# Patient Record
Sex: Male | Born: 1965 | Race: White | Hispanic: No | Marital: Married | Smoking: Never smoker
Health system: Southern US, Community
[De-identification: ages and names within clinical notes are randomized; demographics above are authoritative.]

## PROBLEM LIST (undated history)

## (undated) DIAGNOSIS — I639 Cerebral infarction, unspecified: Secondary | ICD-10-CM

## (undated) DIAGNOSIS — I10 Essential (primary) hypertension: Secondary | ICD-10-CM

## (undated) HISTORY — DX: Cerebral infarction, unspecified: I63.9

## (undated) HISTORY — PX: TYMPANOSTOMY TUBE PLACEMENT: SHX32

---

## 2020-12-17 ENCOUNTER — Encounter (HOSPITAL_BASED_OUTPATIENT_CLINIC_OR_DEPARTMENT_OTHER): Payer: Self-pay | Admitting: *Deleted

## 2020-12-17 ENCOUNTER — Other Ambulatory Visit: Payer: Self-pay

## 2020-12-17 ENCOUNTER — Emergency Department (HOSPITAL_BASED_OUTPATIENT_CLINIC_OR_DEPARTMENT_OTHER): Payer: 59

## 2020-12-17 ENCOUNTER — Emergency Department (HOSPITAL_BASED_OUTPATIENT_CLINIC_OR_DEPARTMENT_OTHER)
Admission: EM | Admit: 2020-12-17 | Discharge: 2020-12-17 | Disposition: A | Discharge status: Home / Self Care | Payer: 59 | Attending: Emergency Medicine | Admitting: Emergency Medicine

## 2020-12-17 DIAGNOSIS — R030 Elevated blood-pressure reading, without diagnosis of hypertension: Secondary | ICD-10-CM | POA: Diagnosis not present

## 2020-12-17 DIAGNOSIS — S161XXA Strain of muscle, fascia and tendon at neck level, initial encounter: Secondary | ICD-10-CM | POA: Diagnosis not present

## 2020-12-17 DIAGNOSIS — S199XXA Unspecified injury of neck, initial encounter: Secondary | ICD-10-CM | POA: Diagnosis present

## 2020-12-17 DIAGNOSIS — S161XXD Strain of muscle, fascia and tendon at neck level, subsequent encounter: Secondary | ICD-10-CM

## 2020-12-17 DIAGNOSIS — Y92410 Unspecified street and highway as the place of occurrence of the external cause: Secondary | ICD-10-CM | POA: Diagnosis not present
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
## 2020-12-17 NOTE — ED Notes (Signed)
Patient transported to CT 
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
## 2021-06-15 ENCOUNTER — Emergency Department (HOSPITAL_COMMUNITY): Payer: 59

## 2021-06-15 ENCOUNTER — Emergency Department (HOSPITAL_COMMUNITY)
Admission: EM | Admit: 2021-06-15 | Discharge: 2021-06-15 | Disposition: A | Discharge status: Other Acute Inpatient Hospital | Payer: 59 | Source: Home / Self Care | Attending: Emergency Medicine | Admitting: Emergency Medicine

## 2021-06-15 ENCOUNTER — Inpatient Hospital Stay (HOSPITAL_COMMUNITY)
Admission: EM | Admit: 2021-06-15 | Discharge: 2021-06-19 | DRG: 064 | Disposition: A | Discharge status: Rehabilitation Facility | Payer: 59 | Attending: Internal Medicine | Admitting: Internal Medicine

## 2021-06-15 ENCOUNTER — Inpatient Hospital Stay (HOSPITAL_COMMUNITY): Payer: 59

## 2021-06-15 ENCOUNTER — Encounter (HOSPITAL_COMMUNITY): Payer: Self-pay | Admitting: Neurology

## 2021-06-15 ENCOUNTER — Other Ambulatory Visit: Payer: Self-pay

## 2021-06-15 DIAGNOSIS — Z20822 Contact with and (suspected) exposure to covid-19: Secondary | ICD-10-CM | POA: Diagnosis present

## 2021-06-15 DIAGNOSIS — I639 Cerebral infarction, unspecified: Secondary | ICD-10-CM | POA: Insufficient documentation

## 2021-06-15 DIAGNOSIS — I6389 Other cerebral infarction: Secondary | ICD-10-CM

## 2021-06-15 DIAGNOSIS — R000 Tachycardia, unspecified: Secondary | ICD-10-CM | POA: Diagnosis present

## 2021-06-15 DIAGNOSIS — E876 Hypokalemia: Secondary | ICD-10-CM | POA: Diagnosis present

## 2021-06-15 DIAGNOSIS — N179 Acute kidney failure, unspecified: Secondary | ICD-10-CM | POA: Diagnosis not present

## 2021-06-15 DIAGNOSIS — I161 Hypertensive emergency: Secondary | ICD-10-CM | POA: Diagnosis present

## 2021-06-15 DIAGNOSIS — Z9114 Patient's other noncompliance with medication regimen: Secondary | ICD-10-CM | POA: Diagnosis not present

## 2021-06-15 DIAGNOSIS — D72829 Elevated white blood cell count, unspecified: Secondary | ICD-10-CM | POA: Diagnosis present

## 2021-06-15 DIAGNOSIS — Z5321 Procedure and treatment not carried out due to patient leaving prior to being seen by health care provider: Secondary | ICD-10-CM | POA: Insufficient documentation

## 2021-06-15 DIAGNOSIS — G936 Cerebral edema: Secondary | ICD-10-CM

## 2021-06-15 DIAGNOSIS — G479 Sleep disorder, unspecified: Secondary | ICD-10-CM | POA: Diagnosis not present

## 2021-06-15 DIAGNOSIS — H5347 Heteronymous bilateral field defects: Secondary | ICD-10-CM | POA: Diagnosis not present

## 2021-06-15 DIAGNOSIS — E7800 Pure hypercholesterolemia, unspecified: Secondary | ICD-10-CM | POA: Diagnosis not present

## 2021-06-15 DIAGNOSIS — E785 Hyperlipidemia, unspecified: Secondary | ICD-10-CM | POA: Diagnosis present

## 2021-06-15 DIAGNOSIS — I629 Nontraumatic intracranial hemorrhage, unspecified: Secondary | ICD-10-CM

## 2021-06-15 DIAGNOSIS — G8191 Hemiplegia, unspecified affecting right dominant side: Secondary | ICD-10-CM | POA: Diagnosis present

## 2021-06-15 DIAGNOSIS — R471 Dysarthria and anarthria: Secondary | ICD-10-CM | POA: Diagnosis present

## 2021-06-15 DIAGNOSIS — R4701 Aphasia: Secondary | ICD-10-CM | POA: Diagnosis present

## 2021-06-15 DIAGNOSIS — Z91199 Patient's noncompliance with other medical treatment and regimen due to unspecified reason: Secondary | ICD-10-CM | POA: Diagnosis not present

## 2021-06-15 DIAGNOSIS — R29711 NIHSS score 11: Secondary | ICD-10-CM | POA: Diagnosis present

## 2021-06-15 DIAGNOSIS — I619 Nontraumatic intracerebral hemorrhage, unspecified: Secondary | ICD-10-CM

## 2021-06-15 DIAGNOSIS — I10 Essential (primary) hypertension: Secondary | ICD-10-CM

## 2021-06-15 DIAGNOSIS — R531 Weakness: Secondary | ICD-10-CM

## 2021-06-15 DIAGNOSIS — I69251 Hemiplegia and hemiparesis following other nontraumatic intracranial hemorrhage affecting right dominant side: Secondary | ICD-10-CM | POA: Diagnosis not present

## 2021-06-15 DIAGNOSIS — I610 Nontraumatic intracerebral hemorrhage in hemisphere, subcortical: Secondary | ICD-10-CM | POA: Diagnosis present

## 2021-06-15 HISTORY — DX: Essential (primary) hypertension: I10

## 2021-06-15 LAB — COMPREHENSIVE METABOLIC PANEL
ALT: 20 U/L (ref 0–44)
ALT: 21 U/L (ref 0–44)
AST: 24 U/L (ref 15–41)
AST: 20 U/L (ref 15–41)
Albumin: 4.2 g/dL (ref 3.5–5.0)
Albumin: 3.8 g/dL (ref 3.5–5.0)
Alkaline Phosphatase: 64 U/L (ref 38–126)
Alkaline Phosphatase: 52 U/L (ref 38–126)
Anion gap: 11 (ref 5–15)
Anion gap: 10 (ref 5–15)
BUN: 15 mg/dL (ref 6–20)
BUN: 14 mg/dL (ref 6–20)
CO2: 22 mmol/L (ref 22–32)
CO2: 22 mmol/L (ref 22–32)
Calcium: 8.6 mg/dL — ABNORMAL LOW (ref 8.9–10.3)
Calcium: 8.2 mg/dL — ABNORMAL LOW (ref 8.9–10.3)
Chloride: 107 mmol/L (ref 98–111)
Chloride: 107 mmol/L (ref 98–111)
Creatinine, Ser: 1.27 mg/dL — ABNORMAL HIGH (ref 0.61–1.24)
Creatinine, Ser: 1.14 mg/dL (ref 0.61–1.24)
GFR, Estimated: >60 mL/min (ref >60)
GFR, Estimated: >60 mL/min (ref >60)
Glucose, Bld: 128 mg/dL — ABNORMAL HIGH (ref 70–99)
Glucose, Bld: 123 mg/dL — ABNORMAL HIGH (ref 70–99)
Potassium: 3.3 mmol/L — ABNORMAL LOW (ref 3.5–5.1)
Potassium: 3.5 mmol/L (ref 3.5–5.1)
Sodium: 140 mmol/L (ref 135–145)
Sodium: 139 mmol/L (ref 135–145)
Total Bilirubin: 1.2 mg/dL (ref 0.3–1.2)
Total Bilirubin: 1.1 mg/dL (ref 0.3–1.2)
Total Protein: 7.1 g/dL (ref 6.5–8.1)
Total Protein: 6.5 g/dL (ref 6.5–8.1)

## 2021-06-15 LAB — I-STAT CHEM 8, ED
BUN: 16 mg/dL (ref 6–20)
BUN: 16 mg/dL (ref 6–20)
Calcium, Ion: 1.03 mmol/L — ABNORMAL LOW (ref 1.15–1.40)
Calcium, Ion: 1.03 mmol/L — ABNORMAL LOW (ref 1.15–1.40)
Chloride: 105 mmol/L (ref 98–111)
Chloride: 105 mmol/L (ref 98–111)
Creatinine, Ser: 1.1 mg/dL (ref 0.61–1.24)
Creatinine, Ser: 1 mg/dL (ref 0.61–1.24)
Glucose, Bld: 126 mg/dL — ABNORMAL HIGH (ref 70–99)
Glucose, Bld: 122 mg/dL — ABNORMAL HIGH (ref 70–99)
HCT: 48 % (ref 39.0–52.0)
HCT: 45 % (ref 39.0–52.0)
Hemoglobin: 16.3 g/dL (ref 13.0–17.0)
Hemoglobin: 15.3 g/dL (ref 13.0–17.0)
Potassium: 3.1 mmol/L — ABNORMAL LOW (ref 3.5–5.1)
Potassium: 3.3 mmol/L — ABNORMAL LOW (ref 3.5–5.1)
Sodium: 140 mmol/L (ref 135–145)
Sodium: 140 mmol/L (ref 135–145)
TCO2: 24 mmol/L (ref 22–32)
TCO2: 25 mmol/L (ref 22–32)

## 2021-06-15 LAB — DIFFERENTIAL
Abs Immature Granulocytes: 0.05 10*3/uL (ref 0.00–0.07)
Abs Immature Granulocytes: 0.03 10*3/uL (ref 0.00–0.07)
Basophils Absolute: 0.1 10*3/uL (ref 0.0–0.1)
Basophils Absolute: 0 10*3/uL (ref 0.0–0.1)
Basophils Relative: 0 %
Basophils Relative: 0 %
Eosinophils Absolute: 0.1 10*3/uL (ref 0.0–0.5)
Eosinophils Absolute: 0.1 10*3/uL (ref 0.0–0.5)
Eosinophils Relative: 1 %
Eosinophils Relative: 1 %
Immature Granulocytes: 0 %
Immature Granulocytes: 0 %
Lymphocytes Relative: 18 %
Lymphocytes Relative: 11 %
Lymphs Abs: 2.5 10*3/uL (ref 0.7–4.0)
Lymphs Abs: 1.1 10*3/uL (ref 0.7–4.0)
Monocytes Absolute: 1.2 10*3/uL — ABNORMAL HIGH (ref 0.1–1.0)
Monocytes Absolute: 0.8 10*3/uL (ref 0.1–1.0)
Monocytes Relative: 9 %
Monocytes Relative: 8 %
Neutro Abs: 10 10*3/uL — ABNORMAL HIGH (ref 1.7–7.7)
Neutro Abs: 7.7 10*3/uL (ref 1.7–7.7)
Neutrophils Relative %: 72 %
Neutrophils Relative %: 80 %

## 2021-06-15 LAB — TROPONIN I (HIGH SENSITIVITY)
Troponin I (High Sensitivity): 6 ng/L (ref <18)
Troponin I (High Sensitivity): 12 ng/L (ref <18)

## 2021-06-15 LAB — TYPE AND SCREEN
ABO/RH(D): O POS
Antibody Screen: NEGATIVE

## 2021-06-15 LAB — URINALYSIS, ROUTINE W REFLEX MICROSCOPIC
Bilirubin Urine: NEGATIVE
Glucose, UA: NEGATIVE mg/dL
Hgb urine dipstick: NEGATIVE
Ketones, ur: NEGATIVE mg/dL
Leukocytes,Ua: NEGATIVE
Nitrite: NEGATIVE
Protein, ur: NEGATIVE mg/dL
Specific Gravity, Urine: 1.01 (ref 1.005–1.030)
pH: 6.5 (ref 5.0–8.0)

## 2021-06-15 LAB — APTT
aPTT: 23 seconds — ABNORMAL LOW (ref 24–36)
aPTT: 24 seconds (ref 24–36)

## 2021-06-15 LAB — CBC
HCT: 48.1 % (ref 39.0–52.0)
HCT: 45 % (ref 39.0–52.0)
Hemoglobin: 16.2 g/dL (ref 13.0–17.0)
Hemoglobin: 15.2 g/dL (ref 13.0–17.0)
MCH: 29.6 pg (ref 26.0–34.0)
MCH: 29.7 pg (ref 26.0–34.0)
MCHC: 33.7 g/dL (ref 30.0–36.0)
MCHC: 33.8 g/dL (ref 30.0–36.0)
MCV: 87.8 fL (ref 80.0–100.0)
MCV: 87.9 fL (ref 80.0–100.0)
Platelets: 208 10*3/uL (ref 150–400)
Platelets: 160 10*3/uL (ref 150–400)
RBC: 5.48 MIL/uL (ref 4.22–5.81)
RBC: 5.12 MIL/uL (ref 4.22–5.81)
RDW: 12.9 % (ref 11.5–15.5)
RDW: 12.9 % (ref 11.5–15.5)
WBC: 13.8 10*3/uL — ABNORMAL HIGH (ref 4.0–10.5)
WBC: 9.7 10*3/uL (ref 4.0–10.5)
nRBC: 0 % (ref 0.0–0.2)
nRBC: 0 % (ref 0.0–0.2)

## 2021-06-15 LAB — HEMOGLOBIN A1C
Hgb A1c MFr Bld: 5.7 % — ABNORMAL HIGH (ref 4.8–5.6)
Mean Plasma Glucose: 116.89 mg/dL

## 2021-06-15 LAB — MAGNESIUM
Magnesium: 2 mg/dL (ref 1.7–2.4)
Magnesium: 2 mg/dL (ref 1.7–2.4)

## 2021-06-15 LAB — RAPID URINE DRUG SCREEN, HOSP PERFORMED
Amphetamines: NOT DETECTED
Barbiturates: NOT DETECTED
Benzodiazepines: NOT DETECTED
Cocaine: NOT DETECTED
Opiates: NOT DETECTED
Tetrahydrocannabinol: NOT DETECTED

## 2021-06-15 LAB — ECHOCARDIOGRAM COMPLETE
AR max vel: 2.79 cm2
AV Area VTI: 2.75 cm2
AV Area mean vel: 2.72 cm2
AV Mean grad: 6 mmHg
AV Peak grad: 10.1 mmHg
Ao pk vel: 1.59 m/s

## 2021-06-15 LAB — LIPID PANEL
Cholesterol: 173 mg/dL (ref 0–200)
HDL: 36 mg/dL — ABNORMAL LOW (ref >40)
LDL Cholesterol: 103 mg/dL — ABNORMAL HIGH (ref 0–99)
Total CHOL/HDL Ratio: 4.8 RATIO
Triglycerides: 170 mg/dL — ABNORMAL HIGH (ref <150)
VLDL: 34 mg/dL (ref 0–40)

## 2021-06-15 LAB — MRSA NEXT GEN BY PCR, NASAL: MRSA by PCR Next Gen: NOT DETECTED

## 2021-06-15 LAB — PROTIME-INR
INR: 0.9 (ref 0.8–1.2)
INR: 1 (ref 0.8–1.2)
Prothrombin Time: 12.6 seconds (ref 11.4–15.2)
Prothrombin Time: 13.3 seconds (ref 11.4–15.2)

## 2021-06-15 LAB — ABO/RH: ABO/RH(D): O POS

## 2021-06-15 LAB — SODIUM: Sodium: 138 mmol/L (ref 135–145)

## 2021-06-15 LAB — RESP PANEL BY RT-PCR (FLU A&B, COVID) ARPGX2
Influenza A by PCR: NEGATIVE
Influenza B by PCR: NEGATIVE
SARS Coronavirus 2 by RT PCR: NEGATIVE

## 2021-06-15 LAB — PHOSPHORUS
Phosphorus: 1.5 mg/dL — ABNORMAL LOW (ref 2.5–4.6)
Phosphorus: 1.4 mg/dL — ABNORMAL LOW (ref 2.5–4.6)

## 2021-06-15 LAB — HIV ANTIBODY (ROUTINE TESTING W REFLEX): HIV Screen 4th Generation wRfx: NONREACTIVE

## 2021-06-15 LAB — ETHANOL: Alcohol, Ethyl (B): <10 mg/dL (ref <10)

## 2021-06-15 LAB — CBG MONITORING, ED: Glucose-Capillary: 127 mg/dL — ABNORMAL HIGH (ref 70–99)

## 2021-06-15 MED ORDER — STROKE: EARLY STAGES OF RECOVERY BOOK
Freq: Once | Status: AC
  Filled 2021-06-15: qty 1

## 2021-06-15 MED ORDER — ACETAMINOPHEN 325 MG PO TABS
650.0000 mg | ORAL_TABLET | ORAL | Status: DC | PRN
  Administered 2021-06-17 – 2021-06-18 (×2): 650 mg via ORAL
  Filled 2021-06-15 (×2): qty 2

## 2021-06-15 MED ORDER — CLEVIDIPINE BUTYRATE 0.5 MG/ML IV EMUL
0.0000 mg/h | INTRAVENOUS | Status: DC
  Administered 2021-06-15: 14:00:00 1 mg/h via INTRAVENOUS
  Filled 2021-06-15: qty 50

## 2021-06-15 MED ORDER — CLEVIDIPINE BUTYRATE 0.5 MG/ML IV EMUL
0.0000 mg/h | INTRAVENOUS | Status: DC
  Administered 2021-06-15: 23:00:00 21 mg/h via INTRAVENOUS
  Administered 2021-06-15 (×3): 20 mg/h via INTRAVENOUS
  Administered 2021-06-15: 13:00:00 2 mg/h via INTRAVENOUS
  Administered 2021-06-15: 23:00:00 21 mg/h via INTRAVENOUS
  Administered 2021-06-15: 14:00:00 8 mg/h via INTRAVENOUS
  Administered 2021-06-16 (×3): 25 mg/h via INTRAVENOUS
  Administered 2021-06-16: 13:00:00 24 mg/h via INTRAVENOUS
  Administered 2021-06-16: 04:00:00 18 mg/h via INTRAVENOUS
  Administered 2021-06-16: 01:00:00 26 mg/h via INTRAVENOUS
  Administered 2021-06-16: 15:00:00 16 mg/h via INTRAVENOUS
  Administered 2021-06-16: 02:00:00 26 mg/h via INTRAVENOUS
  Administered 2021-06-17: 09:00:00 10 mg/h via INTRAVENOUS
  Filled 2021-06-15 (×3): qty 100
  Filled 2021-06-15: qty 50
  Filled 2021-06-15 (×2): qty 100
  Filled 2021-06-15: qty 50
  Filled 2021-06-15 (×2): qty 100
  Filled 2021-06-15 (×2): qty 50
  Filled 2021-06-15 (×3): qty 100
  Filled 2021-06-15: qty 50

## 2021-06-15 MED ORDER — LABETALOL HCL 5 MG/ML IV SOLN
20.0000 mg | Freq: Once | INTRAVENOUS | Status: AC
  Administered 2021-06-15: 13:00:00 20 mg via INTRAVENOUS

## 2021-06-15 MED ORDER — SENNOSIDES-DOCUSATE SODIUM 8.6-50 MG PO TABS
1.0000 | ORAL_TABLET | Freq: Two times a day (BID) | ORAL | Status: DC
  Administered 2021-06-16 – 2021-06-19 (×7): 1 via ORAL
  Filled 2021-06-15 (×8): qty 1

## 2021-06-15 MED ORDER — SODIUM CHLORIDE 3 % IV BOLUS: 250.0000 mL | Freq: Once | INTRAVENOUS | Status: DC

## 2021-06-15 MED ORDER — SODIUM CHLORIDE 3 % IV SOLN
INTRAVENOUS | Status: DC
  Filled 2021-06-15 (×5): qty 500

## 2021-06-15 MED ORDER — CHLORHEXIDINE GLUCONATE CLOTH 2 % EX PADS
6.0000 | MEDICATED_PAD | Freq: Every day | CUTANEOUS | Status: DC
  Administered 2021-06-15 – 2021-06-19 (×4): 6 via TOPICAL

## 2021-06-15 MED ORDER — SODIUM CHLORIDE 3 % IV SOLN
INTRAVENOUS | Status: DC
  Filled 2021-06-15: qty 500

## 2021-06-15 MED ORDER — ACETAMINOPHEN 160 MG/5ML PO SOLN: 650.0000 mg | ORAL | Status: DC | PRN

## 2021-06-15 MED ORDER — IOHEXOL 350 MG/ML SOLN
50.0000 mL | Freq: Once | INTRAVENOUS | Status: AC | PRN
  Administered 2021-06-15: 14:00:00 50 mL via INTRAVENOUS

## 2021-06-15 MED ORDER — ACETAMINOPHEN 650 MG RE SUPP: 650.0000 mg | RECTAL | Status: DC | PRN

## 2021-06-15 MED ORDER — PANTOPRAZOLE SODIUM 40 MG IV SOLR
40.0000 mg | Freq: Every day | INTRAVENOUS | Status: DC
  Administered 2021-06-15: 23:00:00 40 mg via INTRAVENOUS
  Filled 2021-06-15: qty 40

## 2021-06-15 MED ORDER — LABETALOL HCL 5 MG/ML IV SOLN: 20.0000 mg | Freq: Once | INTRAVENOUS | Status: DC

## 2021-06-15 MED ORDER — SODIUM CHLORIDE 0.9% FLUSH: 3.0000 mL | Freq: Once | INTRAVENOUS | Status: DC

## 2021-06-15 MED ORDER — SODIUM CHLORIDE 3 % IV BOLUS
250.0000 mL | Freq: Once | INTRAVENOUS | Status: AC
  Administered 2021-06-15: 14:00:00 250 mL via INTRAVENOUS
  Filled 2021-06-15: qty 500

## 2021-06-15 NOTE — ED Notes (Signed)
Lab called and updated that pts name was wrong and new labs sent

## 2021-06-15 NOTE — ED Provider Notes (Signed)
[place] EMERGENCY DEPARTMENT Provider Note   CSN: [id number] Arrival date & time: 06/15/21  1312  An emergency department physician performed an initial assessment on this suspected stroke patient at 1314.  History Chief Complaint  Patient presents with   Code Stroke    Wayne Mendez is a 55 y.o. male.  Right-sided weakness, slurred speech, visual problems just prior to arrival.  Blood pressure 240/110 with EMS.  History of hypertension but does not take medicine.  Code stroke upon arrival.  The history is provided by the patient, the EMS personnel and a relative.  Neurologic Problem This is a new problem. The current episode started 1 to 2 hours ago. The problem occurs constantly. The problem has not changed since onset.Pertinent negatives include no chest pain, no abdominal pain, no headaches and no shortness of breath. Nothing aggravates the symptoms. Nothing relieves the symptoms. He has tried nothing for the symptoms. The treatment provided no relief.      No past medical history on file.  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  Social History   Tobacco Use   Smoking status: Never   Smokeless tobacco: Never  Substance Use Topics   Alcohol use: Yes    Comment: rare   Drug use: Not Currently    Home Medications Prior to Admission medications   Medication Sig Start Date End Date Taking? Authorizing Provider  ibuprofen (ADVIL) 200 MG tablet Take 400 mg by mouth every 6 (six) hours as needed for fever, headache or mild pain.   Yes [provider]  pseudoephedrine (SUDAFED) 120 MG 12 hr tablet Take 120 mg by mouth 2 (two) times daily as needed for congestion.   Yes [provider]    Allergies    Patient has no known allergies.  Review of Systems    Review of Systems  Constitutional:  Negative for chills and fever.  HENT:  Negative for ear pain and sore throat.   Eyes:  Positive for visual disturbance. Negative for pain.  Respiratory:  Negative for cough and shortness of breath.   Cardiovascular:  Negative for chest pain and palpitations.  Gastrointestinal:  Negative for abdominal pain and vomiting.  Genitourinary:  Negative for dysuria and hematuria.  Musculoskeletal:  Negative for arthralgias and back pain.  Skin:  Negative for color change and rash.  Neurological:  Positive for facial asymmetry, speech difficulty and weakness. Negative for dizziness, seizures, syncope, light-headedness and headaches.  All other systems reviewed and are negative.  Physical Exam Updated Vital Signs BP (!) 141/92    Pulse (!) 102    Temp 98 F (36.7 C) (Oral)    Resp 16    SpO2 97%   Physical Exam Vitals and nursing note reviewed.  Constitutional:      General: He is in acute distress.     Appearance: He is well-developed. He is not ill-appearing.  HENT:     Head: Normocephalic and atraumatic.  Eyes:     Conjunctiva/sclera: Conjunctivae normal.     Pupils: Pupils are equal, round, and reactive to light.  Cardiovascular:     Rate and Rhythm: Normal rate and regular rhythm.     Pulses: Normal pulses.     Heart sounds: Normal heart sounds. No murmur heard. Pulmonary:  Effort: Pulmonary effort is normal. No respiratory distress.     Breath sounds: Normal breath sounds.  Abdominal:     Palpations: Abdomen is soft.     Tenderness: There is no abdominal tenderness.  Musculoskeletal:        General: No swelling.     Cervical back: Normal range of motion and neck supple.  Skin:    General: Skin is warm and dry.     Capillary Refill: Capillary refill takes less than 2 seconds.  Neurological:     Mental Status: He is alert.     Comments: 5+ out of 5 strength in left upper and left lower extremity, slurred speech, right-sided facial droop,  cannot lift his right lower right upper leg up against gravity, suspect right-sided neglect, right-sided hemianopia  Psychiatric:        Mood and Affect: Mood normal.    ED Results / Procedures / Treatments   Labs (all labs ordered are listed, but only abnormal results are displayed) Labs Reviewed  I-STAT CHEM 8, ED - Abnormal; Notable for the following components:      Result Value   Potassium 3.3 (*)    Glucose, Bld 122 (*)    Calcium, Ion 1.03 (*)    All other components within normal limits  RESP PANEL BY RT-PCR (FLU A&B, COVID) ARPGX2  PROTIME-INR  APTT  CBC  DIFFERENTIAL  SODIUM  SODIUM  ETHANOL  COMPREHENSIVE METABOLIC PANEL  RAPID URINE DRUG SCREEN, HOSP PERFORMED  URINALYSIS, ROUTINE W REFLEX MICROSCOPIC  MAGNESIUM  PHOSPHORUS  LIPID PANEL  HEMOGLOBIN A1C  HIV ANTIBODY (ROUTINE TESTING W REFLEX)  MAGNESIUM  PHOSPHORUS  I-STAT CHEM 8, ED  TYPE AND SCREEN  TROPONIN I (HIGH SENSITIVITY)  TROPONIN I (HIGH SENSITIVITY)    EKG None  Radiology No results found.  Procedures .Critical Care Performed by: Virgina Norfolk, DO Authorized by: Virgina Norfolk, DO   Critical care provider statement:    Critical care time (minutes):  45   Critical care was necessary to treat or prevent imminent or life-threatening deterioration of the following conditions:  CNS failure or compromise   Critical care was time spent personally by me on the following activities:  Blood draw for specimens, development of treatment plan with patient or surrogate, discussions with primary provider, evaluation of patient's response to treatment, examination of patient, obtaining history from patient or surrogate, ordering and performing treatments and interventions, ordering and review of laboratory studies, ordering and review of radiographic studies, pulse oximetry, re-evaluation of patient's condition and review of old charts   Care discussed with: admitting provider     Medications Ordered  in ED Medications  labetalol (NORMODYNE) injection 20 mg (20 mg Intravenous Given 06/15/21 1325)    And  clevidipine (CLEVIPREX) infusion 0.5 mg/mL (16 mg/hr Intravenous Infusion Verify 06/15/21 1412)  sodium chloride (hypertonic) 3 % solution ( Intravenous New Bag/Given 06/15/21 1406)  sodium chloride 3% (hypertonic) IV bolus 250 mL (0 mLs Intravenous Stopped 06/15/21 1404)  labetalol (NORMODYNE) injection 20 mg (20 mg Intravenous Not Given 06/15/21 1353)   stroke: mapping our early stages of recovery book (has no administration in time range)  acetaminophen (TYLENOL) tablet 650 mg (has no administration in time range)    Or  acetaminophen (TYLENOL) 160 MG/5ML solution 650 mg (has no administration in time range)    Or  acetaminophen (TYLENOL) suppository 650 mg (has no administration in time range)  pantoprazole (PROTONIX) injection 40 mg (has no administration in time range)  senna-docusate (Senokot-S) tablet 1 tablet (has no administration in time range)    ED Course  I have reviewed the triage vital signs and the nursing notes.  Pertinent labs & imaging results that were available during my care of the patient were reviewed by me and considered in my medical decision making (see chart for details).    MDM Rules/Calculators/A&P                          Wayne Mendez is a 55 year old male with history of hypertension but not on medications who presents the ED as a code stroke.  Blood pressure 240/110 upon arrival.  Per the patient's son patient was at the gym with him when the son received a text from him that did not make sense.  Son found the patient with slurred speech and right-sided weakness.  EMS was called.  He has right-sided weakness, slurred speech, right visual field loss.  Concern for stroke/head bleed.  CT scan was done that shows left-sided head bleed.  No major shift.  CTA did not show ruptured aneurysm.  Patient was started on Cleviprex and given hypertonic saline bolus.   Dr. Theda Sers with neurology will admit the patient to the neurological ICU for further care.  Patient is not on blood thinners.  Family has been made aware of findings.  They are at the bedside.  Lab work is otherwise unremarkable.  Blood pressure improving on Cleviprex drip.  Blood pressure now 140/92.  This chart was dictated using voice recognition software.  Despite best efforts to proofread,  errors can occur which can change the documentation meaning.      Final Clinical Impression(s) / ED Diagnoses Final diagnoses:  Intracranial hemorrhage [facility])  Hypertensive emergency    Rx / DC Orders ED Discharge Orders     None        Lennice Sites, DO 06/15/21 1436

## 2021-06-15 NOTE — ED Triage Notes (Signed)
Pt arrives via EMS at 13:11 under wrong name in epic. Pt code stroke with LSN 11:50 where he was walking into the gym with his son. Pt began having right sided weakness and slurred speech. EMS vitals include: BP 240/110, 94%  Hx of HTN.

## 2021-06-15 NOTE — H&P (Addendum)
Neurology H&P  Wayne Mendez MR# [id number] 06/15/2021  CC: right sided weakness and slurred speech  History is obtained from: EMS and family  HPI: Wayne Mendez is a 55 y.o. male PMHx HTN not taking medications was walking into the gym and suddenly developed right sided weakness and slurred speech.  EMS vitals on the scene: BP 240/110, 94%  LKW: 11:50  tpa given?: No, ICH IR Thrombectomy? No, ICH Modified Rankin Scale: 0-Completely asymptomatic and back to baseline post- stroke NIHSS: 11 LOC Responsiveness 0 LOC Questions 0 LOC Commands 0 Horizontal eye movement 0 Visual field 1 Facial palsy 2 Motor arm - Right arm 3 Motor arm - Left arm 0 Motor leg - Right leg 3 Motor leg - Left leg 0 Limb ataxia 0 Sensory test 0 Language 1 Speech 1 Extinction and inattention 0   ROS: Unable to obtain due to altered mental status.   No family history on file.   Social History:  reports that he has never smoked. He has never used smokeless tobacco. He reports current alcohol use. He reports that he does not currently use drugs.   Prior to Admission medications   Medication Sig Start Date End Date Taking? Authorizing Provider  ibuprofen (ADVIL) 200 MG tablet Take 400 mg by mouth every 6 (six) hours as needed for fever, headache or mild pain.   Yes [provider]  pseudoephedrine (SUDAFED) 120 MG 12 hr tablet Take 120 mg by mouth 2 (two) times daily as needed for congestion.   Yes [provider]    Exam: Current vital signs: BP (!) 150/93    Pulse 92    Temp 98 F (36.7 C) (Oral)    Resp 10    SpO2 93%   Physical Exam  Appears well-developed and well-nourished.  Psych: Affect appropriate to situation Eyes: No scleral injection HENT: No OP obstrucion Head: Normocephalic.  Cardiovascular: Normal rate and regular rhythm.  Respiratory: Effort normal and breath sounds normal to anterior ascultation GI: Soft.  No distension. There is no tenderness.  Skin:  WDI  Neuro: Mental Status: Patient is awake, alert, oriented to person, place, month, year, and situation. Patient is not able to give a clear history. Speech mildly impaired fluency, intact comprehension and impaired repetition. No signs of neglect. Visual Fields right field cut. Pupils are equal, round, and reactive to light. EOMI without ptosis or diploplia.  Facial sensation is symmetric to temperature Facial movement is decreased on right lower.  Hearing is intact to voice. Uvula midline, palate elevates symmetrically. Shoulder shrug is symmetric. Tongue is midline without atrophy or fasciculations.  Tone is normal. Bulk is right extremities 1+/5 strength. Sensation is symmetric to light touch and temperature in the arms and legs. Deep Tendon Reflexes: 2+ and symmetric in the biceps and patellae. Toes are up on right. Gait - Deferred  I have reviewed labs in epic and the pertinent results are: K 3.3  I have reviewed the images obtained: NCT head showed Intraparenchymal hemorrhage in the left basal ganglia and external capsule region, probably a hemorrhagic infarction. Focal hematoma with estimated volume of 23 cc. Mild surrounding edema and mass effect. No intraventricular or subarachnoid penetration. Mild small vessel ischemic changes of the right hemispheric white matter and thalamus. CTA head and neck showed No intracranial large vessel occlusion or stenosis. no aneurysm or vascular malformation.  Assessment: Wayne Mendez is a 55 y.o. male PMHx HTN (untreated) with acute left basal ganglia intraparenchymal hemorrhage.  Impression:  Left  basal ganglia intraparenchymal hemorrhage extending into external capsule. Hypertensive emergency. Acute right sided weakness. Mild aphasia with dysarthria. Right hemianopia. Brain swelling ICH score 0 NIHSS 11   Plan: Elevate head of bed keep head midline.  Blood pressure: MAP >65 SBP 120-160: - Start nicardipine infusion may  increase to maximum 53mcg/min as needed to maintain SBP<140. - Labetalol 20mg  as needed if SBP>140.  3% Na 250cc bolus followed by 75cc/hr infusion - Goal serum Na 145-155. Serum Na every 6 hours.  X-ray chest. Admit to neuro ICU. Consult neurosurgery. Analgosedation (fentanyl). Hold antiplatelets and anticoagulation for now. IV fluids gentle hydration. Repeat CT head in 6 hours (or sooner if clinical worsening). Echocardiogram. MRI brain without contrast when able. Keep platelets >100k, INR<1.4 Replete electrolytes as needed. Labs: Coags, CBC, type and cross, CMP, Mg, Phos, fasting lipids, HbA1c, hCRP, troponins, urinalysis. Maintain O2 sats > 94%. Normothermia - For temperature >37.5C - acetaminophen 650mg  q4-6 hours PRN. Relative euglycemia (~ <180) and treat if hyperglycemia (>200 mg/dL)/hypoglycemia (< 60mg /dL). Euvolemia - Strict I/Os. Precautions: Airway and herniation, seizure, aspiration. PPx: SCDs for now, Senna/docusate, PPI. Precautions: Aspiration/seizure/fall   This patient is critically ill and at significant risk of neurological worsening, death and care requires constant monitoring of vital signs, hemodynamics,respiratory and cardiac monitoring, neurological assessment, discussion with family, other specialists and medical decision making of high complexity. I spent 75 minutes of neurocritical care time  in the care of  this patient. This was time spent independent of any time provided by nurse practitioner or PA.  Electronically signed by:  Lynnae Sandhoff, MD Page: [id number] 06/15/2021, 2:08 PM

## 2021-06-15 NOTE — Code Documentation (Addendum)
Stroke Response Nurse Documentation Code Documentation  Wayne Mendez is a 55 y.o. male arriving to [facility] ED via [facility] on 06/15/21 with past medical hx of hypertension. Does not take any medications. Code stroke was activated by EMS. BP 240/110, HR 98, CBG 93 with EMS.  Patient from the gym where he was LKW at 1100. While at gym, he texted his son but the texts did not make sense. Son was concerned and checked on him around 1150 and called 911. Patient vomited en route. Given Zofran.   Stroke team at the bedside on patient arrival. Labs drawn and patient cleared for CT by Dr. Lockie Mola. Patient to CT with team. NIHSS 18, see documentation for details and code stroke times. Patient with right hemianopia, right facial droop, right arm weakness, right leg weakness, right decreased sensation, Expressive aphasia , dysarthria , and Sensory  neglect on exam.   The following imaging was completed: CT, CTA head and neck. Patient is not a candidate for IV Thrombolytic due to hemorrhage. Elevated BP treated with labetalol followed by Cleviprex drip. Hypertonic saline bolus given.  Care/Plan: Admit to ICU; hourly VS and neuro checks; Cleviprex drip for BP control.   Bedside handoff with ED RN Seward Grater.    Ferman Hamming Stroke Response RN

## 2021-06-15 NOTE — Progress Notes (Signed)
BP 160/100 (116) despite Cleviprex at max rate of 21/hr.  VO from dr Otelia Limes to increase ceiling to 30/hr.

## 2021-06-16 ENCOUNTER — Inpatient Hospital Stay (HOSPITAL_COMMUNITY): Payer: 59

## 2021-06-16 ENCOUNTER — Encounter (HOSPITAL_COMMUNITY): Payer: Self-pay | Admitting: Neurology

## 2021-06-16 LAB — SODIUM
Sodium: 143 mmol/L (ref 135–145)
Sodium: 144 mmol/L (ref 135–145)

## 2021-06-16 MED ORDER — GADOBUTROL 1 MMOL/ML IV SOLN
10.0000 mL | Freq: Once | INTRAVENOUS | Status: AC | PRN
  Administered 2021-06-16: 17:00:00 10 mL via INTRAVENOUS

## 2021-06-16 MED ORDER — POTASSIUM PHOSPHATES 15 MMOLE/5ML IV SOLN
30.0000 mmol | Freq: Once | INTRAVENOUS | Status: AC
  Administered 2021-06-16: 14:00:00 30 mmol via INTRAVENOUS
  Filled 2021-06-16: qty 10

## 2021-06-16 MED ORDER — SODIUM CHLORIDE 3 % IV SOLN: INTRAVENOUS | Status: DC

## 2021-06-16 MED ORDER — METOPROLOL TARTRATE 50 MG PO TABS
50.0000 mg | ORAL_TABLET | Freq: Two times a day (BID) | ORAL | Status: DC
  Administered 2021-06-16 – 2021-06-17 (×2): 50 mg via ORAL
  Filled 2021-06-16 (×3): qty 1

## 2021-06-16 MED ORDER — SODIUM CHLORIDE 0.9 % IV SOLN: INTRAVENOUS | Status: DC

## 2021-06-16 MED ORDER — LISINOPRIL 20 MG PO TABS
20.0000 mg | ORAL_TABLET | Freq: Two times a day (BID) | ORAL | Status: DC
  Administered 2021-06-16 – 2021-06-19 (×7): 20 mg via ORAL
  Filled 2021-06-16 (×8): qty 1

## 2021-06-16 MED ORDER — AMLODIPINE BESYLATE 10 MG PO TABS
10.0000 mg | ORAL_TABLET | Freq: Every day | ORAL | Status: DC
  Administered 2021-06-16 – 2021-06-19 (×4): 10 mg via ORAL
  Filled 2021-06-16 (×5): qty 1

## 2021-06-16 MED ORDER — PANTOPRAZOLE SODIUM 40 MG PO TBEC
40.0000 mg | DELAYED_RELEASE_TABLET | Freq: Every day | ORAL | Status: DC
  Administered 2021-06-16 – 2021-06-19 (×4): 40 mg via ORAL
  Filled 2021-06-16 (×5): qty 1

## 2021-06-16 MED ORDER — WHITE PETROLATUM EX OINT
TOPICAL_OINTMENT | CUTANEOUS | Status: AC
  Filled 2021-06-16: qty 28.35

## 2021-06-16 NOTE — Progress Notes (Signed)
°  Transition of Care [facility]) Screening Note   Patient Details  Name: Wayne Mendez Date of Birth: 05-Apr-1966   Transition of Care [facility]) CM/SW Contact:    Baldemar Lenis, LCSW Phone Number: 06/16/2021, 2:16 PM    Transition of Care Department [facility]) has reviewed patient and no TOC needs have been identified at this time; medical workup ongoing. We will continue to monitor patient advancement through interdisciplinary progression rounds. If new patient transition needs arise, please place a TOC consult.

## 2021-06-16 NOTE — Evaluation (Signed)
Occupational Therapy Evaluation Patient Details Name: Wayne Mendez MRN: [id number] DOB: Feb 21, 1966 Today's Date: 06/16/2021   History of Present Illness Pt is 55 yo male who developed sudden R sided weakness and slurred speech walking into the gym on 06/15/21. 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  PMH: HTN.   Clinical Impression   PTA pt lives independently with his wife and son and works (computers/IT?).  Currently requires Max A +2 for stand pivot transfer and Max A with LB ADL tasks at this time due to below listed deficits. Pt impulsive with decreased awareness of deficits. Excellent candidate for intensive rehab at [facility]. Will follow acutely.      Recommendations for follow up therapy are one component of a multi-disciplinary discharge planning process, led by the attending physician.  Recommendations may be updated based on patient status, additional functional criteria and insurance authorization.   Follow Up Recommendations  Acute inpatient rehab (3hours/day)    Assistance Recommended at Discharge Frequent or constant Supervision/Assistance  Functional Status Assessment  Patient has had a recent decline in their functional status and demonstrates the ability to make significant improvements in function in a reasonable and predictable amount of time.  Equipment Recommendations       Recommendations for Other Services Rehab consult     Precautions / Restrictions Precautions Precautions: Fall Precaution Comments: strict BP under 150 systolic      Mobility Bed Mobility Overal bed mobility: Needs Assistance Bed Mobility: Supine to Sit     Supine to sit: Min assist;+2 for safety/equipment     General bed mobility comments: heavy use of bedreails with HOB elevated; impusively sat up and pulled self to EOB; good problem solving during bed mobility with hos to mobilize to EOB    Transfers Overall transfer level: Needs assistance Equipment used: 2  person hand held assist Transfers: Sit to/from Stand;Bed to chair/wheelchair/BSC Sit to Stand: Mod assist;+2 physical assistance     Step pivot transfers: Max assist;+2 physical assistance (no initiation of moving R leg)            Balance Overall balance assessment: Needs assistance   Sitting balance-Leahy Scale: Poor Sitting balance - Comments: R/posteriero lean at times; unaware of falling R when putting on sock     Standing balance-Leahy Scale: Zero                             ADL either performed or assessed with clinical judgement   ADL Overall ADL's : Needs assistance/impaired Eating/Feeding: Minimal assistance Eating/Feeding Details (indicate cue type and reason): dys 2; honey thick liquids Grooming: Moderate assistance   Upper Body Bathing: Moderate assistance   Lower Body Bathing: Moderate assistance;Bed level;Sitting/lateral leans   Upper Body Dressing : Moderate assistance   Lower Body Dressing: Maximal assistance         Toileting - Clothing Manipulation Details (indicate cue type and reason): using urinal; max A     Functional mobility during ADLs: Maximal assistance;+2 for physical assistance       Vision   Additional Comments: States he does not wear glasses however squinting when trying to read nametag; no over/undershooitng when reaching for targets in all fields; blinks to threat; will further assess     Perception     Praxis      Pertinent Vitals/Pain Pain Assessment: No/denies pain     Hand Dominance Left   Extremity/Trunk Assessment Upper Extremity Assessment Upper Extremity Assessment:  RUE deficits/detail RUE Deficits / Details: flaccid; no movemetn; no response to noxious stimuli; unaware of posiiton of arm during mobility; monitor for need for splinting RUE Sensation: decreased light touch;decreased proprioception RUE Coordination: decreased fine motor;decreased gross motor   Lower Extremity Assessment Lower  Extremity Assessment: Defer to PT evaluation   Cervical / Trunk Assessment Cervical / Trunk Assessment: Other exceptions (R bias)   Communication Communication Communication: Receptive difficulties;Expressive difficulties   Cognition Arousal/Alertness: Awake/alert Behavior During Therapy: Impulsive Overall Cognitive Status: Difficult to assess Area of Impairment: Attention;Safety/judgement;Awareness                   Current Attention Level: Selective     Safety/Judgement: Decreased awareness of safety;Decreased awareness of deficits Awareness: Emergent   General Comments: impulsively trying to stand; responds "no" when asked if it was difficult to get to the chair (required Max A +2; no awareness of RLE buckling; responds "yes" when PT asked if he could walk to the bathroom at this time; Posey seat belt used     General Comments  VSS within parameters - Nsg aware of BP @ 150; Max HR 128    Exercises     Shoulder Instructions      Home Living Family/patient expects to be discharged to:: Private residence Living Arrangements: Spouse/significant other;Children Available Help at Discharge: Available PRN/intermittently;Family Type of Home: House Home Access: Stairs to enter     Home Layout: One level     Bathroom Shower/Tub: Estate manager/land agent Accessibility: Yes   Home Equipment: None      Lives With: Spouse;Son (need to confirm info)    Prior Functioning/Environment Prior Level of Function : Working/employed;Driving;Independent/Modified Independent                        OT Problem List: Decreased strength;Decreased range of motion;Impaired balance (sitting and/or standing);Impaired vision/perception;Decreased coordination;Decreased cognition;Decreased safety awareness;Decreased knowledge of use of DME or AE;Cardiopulmonary status limiting activity;Impaired sensation;Impaired tone;Impaired UE functional use      OT  Treatment/Interventions: Self-care/ADL training;Therapeutic exercise;Neuromuscular education;DME and/or AE instruction;Therapeutic activities;Splinting;Cognitive remediation/compensation;Visual/perceptual remediation/compensation;Patient/family education;Balance training    OT Goals(Current goals can be found in the care plan section) Acute Rehab OT Goals Patient Stated Goal: pt unablet o state at this time OT Goal Formulation: Patient unable to participate in goal setting Time For Goal Achievement: 06/30/21 Potential to Achieve Goals: Good  OT Frequency: Min 2X/week   Barriers to D/C:            Co-evaluation PT/OT/SLP Co-Evaluation/Treatment: Yes Reason for Co-Treatment: Complexity of the patient's impairments (multi-system involvement);For patient/therapist safety;To address functional/ADL transfers   OT goals addressed during session: ADL's and self-care      AM-PAC OT "6 Clicks" Daily Activity     Outcome Measure Help from another person eating meals?: A Lot Help from another person taking care of personal grooming?: A Lot Help from another person toileting, which includes using toliet, bedpan, or urinal?: A Lot Help from another person bathing (including washing, rinsing, drying)?: A Lot Help from another person to put on and taking off regular upper body clothing?: A Lot Help from another person to put on and taking off regular lower body clothing?: A Lot 6 Click Score: 12   End of Session Equipment Utilized During Treatment: Gait belt Nurse Communication: Mobility status;Need for lift equipment Antony Salmon)  Activity Tolerance: Patient tolerated treatment well Patient left: in chair;with call bell/phone within reach;with bed alarm  set (seat belt alarm)  OT Visit Diagnosis: Unsteadiness on feet (R26.81);Other abnormalities of gait and mobility (R26.89);Muscle weakness (generalized) (M62.81);Apraxia (R48.2);Other symptoms and signs involving cognitive function;Cognitive  communication deficit (R41.841);Hemiplegia and hemiparesis Symptoms and signs involving cognitive functions: Nontraumatic intracerebral hemorrhage Hemiplegia - Right/Left: Right Hemiplegia - dominant/non-dominant: Non-Dominant Hemiplegia - caused by: Nontraumatic intracerebral hemorrhage                Time: 1350-1420 OT Time Calculation (min): 30 min Charges:  OT General Charges $OT Visit: 1 Visit OT Evaluation $OT Eval Moderate Complexity: 1 Mod  Siham Bucaro, OT/L   Acute OT Clinical Specialist Acute Rehabilitation Services Pager [phone] Office [phone]   [facility] 06/16/2021, 2:47 PM
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
## 2021-06-16 NOTE — Progress Notes (Signed)
OT Cancellation Note  Patient Details Name: Wayne Mendez MRN: [id number] DOB: Apr 06, 1966   Cancelled Treatment:    Reason Eval/Treat Not Completed: Active bedrest order (Will assess when activity orders updated. thanks)  Burnett Corrente Kayvion Arneson, OT/L   Acute OT Clinical Specialist Acute Rehabilitation Services Pager [phone] Office [phone]  06/16/2021, 8:02 AM

## 2021-06-16 NOTE — Evaluation (Signed)
Speech Language Pathology Evaluation Patient Details Name: Wayne Mendez MRN: [id number] DOB: Mar 24, 1966 Today's Date: 06/16/2021 Time: 1245-8099 SLP Time Calculation (min) (ACUTE ONLY): 38 min  Problem List:  Patient Active Problem List   Diagnosis Date Noted   Stroke, hemorrhagic (HCC) 06/15/2021   Past Medical History:  Past Medical History:  Diagnosis Date   Hypertension    Past Surgical History:  Past Surgical History:  Procedure Laterality Date   TYMPANOSTOMY TUBE PLACEMENT     HPI:  Wayne Mendez is a 55 y.o. male PMHx HTN (untreated) with acute left basal ganglia intraparenchymal hemorrhage.   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 Pt is able to repeat words and short phrases, but make semantic and phonemic errors with full sentence repetition, naming, connected speech. He can make corrections with a verbal cue that he made an error, but often needs a further phonemic or semantic cue. There are then some slight vowel distortions. Pt additionally has receptive errors, poorly accurate with complex Y/N questions and multistep directions. With proximal commands pt has apraxic errors. Pts prosody and rate of speech are also distorted. Pt appears impulsive with functional tasks. Provided some introductory education to family. Recommend [facility] at d/c, pt would greatly benefit from intensive speech therapy as he is highly stimulable and motivated.    SLP Assessment  SLP Recommendation/Assessment: Patient needs continued Speech Lanaguage Pathology Services SLP Visit Diagnosis: Aphasia (R47.01)    Recommendations for follow up therapy are one component of a multi-disciplinary discharge planning process, led by the attending physician.  Recommendations may be updated based on patient status, additional functional criteria and insurance authorization.    Follow Up Recommendations  Acute  inpatient rehab (3hours/day)    Assistance Recommended at Discharge     Functional Status Assessment Patient has had a recent decline in their functional status and demonstrates the ability to make significant improvements in function in a reasonable and predictable amount of time.  Frequency and Duration min 2x/week  2 weeks      SLP Evaluation Cognition  Overall Cognitive Status: Impaired/Different from baseline Arousal/Alertness: Awake/alert Orientation Level: Oriented to person;Disoriented to place;Disoriented to time Attention: Focused;Sustained Focused Attention: Appears intact Sustained Attention: Appears intact Awareness: Impaired Awareness Impairment: Intellectual impairment;Emergent impairment Problem Solving: Impaired Problem Solving Impairment: Functional basic;Verbal basic Safety/Judgment: Impaired       Comprehension  Auditory Comprehension Overall Auditory Comprehension: Impaired Yes/No Questions: Impaired Basic Biographical Questions: 76-100% accurate Basic Immediate Environment Questions: 50-74% accurate Complex Questions: 0-24% accurate Commands: Impaired One Step Basic Commands: 50-74% accurate Multistep Basic Commands: 0-24% accurate Conversation: Simple Reading Comprehension Reading Status: Not tested    Expression Verbal Expression Overall Verbal Expression: Impaired Initiation: No impairment Automatic Speech: Name;Social Response Level of Generative/Spontaneous Verbalization: Phrase;Sentence;Conversation Repetition: Impaired Level of Impairment: Sentence level;Phrase level Naming: Impairment Responsive: Not tested Confrontation: Impaired Convergent: 25-49% accurate Divergent: Not tested Verbal Errors: Neologisms;Phonemic paraphasias;Semantic paraphasias;Not aware of errors Pragmatics: Impairment Impairments: Dysprosody;Abnormal affect   Oral / Motor  Oral Motor/Sensory Function Overall Oral Motor/Sensory Function: Moderate  impairment Facial ROM: Reduced right;Suspected CN VII (facial) dysfunction Facial Symmetry: Abnormal symmetry right;Suspected CN VII (facial) dysfunction Facial Strength: Reduced right;Suspected CN VII (facial) dysfunction Facial Sensation: Reduced right;Suspected CN V (Trigeminal) dysfunction Lingual ROM: Within Functional Limits Lingual Symmetry: Abnormal symmetry right;Suspected CN XII (hypoglossal) dysfunction Lingual Strength: Within Functional Limits Motor Speech Overall Motor Speech: Impaired Respiration: Within functional limits Phonation: Normal Resonance: Within  functional limits Articulation: Impaired Level of Impairment: Word Intelligibility: Intelligible Motor Planning: Impaired Level of Impairment: Word Motor Speech Errors: Unaware;Inconsistent;Groping for words            Wayne Mendez, Wayne Mendez 06/16/2021, 12:04 PM

## 2021-06-16 NOTE — Progress Notes (Signed)
PT Cancellation Note  Patient Details Name: Paula Zietz MRN: [id number] DOB: 04/23/1966   Cancelled Treatment:    Reason Eval/Treat Not Completed: Active bedrest order (will follow when activity orders advanced).  Lyanne Co, PT  Acute Rehab Services  Pager [phone] Office [phone]    Lawana Chambers Geniyah Eischeid 06/16/2021, 8:31 AM

## 2021-06-16 NOTE — Evaluation (Addendum)
Clinical/Bedside Swallow Evaluation Patient Details  Name: Wayne Mendez MRN: [id number] Date of Birth: 22-Jul-1965  Today's Date: 06/16/2021 Time: SLP Start Time (ACUTE ONLY): 1020 SLP Stop Time (ACUTE ONLY): 1058 SLP Time Calculation (min) (ACUTE ONLY): 38 min  Past Medical History:  Past Medical History:  Diagnosis Date   Hypertension    Past Surgical History:  Past Surgical History:  Procedure Laterality Date   TYMPANOSTOMY TUBE PLACEMENT     HPI:  Wayne Mendez is a 55 y.o. male PMHx HTN (untreated) with acute left basal ganglia intraparenchymal hemorrhage.    Assessment / Plan / Recommendation  Clinical Impression  Pt demonstrates signs of dysphagia including right lingual deviation and right facial sensory changes. When given sips of thin liquids pt has immediate coughing with all but very small sips. Pt intially able to tolerate nectar thick liquids without coughing with single sips, but when drinking freely again had immediate and delayed coughing. Pt able to drink honey thick liquids without coughing. SLP will need MBS to determine best diet, but for now can sip on honey thick liquids, soft dys 2 solids and meds whole in puree. SLP will f/u tomorrow with MBS. SLP Visit Diagnosis: Dysphagia, oropharyngeal phase (R13.12)    Aspiration Risk  Moderate aspiration risk    Diet Recommendation Dysphagia 2 (Fine chop);Honey-thick liquid   Liquid Administration via: Cup;Straw Medication Administration: Crushed with puree Supervision: Patient able to self feed Compensations: Slow rate;Small sips/bites Postural Changes: Seated upright at 90 degrees    Other  Recommendations      Recommendations for follow up therapy are one component of a multi-disciplinary discharge planning process, led by the attending physician.  Recommendations may be updated based on patient status, additional functional criteria and insurance authorization.  Follow up Recommendations Acute inpatient rehab  (3hours/day)      Assistance Recommended at Discharge    Functional Status Assessment Patient has had a recent decline in their functional status and demonstrates the ability to make significant improvements in function in a reasonable and predictable amount of time.  Frequency and Duration min 2x/week  2 weeks       Prognosis        Swallow Study   General HPI: Wayne Mendez is a 55 y.o. male PMHx HTN (untreated) with acute left basal ganglia intraparenchymal hemorrhage. Type of Study: Bedside Swallow Evaluation Diet Prior to this Study: NPO Temperature Spikes Noted: No Respiratory Status: Room air History of Recent Intubation: No Behavior/Cognition: Alert;Cooperative;Pleasant mood Self-Feeding Abilities: Needs assist Patient Positioning: Upright in bed Baseline Vocal Quality: Normal Volitional Cough: Strong Volitional Swallow: Able to elicit    Oral/Motor/Sensory Function   Ice Chips     Thin Liquid Thin Liquid: Impaired Presentation: Cup Pharyngeal  Phase Impairments: Cough - Immediate    Nectar Thick Nectar Thick Liquid: Impaired Presentation: Cup Pharyngeal Phase Impairments: Cough - Immediate;Cough - Delayed   Honey Thick Honey Thick Liquid: Within functional limits   Puree Puree: Impaired Presentation: Spoon Oral Phase Functional Implications: Oral residue;Right anterior spillage   Solid     Solid: Impaired Oral Phase Impairments: Reduced lingual movement/coordination Oral Phase Functional Implications: Oral residue      Wayne Mendez, [name] 06/16/2021,11:44 AM

## 2021-06-16 NOTE — Evaluation (Signed)
Physical Therapy Evaluation Patient Details Name: Wayne Mendez MRN: [id number] DOB: 06/21/1966 Today's Date: 06/16/2021  History of Present Illness  Pt is 55 yo male who developed sudden R sided weakness and slurred speech walking into the gym on 06/15/21. 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  PMH: HTN.  Clinical Impression  Pt admitted with above diagnosis. Pt from home where he was independent, working full time, appears to have been very active. Pt very impulsive on eval with decreased awareness of deficits and safety limitations. Pt follows commands when given directly with short phrases and 1 step at a time. Pt able to pull self into long sitting with bedrails and LUE as well as using LUE to move RLE off EOB to sit EOB. No active mvmt of LLE noted on eval and L knee buckling with transfer to chair. Max A +2 needed to pivot to chair with pt maintaining R lean and having difficulty wt shifting to L. Pt excellent candidate for intense inpt rehab environment.   Pt currently with functional limitations due to the deficits listed below (see PT Problem List). Pt will benefit from skilled PT to increase their independence and safety with mobility to allow discharge to the venue listed below.          Recommendations for follow up therapy are one component of a multi-disciplinary discharge planning process, led by the attending physician.  Recommendations may be updated based on patient status, additional functional criteria and insurance authorization.  Follow Up Recommendations Acute inpatient rehab (3hours/day)    Assistance Recommended at Discharge Frequent or constant Supervision/Assistance  Functional Status Assessment Patient has had a recent decline in their functional status and demonstrates the ability to make significant improvements in function in a reasonable and predictable amount of time.  Equipment Recommendations  Other (comment) (TBD)    Recommendations for  Other Services Rehab consult     Precautions / Restrictions Precautions Precautions: Fall Precaution Comments: strict BP under 150 systolic Restrictions Weight Bearing Restrictions: No      Mobility  Bed Mobility Overal bed mobility: Needs Assistance Bed Mobility: Supine to Sit     Supine to sit: Min assist;+2 for safety/equipment     General bed mobility comments: heavy use of bedreails with HOB elevated; impusively sat up and pulled self to EOB; is quickly problem solving to make up for R side not working, grabbing RLE with L hand and moving it off EOB.    Transfers Overall transfer level: Needs assistance Equipment used: 2 person hand held assist Transfers: Sit to/from Stand;Bed to chair/wheelchair/BSC Sit to Stand: Mod assist;+2 physical assistance   Step pivot transfers: Max assist;+2 physical assistance (no initiation of moving R leg)       General transfer comment: R knee buckling, heavy R lean, difficulty wt shifting to L, needed facilitation at hips to move into chair on his L.    Ambulation/Gait               General Gait Details: unable to perform safely at this time  Stairs            Wheelchair Mobility    Modified Rankin (Stroke Patients Only) Modified Rankin (Stroke Patients Only) Pre-Morbid Rankin Score: No symptoms Modified Rankin: Severe disability     Balance Overall balance assessment: Needs assistance Sitting-balance support: Feet supported;Single extremity supported Sitting balance-Leahy Scale: Poor Sitting balance - Comments: R/posteriero lean at times; unaware of falling R when putting on sock   Standing  balance support: Bilateral upper extremity supported;During functional activity Standing balance-Leahy Scale: Zero Standing balance comment: max A +2 to maintain standing                             Pertinent Vitals/Pain Pain Assessment: No/denies pain    Home Living Family/patient expects to be  discharged to:: Private residence Living Arrangements: Spouse/significant other;Children Available Help at Discharge: Available PRN/intermittently;Family Type of Home: House Home Access: Stairs to enter       Home Layout: One level Home Equipment: None Additional Comments: need to confirm info due to pt's cognition and aphasia    Prior Function Prior Level of Function : Working/employed;Driving;Independent/Modified Independent                     Hand Dominance   Dominant Hand: Left    Extremity/Trunk Assessment   Upper Extremity Assessment Upper Extremity Assessment: Defer to OT evaluation RUE Deficits / Details: flaccid; no movemetn; no response to noxious stimuli; unaware of posiiton of arm during mobility RUE Sensation: decreased light touch;decreased proprioception RUE Coordination: decreased fine motor;decreased gross motor    Lower Extremity Assessment Lower Extremity Assessment: RLE deficits/detail RLE Deficits / Details: no noted active mvmt of RLE, R knee buckling in standing, decreased sensation and joint sense noted RLE Sensation: decreased light touch;decreased proprioception RLE Coordination: decreased fine motor;decreased gross motor    Cervical / Trunk Assessment Cervical / Trunk Assessment: Other exceptions (R bias)  Communication   Communication: Receptive difficulties;Expressive difficulties  Cognition Arousal/Alertness: Awake/alert Behavior During Therapy: Impulsive Overall Cognitive Status: Impaired/Different from baseline Area of Impairment: Attention;Safety/judgement;Awareness                   Current Attention Level: Selective     Safety/Judgement: Decreased awareness of safety;Decreased awareness of deficits Awareness: Emergent   General Comments: very impulsive, decreased awareness of deficits and safety. Often times quickly answers "yes" or "no" without that being the appropriate answer but will do better when cued to slow  down and think about the question before answering. Some speech unintelligible        General Comments General comments (skin integrity, edema, etc.): HR 128 bpm with mobility. Maintain BP parameters of SBP 150    Exercises     Assessment/Plan    PT Assessment Patient needs continued PT services  PT Problem List Decreased strength;Decreased mobility;Decreased coordination;Decreased cognition;Decreased balance;Decreased activity tolerance;Decreased range of motion;Decreased knowledge of use of DME;Decreased safety awareness;Decreased knowledge of precautions;Cardiopulmonary status limiting activity;Impaired sensation;Impaired tone       PT Treatment Interventions DME instruction;Gait training;Stair training;Functional mobility training;Therapeutic activities;Therapeutic exercise;Balance training;Neuromuscular re-education;Cognitive remediation;Patient/family education;Wheelchair mobility training    PT Goals (Current goals can be found in the Care Plan section)  Acute Rehab PT Goals Patient Stated Goal: return of independence PT Goal Formulation: With patient Time For Goal Achievement: 06/30/21 Potential to Achieve Goals: Good    Frequency Min 4X/week   Barriers to discharge        Co-evaluation PT/OT/SLP Co-Evaluation/Treatment: Yes Reason for Co-Treatment: Complexity of the patient's impairments (multi-system involvement);Necessary to address cognition/behavior during functional activity;For patient/therapist safety PT goals addressed during session: Mobility/safety with mobility;Balance OT goals addressed during session: ADL's and self-care       AM-PAC PT "6 Clicks" Mobility  Outcome Measure Help needed turning from your back to your side while in a flat bed without using bedrails?: A Little Help needed moving from  lying on your back to sitting on the side of a flat bed without using bedrails?: A Lot Help needed moving to and from a bed to a chair (including a  wheelchair)?: Total Help needed standing up from a chair using your arms (e.g., wheelchair or bedside chair)?: Total Help needed to walk in hospital room?: Total Help needed climbing 3-5 steps with a railing? : Total 6 Click Score: 9    End of Session Equipment Utilized During Treatment: Gait belt Activity Tolerance: Patient tolerated treatment well Patient left: in chair;with chair alarm set;with call bell/phone within reach (posey chair alarm) Nurse Communication: Mobility status PT Visit Diagnosis: Unsteadiness on feet (R26.81);Hemiplegia and hemiparesis;Difficulty in walking, not elsewhere classified (R26.2) Hemiplegia - Right/Left: Right Hemiplegia - dominant/non-dominant: Non-dominant Hemiplegia - caused by: Nontraumatic intracerebral hemorrhage    Time: 1352-1420 PT Time Calculation (min) (ACUTE ONLY): 28 min   Charges:   PT Evaluation $PT Eval Moderate Complexity: 1 Mod          Lyanne Co, PT  Acute Rehab Services  Pager [phone] Office [phone]   Lawana Chambers Deserae Jennings 06/16/2021, 4:49 PM

## 2021-06-16 NOTE — TOC Progression Note (Signed)
Transition of Care [facility]) - Progression Note    Patient Details  Name: Wayne Mendez MRN: [id number] Date of Birth: 04-Oct-1965  [facility] CM/SW Contact  Tom-Johnson, Hershal Coria, RN Phone Number: 06/16/2021, 3:17 PM  Clinical Narrative:     CM consulted for PCP needs. CM spoke with patient and wife, Wayne Mendez at bedside. Wayne Mendez states she was trying to get patient enrolled with her PCP at [facility] but they do not have immediate opening. Requesting a PCP for followup. CM informed Wayne Mendez that PCP offices are not accepting new patients till after out. CM gave her a list of clinics that patient can go for followup while she is waiting to be seen by the PCP at [facility]. Patient agreed for CM to schedule appointment with [facility]. CM called and scheduled followup appointment and information is on AVS. Wayne Mendez stated patient will possible transition to rehab. CM told her they can cancel or reschedule appointment if patient is still in rehab on appointment date. CM will continue to follow with needs.        Expected Discharge Plan and Services                                                 Social Determinants of Health (SDOH) Interventions    Readmission Risk Interventions No flowsheet data found.

## 2021-06-16 NOTE — Progress Notes (Signed)
Inpatient Rehab Admissions Coordinator:  ? ?Per therapy recommendations,  patient was screened for CIR candidacy by Colette Dicamillo, MS, CCC-SLP. At this time, Pt. Appears to be a a potential candidate for CIR. I will place   order for rehab consult per protocol for full assessment. Please contact me any with questions. ? ?Montez Stryker, MS, CCC-SLP ?Rehab Admissions Coordinator  ?[phone] (celll) ?[phone] (office) ? ?

## 2021-06-17 ENCOUNTER — Inpatient Hospital Stay (HOSPITAL_COMMUNITY): Payer: 59

## 2021-06-17 LAB — CBC
HCT: 45.3 % (ref 39.0–52.0)
Hemoglobin: 15.2 g/dL (ref 13.0–17.0)
MCH: 29.5 pg (ref 26.0–34.0)
MCHC: 33.6 g/dL (ref 30.0–36.0)
MCV: 87.8 fL (ref 80.0–100.0)
Platelets: 168 10*3/uL (ref 150–400)
RBC: 5.16 MIL/uL (ref 4.22–5.81)
RDW: 13.8 % (ref 11.5–15.5)
WBC: 13.1 10*3/uL — ABNORMAL HIGH (ref 4.0–10.5)
nRBC: 0 % (ref 0.0–0.2)

## 2021-06-17 LAB — BASIC METABOLIC PANEL
Anion gap: 12 (ref 5–15)
BUN: 8 mg/dL (ref 6–20)
CO2: 22 mmol/L (ref 22–32)
Calcium: 8.4 mg/dL — ABNORMAL LOW (ref 8.9–10.3)
Chloride: 109 mmol/L (ref 98–111)
Creatinine, Ser: 1.01 mg/dL (ref 0.61–1.24)
GFR, Estimated: >60 mL/min (ref >60)
Glucose, Bld: 105 mg/dL — ABNORMAL HIGH (ref 70–99)
Potassium: 3.5 mmol/L (ref 3.5–5.1)
Sodium: 143 mmol/L (ref 135–145)

## 2021-06-17 LAB — PHOSPHORUS: Phosphorus: 2.6 mg/dL (ref 2.5–4.6)

## 2021-06-17 MED ORDER — ALBUTEROL SULFATE (2.5 MG/3ML) 0.083% IN NEBU
INHALATION_SOLUTION | RESPIRATORY_TRACT | Status: AC
  Filled 2021-06-17: qty 6

## 2021-06-17 MED ORDER — METOPROLOL TARTRATE 50 MG PO TABS
75.0000 mg | ORAL_TABLET | Freq: Two times a day (BID) | ORAL | Status: DC
  Administered 2021-06-17 – 2021-06-19 (×4): 75 mg via ORAL
  Filled 2021-06-17 (×4): qty 1

## 2021-06-17 MED ORDER — ALBUTEROL SULFATE (2.5 MG/3ML) 0.083% IN NEBU
INHALATION_SOLUTION | RESPIRATORY_TRACT | Status: AC
  Filled 2021-06-17: qty 3

## 2021-06-17 MED ORDER — LABETALOL HCL 5 MG/ML IV SOLN
10.0000 mg | INTRAVENOUS | Status: DC | PRN
Start: 2021-06-17 — End: 2021-06-19

## 2021-06-17 MED ORDER — HEPARIN SODIUM (PORCINE) 5000 UNIT/ML IJ SOLN
5000.0000 [IU] | Freq: Three times a day (TID) | INTRAMUSCULAR | Status: DC
  Administered 2021-06-17 – 2021-06-19 (×7): 5000 [IU] via SUBCUTANEOUS
  Filled 2021-06-17 (×7): qty 1

## 2021-06-17 MED ORDER — K PHOS MONO-SOD PHOS DI & MONO 155-852-130 MG PO TABS
500.0000 mg | ORAL_TABLET | Freq: Once | ORAL | Status: AC
  Administered 2021-06-17: 16:00:00 500 mg via ORAL
  Filled 2021-06-17 (×2): qty 2

## 2021-06-17 NOTE — Progress Notes (Signed)
Pt transported to [facility]. Updated wife on transfer. Confirmed only personal belongings with patient are orange pumpkin blanket, glasses, and a small personal pillow, with wife.

## 2021-06-17 NOTE — Progress Notes (Addendum)
STROKE TEAM PROGRESS NOTE   INTERVAL HISTORY CIR coordinator at bedside talking to patient regarding CIR placement.  Patient sitting in chair, AAOx3, still have right facial droop and right hemiparesis.  Off Cleviprex, on p.o. BP meds.  Passed swallow, on diet and will DC IV fluid  Vitals:   06/17/21 1200 06/17/21 1215 06/17/21 1600 06/17/21 1824  BP: (!) 148/98 (!) 148/102 (!) 151/104 (!) 154/90  Pulse: 78 75 89 90  Resp: 13 17 16 16   Temp: 98.8 F (37.1 C)  98.4 F (36.9 C) 98.7 F (37.1 C)  TempSrc: Oral  Oral Oral  SpO2: 96% 96% 98% 98%  Weight:      Height:       CBC:  Recent Labs  Lab 06/15/21 1350 06/17/21 0416  WBC 9.7 13.1*  NEUTROABS 7.7  --   HGB 15.2 15.2  HCT 45.0 45.3  MCV 87.9 87.8  PLT 160 168   Basic Metabolic Panel:  Recent Labs  Lab 06/15/21 1350 06/15/21 1420 06/15/21 1420 06/15/21 1602 06/15/21 1815 06/16/21 0717 06/17/21 0416  NA 139  --   --   --    < > 144 143  K 3.5  --   --   --   --   --  3.5  CL 107  --   --   --   --   --  109  CO2 22  --   --   --   --   --  22  GLUCOSE 123*  --   --   --   --   --  105*  BUN 14  --   --   --   --   --  8  CREATININE 1.14  --   --   --   --   --  1.01  CALCIUM 8.2*  --   --   --   --   --  8.4*  MG  --  2.0  --  2.0  --   --   --   PHOS  --  1.5*   < > 1.4*  --   --  2.6   < > = values in this interval not displayed.   Lipid Panel:  Recent Labs  Lab 06/15/21 1420  CHOL 173  TRIG 170*  HDL 36*  CHOLHDL 4.8  VLDL 34  LDLCALC 06/17/21*   HgbA1c:  Recent Labs  Lab 06/15/21 1413  HGBA1C 5.7*   Urine Drug Screen:  Recent Labs  Lab 06/15/21 1357  LABOPIA NONE DETECTED  COCAINSCRNUR NONE DETECTED  LABBENZ NONE DETECTED  AMPHETMU NONE DETECTED  THCU NONE DETECTED  LABBARB NONE DETECTED    Alcohol Level  Recent Labs  Lab 06/15/21 1357  ETH <10    IMAGING past 24 hours DG Swallowing Func-Speech Pathology  Result Date: 06/17/2021 Table formatting from the original result was not  included. Objective Swallowing Evaluation: Type of Study: MBS-Modified Barium Swallow Study  Patient Details Name: Drayson Dorko MRN: Tyson Dense Date of Birth: 04/21/1966 Today's Date: 06/17/2021 Time: SLP Start Time (ACUTE ONLY): 0830 -SLP Stop Time (ACUTE ONLY): 0843 SLP Time Calculation (min) (ACUTE ONLY): 13 min Past Medical History: Past Medical History: Diagnosis Date  Hypertension  Past Surgical History: Past Surgical History: Procedure Laterality Date  TYMPANOSTOMY TUBE PLACEMENT   HPI: Aking Klabunde is a 55 y.o. male PMHx HTN (untreated) with acute left basal ganglia intraparenchymal hemorrhage.  No data recorded  Recommendations for follow up  therapy are one component of a multi-disciplinary discharge planning process, led by the attending physician.  Recommendations may be updated based on patient status, additional functional criteria and insurance authorization. Assessment / Plan / Recommendation Clinical Impressions 06/17/2021 Clinical Impression Pt demosntrates mild sensory impairment leading to delayed swallow initaition with all trials, despite this, even with mixed consistency and pressure to drink consecutively, at worst, pt only had trace flash penetration with thin liquids. No residuals. Pt able to attempt regualr solids and thin liquids. May need reminders for lingual sweep/oral care after meals given sensory changes on the right. Suspect pt may have intermittent cough with intake if he is drinking very quickly.  Will f/u for tolerance. SLP Visit Diagnosis Dysphagia, oropharyngeal phase (R13.12) Attention and concentration deficit following -- Frontal lobe and executive function deficit following -- Impact on safety and function Mild aspiration risk   Treatment Recommendations 06/17/2021 Treatment Recommendations Therapy as outlined in treatment plan below   Prognosis 06/17/2021 Prognosis for Safe Diet Advancement Good Barriers to Reach Goals Language deficits;Cognitive deficits Barriers/Prognosis  Comment -- Diet Recommendations 06/17/2021 SLP Diet Recommendations Regular solids;Thin liquid Liquid Administration via Cup;Straw Medication Administration Whole meds with liquid Compensations Slow rate;Small sips/bites Postural Changes Seated upright at 90 degrees   Other Recommendations 06/17/2021 Recommended Consults -- Oral Care Recommendations Oral care before and after PO Other Recommendations Have oral suction available Follow Up Recommendations Acute inpatient rehab (3hours/day) Assistance recommended at discharge Frequent or constant Supervision/Assistance Functional Status Assessment Patient has had a recent decline in their functional status and demonstrates the ability to make significant improvements in function in a reasonable and predictable amount of time. Frequency and Duration  06/17/2021 Speech Therapy Frequency (ACUTE ONLY) min 2x/week Treatment Duration 2 weeks   Oral Phase 06/17/2021 Oral Phase Impaired Oral - Pudding Teaspoon -- Oral - Pudding Cup -- Oral - Honey Teaspoon -- Oral - Honey Cup -- Oral - Nectar Teaspoon WFL Oral - Nectar Cup Right anterior bolus loss Oral - Nectar Straw Right anterior bolus loss Oral - Thin Teaspoon -- Oral - Thin Cup Right anterior bolus loss Oral - Thin Straw Right anterior bolus loss Oral - Puree WFL Oral - Mech Soft -- Oral - Regular WFL Oral - Multi-Consistency -- Oral - Pill -- Oral Phase - Comment --  Pharyngeal Phase 06/17/2021 Pharyngeal Phase Impaired Pharyngeal- Pudding Teaspoon -- Pharyngeal -- Pharyngeal- Pudding Cup -- Pharyngeal -- Pharyngeal- Honey Teaspoon -- Pharyngeal -- Pharyngeal- Honey Cup -- Pharyngeal -- Pharyngeal- Nectar Teaspoon Delayed swallow initiation-vallecula Pharyngeal -- Pharyngeal- Nectar Cup Delayed swallow initiation-pyriform sinuses Pharyngeal -- Pharyngeal- Nectar Straw Delayed swallow initiation-pyriform sinuses Pharyngeal -- Pharyngeal- Thin Teaspoon -- Pharyngeal -- Pharyngeal- Thin Cup Delayed swallow  initiation-pyriform sinuses;Penetration/Aspiration before swallow Pharyngeal Material enters airway, remains ABOVE vocal cords then ejected out;Material does not enter airway Pharyngeal- Thin Straw Delayed swallow initiation-pyriform sinuses;Penetration/Aspiration before swallow Pharyngeal Material enters airway, remains ABOVE vocal cords then ejected out;Material does not enter airway Pharyngeal- Puree Delayed swallow initiation-vallecula Pharyngeal -- Pharyngeal- Mechanical Soft -- Pharyngeal -- Pharyngeal- Regular Delayed swallow initiation-vallecula Pharyngeal -- Pharyngeal- Multi-consistency -- Pharyngeal -- Pharyngeal- Pill -- Pharyngeal -- Pharyngeal Comment --  No flowsheet data found. DeBlois, [name] 06/17/2021, 10:38 AM                      PHYSICAL EXAM  Temp:  [98.4 F (36.9 C)-98.9 F (37.2 C)] 98.7 F (37.1 C) (12/28 1824) Pulse Rate:  [70-112] 90 (12/28 1824) Resp:  [13-28]  16 (12/28 1824) BP: (116-176)/(80-126) 154/90 (12/28 1824) SpO2:  [89 %-99 %] 98 % (12/28 1824)  General - Well nourished, well developed, in no apparent distress. Ophthalmologic - fundi not visualized due to noncooperation. Cardiovascular - Tachycardic, hypertensive  Mental Status -  Patient is alert and oriented to self and time. He is able to answer other orientation questions with verbal cues and prompting.  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 He is able to repeat words and phrases, but he makes errors with repetition and answering questions.  When asked his son's name he states his name, but is able to correct himself with prompting. Names his wife correctly.   Cranial Nerves II - XII - II - Visual field intact OU.  III, IV, VI - Extraocular movements intact. V - Facial sensation intact bilaterally. VII - Right facial droop VIII - Hearing & vestibular intact bilaterally. X - Palate elevates symmetrically. XI - Head is midline XII - Tongue protrusion  intact.  Motor Strength - Weakness in right extremities, RUA 1/5, RLE 1/5- slight lateral movement  Full strength in left upper and lower extremity  Motor Tone - Muscle tone was assessed at the neck and appendages and was normal. Reflexes - The patients reflexes were symmetrical in all extremities and he had no pathological reflexes. Sensory - No sensation in right arm or right leg Coordination - The patient had normal movements in the left hand with no ataxia or dysmetria.  Tremor was absent. Deferred in Rt hand due to weakness Gait and Station - deferred.   ASSESSMENT/PLAN Mr. Finnbar Cedillos is a 55 y.o. male with history of HTN, non compliant with medications, presenting with right sided weakness and slurred speech. He was walking into the gym when this occurred. EMS was called and his BP on the scene was 240/110. Initial NIH 11. Initial CT scan shows IPH in left basal ganglia and external capsule region. Repeat CT stable and CTA showed no LVO, aneurysm, or vascular malformation. MRI pending. SLP to do swallow and cognitive evaluation today. Sart PO blood pressure medications to wean off cleviprex infusion. Plan to titrate off 3% today and then start NS until adequate PO intake.   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 Focal hematoma with estimated volume of 23 cc. Mild surrounding edema and mass effect.  CTA head & neck -No intracranial large vessel occlusion or stenosis. no aneurysm or vascular malformation. MRI left BG ICH, stable, no mass lesion or enhancement 2D Echo EF 65-70% LDL 103 HgbA1c 5.7 VTE prophylaxis -Heparin subcu No antithrombotic prior to admission, now on No antithrombotic due to IPH Localized cerebral Edema, was on 3% saline - titrate down to discontinue Therapy recommendations: CIR Disposition:  Pending  Hypertensive emergency Home meds:  none, noncompliant Stable now off  cleviprex   BP goal < 160 On llisinopril 20 bid and norvasc 10 on metoprolol 50 bid ->75 bid  Hyperlipidemia Home meds:  None LDL 103, goal < 70 Hold due to IPH Consider statin at discharge  Other Stroke Risk Factors ETOH use, alcohol level <10, advised to drink no more than 2 drink(s) a day Tachycardia resolved after metoprolol addition  Other Active Problems   Hospital day # 2  This patient is critically ill due to ICH, local cerebral edema, hypertensive emergency and at significant risk of neurological worsening, death form brain herniation, hypertensive encephalopathy, seizure, hematoma  expansion. This patient's care requires constant monitoring of vital signs, hemodynamics, respiratory and cardiac monitoring, review of multiple databases, neurological assessment, discussion with family, other specialists and medical decision making of high complexity. I spent 35 minutes of neurocritical care time in the care of this patient.   Marvel Plan, MD PhD Stroke Neurology 06/17/2021 7:45 PM   To contact Stroke Continuity provider, please refer to [email or web address]. After hours, contact General Neurology

## 2021-06-17 NOTE — Progress Notes (Signed)
Inpatient Rehab Admissions Coordinator:   I spoke with Pt. Regarding potential CIR admit. He stated interest and that wife can provide 24/7 support at discharge. I called and left VM with wife to confirm that. I will open a case with his insurance and pursue for potential admission.   Megan Salon, MS, CCC-SLP Rehab Admissions Coordinator  [phone] (celll) [phone] (office)

## 2021-06-17 NOTE — Progress Notes (Signed)
Occupational Therapy Treatment Patient Details Name: Wayne Mendez MRN: [id number] DOB: 05-20-1966 Today's Date: 06/17/2021   History of present illness Pt is 55 yo male who developed sudden R sided weakness and slurred speech walking into the gym on 06/15/21. 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  PMH: HTN.   OT comments  Pt progressed from bed to chair this session with stedy. Pt seems more positive after session and thanking therapist. Pt answering yes no questions 5 out 5 correctly however does benefit from minimal word cueing to process the request. Pt states "pretty cool" after being allowed time at the doorway in the stedy to visualize outside his room into the ICU space. Recommendation for CIR at this time.    Recommendations for follow up therapy are one component of a multi-disciplinary discharge planning process, led by the attending physician.  Recommendations may be updated based on patient status, additional functional criteria and insurance authorization.    Follow Up Recommendations  Acute inpatient rehab (3hours/day)    Assistance Recommended at Discharge Frequent or constant Supervision/Assistance  Equipment Recommendations  BSC/3in1    Recommendations for Other Services Rehab consult    Precautions / Restrictions Precautions Precautions: Fall Precaution Comments: SBP <160 Restrictions Weight Bearing Restrictions: No       Mobility Bed Mobility Overal bed mobility: Needs Assistance Bed Mobility: Supine to Sit     Supine to sit: Min assist;+2 for safety/equipment     General bed mobility comments: Able to use L UE to assist with R LE. Instructed patient on using L LE to hook R LE to assist off bed. MinA to bring LE towards EOB. Patient reaching for foot board to assist with trunk elevation    Transfers Overall transfer level: Needs assistance Equipment used: Ambulation equipment used Transfers: Sit to/from Stand;Bed to  chair/wheelchair/BSC Sit to Stand: Min assist;+2 physical assistance           General transfer comment: sit to stand from bed with minA with use of Stedy. patient with flicker of quad activation in standing. Benefits from simple 1 step commands. Able to perform mini squats x 10 in [place] Transfer via Lift Equipment: Stedy   Balance Overall balance assessment: Needs assistance Sitting-balance support: Feet supported;Single extremity supported Sitting balance-Leahy Scale: Poor Sitting balance - Comments: R lateral in standing. IMproved with use of bar on Stedy   Standing balance support: Bilateral upper extremity supported;During functional activity Standing balance-Leahy Scale: Poor Standing balance comment: reliant on UE support and external assist                           ADL either performed or assessed with clinical judgement   ADL Overall ADL's : Needs assistance/impaired Eating/Feeding: Minimal assistance;Sitting                       Toilet Transfer: +2 for physical assistance;Minimal assistance Toilet Transfer Details (indicate cue type and reason): attempting with stedy simulated                Extremity/Trunk Assessment Upper Extremity Assessment Upper Extremity Assessment: RUE deficits/detail RUE Deficits / Details: flaccid no movement   Lower Extremity Assessment Lower Extremity Assessment: Defer to PT evaluation        Vision       Perception     Praxis      Cognition Arousal/Alertness: Awake/alert Behavior During Therapy: Impulsive Overall Cognitive Status: Impaired/Different from baseline  Area of Impairment: Attention;Safety/judgement;Awareness                   Current Attention Level: Selective     Safety/Judgement: Decreased awareness of safety;Decreased awareness of deficits Awareness: Emergent   General Comments: impulsive requiring cues for waiting for therapist and slowing down. Answering questions  quickly with "yes" and "no". Speech unintelligible at times. A&Ox3 with various yes/no questions. Unable to determine if aware of situation Functional Status Assessment: Patient has had a recent decline in their functional status and demonstrates the ability to make significant improvements in function in a reasonable and predictable amount of time.        Exercises Exercises: General Lower Extremity General Exercises - Lower Extremity Mini-Sqauts: AROM;10 reps   Shoulder Instructions       General Comments BP monitored and noted to be orthostatic    Pertinent Vitals/ Pain       Pain Assessment: No/denies pain  Home Living                                          Prior Functioning/Environment              Frequency  Min 2X/week        Progress Toward Goals  OT Goals(current goals can now be found in the care plan section)  Progress towards OT goals: Progressing toward goals  Acute Rehab OT Goals Patient Stated Goal: "im okay" "its good" pt appreciates being oob with stedy OT Goal Formulation: Patient unable to participate in goal setting Time For Goal Achievement: 06/30/21 Potential to Achieve Goals: Good ADL Goals Pt Will Perform Eating: with supervision;with set-up;sitting Pt Will Perform Grooming: with set-up;with supervision;sitting Pt Will Perform Upper Body Bathing: with set-up;with supervision;sitting Pt Will Perform Lower Body Bathing: with min assist;sitting/lateral leans;sit to/from stand Pt Will Transfer to Toilet: with min assist;bedside commode;squat pivot transfer Additional ADL Goal #1: Maintain midline postural control EOB with S during dynamic ADL tasks  Plan Discharge plan remains appropriate    Co-evaluation    PT/OT/SLP Co-Evaluation/Treatment: Yes Reason for Co-Treatment: Complexity of the patient's impairments (multi-system involvement);Necessary to address cognition/behavior during functional activity;To address  functional/ADL transfers;For patient/therapist safety PT goals addressed during session: Mobility/safety with mobility;Balance;Strengthening/ROM OT goals addressed during session: ADL's and self-care;Proper use of Adaptive equipment and DME;Strengthening/ROM      AM-PAC OT "6 Clicks" Daily Activity     Outcome Measure   Help from another person eating meals?: A Little Help from another person taking care of personal grooming?: A Lot Help from another person toileting, which includes using toliet, bedpan, or urinal?: A Lot Help from another person bathing (including washing, rinsing, drying)?: A Lot Help from another person to put on and taking off regular upper body clothing?: A Lot Help from another person to put on and taking off regular lower body clothing?: A Lot 6 Click Score: 13    End of Session Equipment Utilized During Treatment: Gait belt  OT Visit Diagnosis: Unsteadiness on feet (R26.81);Other abnormalities of gait and mobility (R26.89);Muscle weakness (generalized) (M62.81);Apraxia (R48.2);Other symptoms and signs involving cognitive function;Cognitive communication deficit (R41.841);Hemiplegia and hemiparesis Symptoms and signs involving cognitive functions: Nontraumatic intracerebral hemorrhage Hemiplegia - Right/Left: Right Hemiplegia - dominant/non-dominant: Non-Dominant Hemiplegia - caused by: Nontraumatic intracerebral hemorrhage   Activity Tolerance Patient tolerated treatment well   Patient Left in chair;with call bell/phone within  reach;with chair alarm set;with family/visitor present (friend Investment banker, corporate)   Nurse Communication Mobility status;Precautions        Time: [phone] OT Time Calculation (min): 29 min  Charges: OT General Charges $OT Visit: 1 Visit OT Evaluation $OT Eval Moderate Complexity: 1 Mod   Brynn, OTR/L  Acute Rehabilitation Services Pager: [phone] Office: [phone] .   Mateo Flow 06/17/2021, 12:23 PM

## 2021-06-17 NOTE — Progress Notes (Signed)
Modified Barium Swallow Progress Note  Patient Details  Name: Wayne Mendez MRN: [id number] Date of Birth: [birth year]  Today's Date: 06/17/2021  Modified Barium Swallow completed.  Full report located under Chart Review in the Imaging Section.  Brief recommendations include the following:  Clinical Impression  Pt demosntrates mild sensory impairment leading to delayed swallow initaition with all trials, despite this, even with mixed consistency and pressure to drink consecutively, at worst, pt only had trace flash penetration with thin liquids. No residuals. Pt able to attempt regular solids and thin liquids. May need reminders for lingual sweep/oral care after meals given sensory changes on the right. Suspect pt may have intermittent cough with intake if he is drinking very quickly.  Will f/u for tolerance.   Swallow Evaluation Recommendations       SLP Diet Recommendations: Regular solids;Thin liquid   Liquid Administration via: Cup;Straw   Medication Administration: Whole meds with liquid       Compensations: Slow rate;Small sips/bites   Postural Changes: Seated upright at 90 degrees   Oral Care Recommendations: Oral care before and after PO   Other Recommendations: Have oral suction available    Advit Trethewey, [name] 06/17/2021,10:37 AM

## 2021-06-17 NOTE — PMR Pre-admission (Signed)
PMR Admission Coordinator Pre-Admission Assessment  Patient: Wayne Mendez is an 55 y.o., male MRN: [id number] DOB: 03-07-1966 Height: 6' (182.9 cm) Weight: 96.8 kg  Insurance Information HMO:     PPO:      PCP:      IPA:      80/20: yes     OTHER:  PRIMARY: [employer] Commercial      Policy#: [id number]      Subscriber: Pt  CM Name: Betti Cruz       Phone#: [phone]  Fax#: [phone]  I received a message from Leda Gauze at [facility] on 06/18/21 stating Pt. Is approved for CIR. Aprroval dates begin on the day of admission and continue for 5 days, with updates due on the 5th day following admission. (Should be 0/0/17)  Pre-Cert#: [id number]      Employer:  Benefits:  Phone #:      Name:  Irene Shipper Date: 11/19/2020- still active Deductible: $5,000 ($2,398.95 met) OOP Max: $8,150 ($6,384.66 met) CIR: 50% coverage, 50% co-insurance SNF: 50% coverage, 50% co-insurance, Limited to 60 Days Per Calendar Year combined with [place]. Outpatient:  $30 co-pay, 23 pt/ot visits/cal yr, 30 slp visits/cal yr Home Health: 50% coverage, 50% co-insurance; limited to 60 visits/cal yr Providers: in network  SECONDARY: none       Policy#:      Phone#:    Development worker, community:       Phone#:   The Actuary for patients in Inpatient Rehabilitation Facilities with attached Privacy Act [place] Records was provided and verbally reviewed with: Patient  Emergency Contact Information Contact Information     Name Relation Home Work [place]   [phone]       Current Medical History  Patient Admitting Diagnosis: [place] History of Present Illness: [name] is a 55 y.o. male with history of HTN, non compliant with medications, presenting with right sided weakness and slurred speech. He was walking into the gym when this occurred. EMS was called and his BP on the scene was 240/110. Initial NIH 11. 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 Repeat CT stable and CTA showed no LVO, aneurysm, or vascular malformation. MRI revealing for intraparenchymal hemorrhage centered in the left basal ganglia, extending into the left frontal and temporal lobes, with mild surrounding edema and local mass effect, causing approximately 3 mm of left-to-right midline shift. CIR consulted to assist return to PLOF.  Complete NIHSS TOTAL: 19  Patient's medical record from [facility]  has been reviewed by the rehabilitation admission coordinator and physician.  Past Medical History  Past Medical History:  Diagnosis Date   Hypertension     Has the patient had major surgery during 100 days prior to admission? No  Family History   family history is not on file.  Current Medications  Current Facility-Administered Medications:    acetaminophen (TYLENOL) tablet 650 mg, 650 mg, Oral, Q4H PRN, 650 mg at 06/17/21 0927 **OR** acetaminophen (TYLENOL) 160 MG/5ML solution 650 mg, 650 mg, Per Tube, Q4H PRN **OR** acetaminophen (TYLENOL) suppository 650 mg, 650 mg, Rectal, Q4H PRN, Theda Sers, Unk Pinto, MD   amLODipine (NORVASC) tablet 10 mg, 10 mg, Oral, Daily, Rosalin Hawking, MD, 10 mg at 06/17/21 9390   Chlorhexidine Gluconate Cloth 2 % PADS 6 each, 6 each, Topical, Daily, Gwinda Maine, MD, 6 each at 06/16/21 1114   heparin injection 5,000 Units, 5,000 Units, Subcutaneous, Q8H,  Rosalin Hawking, MD   labetalol (NORMODYNE) injection 10-20 mg, 10-20 mg, Intravenous, Q2H PRN, Rosalin Hawking, MD   lisinopril (ZESTRIL) tablet 20 mg, 20 mg, Oral, BID, Rosalin Hawking, MD, 20 mg at 06/17/21 0347   metoprolol tartrate (LOPRESSOR) tablet 50 mg, 50 mg, Oral, BID, Rosalin Hawking, MD, 50 mg at 06/17/21 4259   pantoprazole (PROTONIX) EC tablet 40 mg, 40 mg, Oral, Daily, Rosalin Hawking, MD, 40 mg at 06/17/21 5638   phosphorus (K PHOS NEUTRAL) tablet 500 mg, 500 mg, Oral, Once, Rosalin Hawking, MD   senna-docusate (Senokot-S)  tablet 1 tablet, 1 tablet, Oral, BID, Gwinda Maine, MD, 1 tablet at 06/17/21 7564  Patients Current Diet:  Diet Order             Diet regular Room service appropriate? Yes; Fluid consistency: Thin  Diet effective now                   Precautions / Restrictions Precautions Precautions: Fall Precaution Comments: SBP <160 Restrictions Weight Bearing Restrictions: No   Has the patient had 2 or more falls or a fall with injury in the past year? No  Prior Activity Level Community (5-7x/wk): Pt. working, active in the community PTA  Prior Functional Level Self Care: Did the patient need help bathing, dressing, using the toilet or eating? Independent  Indoor Mobility: Did the patient need assistance with walking from room to room (with or without device)? Independent  Stairs: Did the patient need assistance with internal or external stairs (with or without device)? Independent  Functional Cognition: Did the patient need help planning regular tasks such as shopping or remembering to take medications? Independent  Patient Information Are you of Hispanic, Latino/a,or Spanish origin?: A. No, not of Hispanic, Latino/a, or Spanish origin What is your race?: A. White Do you need or want an interpreter to communicate with a doctor or health care staff?: 0. No  Patient's Response To:  Health Literacy and Transportation Is the patient able to respond to health literacy and transportation needs?: Yes Health Literacy - How often do you need to have someone help you when you read instructions, pamphlets, or other written material from your doctor or pharmacy?: Never In the past 12 months, has lack of transportation kept you from medical appointments or from getting medications?: Yes In the past 12 months, has lack of transportation kept you from meetings, work, or from getting things needed for daily living?: Yes  Home Assistive Devices / [place] Devices/Equipment:  None Home Equipment: None  Prior Device Use: Indicate devices/aids used by the patient prior to current illness, exacerbation or injury? None of the above  Current Functional Level Cognition  Arousal/Alertness: Awake/alert Overall Cognitive Status: Impaired/Different from baseline Difficult to assess due to: Impaired communication Current Attention Level: Selective Orientation Level: Oriented X4 Safety/Judgement: Decreased awareness of safety, Decreased awareness of deficits General Comments: impulsive requiring cues for waiting for therapist and slowing down. Answering questions quickly with "yes" and "no". Speech unintelligible at times. A&Ox3 with various yes/no questions. Unable to determine if aware of situation Attention: Focused, Sustained Focused Attention: Appears intact Sustained Attention: Appears intact Awareness: Impaired Awareness Impairment: Intellectual impairment, Emergent impairment Problem Solving: Impaired Problem Solving Impairment: Functional basic, Verbal basic Safety/Judgment: Impaired    Extremity Assessment (includes Sensation/Coordination)  Upper Extremity Assessment: RUE deficits/detail RUE Deficits / Details: flaccid no movement RUE Sensation: decreased light touch, decreased proprioception RUE Coordination: decreased fine motor, decreased gross motor  Lower Extremity Assessment:  Defer to PT evaluation RLE Deficits / Details: no noted active mvmt of RLE, R knee buckling in standing, decreased sensation and joint sense noted RLE Sensation: decreased light touch, decreased proprioception RLE Coordination: decreased fine motor, decreased gross motor    ADLs  Overall ADL's : Needs assistance/impaired Eating/Feeding: Minimal assistance, Sitting Eating/Feeding Details (indicate cue type and reason): dys 2; honey thick liquids Grooming: Moderate assistance Upper Body Bathing: Moderate assistance Lower Body Bathing: Moderate assistance, Bed level,  Sitting/lateral leans Upper Body Dressing : Moderate assistance Lower Body Dressing: Maximal assistance Toilet Transfer: +2 for physical assistance, Minimal assistance Toilet Transfer Details (indicate cue type and reason): attempting with stedy simulated Toileting - Clothing Manipulation Details (indicate cue type and reason): using urinal; max A Functional mobility during ADLs: Maximal assistance, +2 for physical assistance    Mobility  Overal bed mobility: Needs Assistance Bed Mobility: Supine to Sit Supine to sit: Min assist, +2 for safety/equipment General bed mobility comments: Able to use L UE to assist with R LE. Instructed patient on using L LE to hook R LE to assist off bed. MinA to bring LE towards EOB. Patient reaching for foot board to assist with trunk elevation    Transfers  Overall transfer level: Needs assistance Equipment used: Ambulation equipment used Transfers: Sit to/from Stand, Bed to chair/wheelchair/BSC Sit to Stand: Min assist, +2 physical assistance Bed to/from chair/wheelchair/BSC transfer type:: Via Lift equipment Step pivot transfers: Max assist, +2 physical assistance (no initiation of moving R leg) Transfer via Lift Equipment: Stedy General transfer comment: sit to stand from bed with minA with use of Stedy. patient with flicker of quad activation in standing. Benefits from simple 1 step commands. Able to perform mini squats x 10 in [place]    Ambulation / Gait / Stairs / Wheelchair Mobility  Ambulation/Gait General Gait Details: unable to perform safely at this time    Posture / Balance Dynamic Sitting Balance Sitting balance - Comments: R lateral in standing. IMproved with use of bar on Stedy Balance Overall balance assessment: Needs assistance Sitting-balance support: Feet supported, Single extremity supported Sitting balance-Leahy Scale: Poor Sitting balance - Comments: R lateral in standing. IMproved with use of bar on Stedy Standing balance  support: Bilateral upper extremity supported, During functional activity Standing balance-Leahy Scale: Poor Standing balance comment: reliant on UE support and external assist    Special needs/care consideration Special service needs none    Previous Home Environment (from acute therapy documentation) Living Arrangements: Spouse/significant other, Children  Lives With: Spouse, Son Available Help at Discharge: Available PRN/intermittently, Family Type of Home: House Home Layout: One level Home Access: Stairs to enter [employer] Shower/Tub: Holiday representative Accessibility: Yes Home Care Services: No Additional Comments: need to confirm info due to pt's cognition and aphasia  Discharge Living Setting Plans for Discharge Living Setting: Patient's home Type of Home at Discharge: House Discharge Home Layout: One level Discharge Home Access: Stairs to enter Entrance Stairs-Rails: None Entrance Stairs-Number of Steps: 2 Discharge Bathroom Toilet: Standard Discharge Bathroom Accessibility: No Does the patient have any problems obtaining your medications?: No  Social/Family/Support Systems Patient Roles: Spouse Contact Information: [phone] Anticipated Caregiver: Wannetta Sender (wife) Anticipated Caregiver's Contact Information: 24/7 Ability/Limitations of Caregiver: Min A Caregiver Availability: 24/7 Discharge Plan Discussed with Primary Caregiver: Yes Is Caregiver In Agreement with Plan?: Yes Does Caregiver/Family have Issues with Lodging/Transportation while Pt is in Rehab?: No  Goals Patient/Family Goal for Rehab: PT/OT/SLP Mod I Expected length of stay: 7-10 days Pt/Family Agrees  to Admission and willing to participate: Yes Program Orientation Provided & Reviewed with Pt/Caregiver Including Roles  & Responsibilities: Yes  Decrease burden of Care through IP rehab admission: Specialzed equipment needs, Decrease number of caregivers, Bowel and bladder program, and Patient/family  education  Possible need for SNF placement upon discharge: not anticipated   Patient Condition: I have reviewed medical records from [facility], spoken with CM, and patient and spouse. I met with patient at the bedside for inpatient rehabilitation assessment.  Patient will benefit from ongoing PT, OT, and SLP, can actively participate in 3 hours of therapy a day 5 days of the week, and can make measurable gains during the admission.  Patient will also benefit from the coordinated team approach during an Inpatient Acute Rehabilitation admission.  The patient will receive intensive therapy as well as Rehabilitation physician, nursing, social worker, and care management interventions.  Due to safety, skin/wound care, disease management, medication administration, pain management, and patient education the patient requires 24 hour a day rehabilitation nursing.  The patient is currently min A-min G with mobility and basic ADLs.  Discharge setting and therapy post discharge at home with home health is anticipated.  Patient has agreed to participate in the Acute Inpatient Rehabilitation Program and will admit today.  Preadmission Screen Completed By:  Genella Mech, 06/17/2021 3:34 PM ______________________________________________________________________   Discussed status with Dr. Naaman Plummer on 06/19/21 at 66 and received approval for admission today.  Admission Coordinator:  Genella Mech, CCC-SLP, time 1000/Date 06/19/21   Assessment/Plan: Diagnosis: ICH Does the need for close, 24 hr/day Medical supervision in concert with the patient's rehab needs make it unreasonable for this patient to be served in a less intensive setting? Yes Co-Morbidities requiring supervision/potential complications: HTN, Due to bladder management, bowel management, safety, skin/wound care, disease management, medication administration, pain management, and patient education, does the patient require 24 hr/day  rehab nursing? Yes Does the patient require coordinated care of a physician, rehab nurse, PT, OT, and SLP to address physical and functional deficits in the context of the above medical diagnosis(es)? Yes Addressing deficits in the following areas: balance, endurance, locomotion, strength, transferring, bowel/bladder control, bathing, dressing, feeding, grooming, toileting, cognition, speech, swallowing, and psychosocial support Can the patient actively participate in an intensive therapy program of at least 3 hrs of therapy 5 days a week? Yes The potential for patient to make measurable gains while on inpatient rehab is excellent Anticipated functional outcomes upon discharge from inpatient rehab: modified independent PT, modified independent OT, modified independent SLP Estimated rehab length of stay to reach the above functional goals is: 7-10 days Anticipated discharge destination: Home 10. Overall Rehab/Functional Prognosis: excellent   MD Signature: Meredith Staggers, MD, [place] Director Rehabilitation Services 06/19/2021

## 2021-06-17 NOTE — Progress Notes (Signed)
Physical Therapy Treatment Patient Details Name: Wayne Mendez MRN: [id number] DOB: [birth year] Today's Date: 06/17/2021   History of Present Illness Pt is 55 yo male who developed sudden R sided weakness and slurred speech walking into the gym on 06/15/21. 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  PMH: HTN.    PT Comments    Patient progressing towards physical therapy goals. Patient continues to be impulsive and benefits from direct 1 step commands. Patient able to perform sit to stand with Stedy and minA. Performed mini squats x 10 with L UE support and minA. Patient orthostatic with mobility this date, see below. Continue to recommend CIR level therapies to assist with maximizing functional mobility and safety.   Orthostatic BPs  Sitting prior to mobility 151/114  Sitting after mobility  112/75  End of session in chair  128/87      Recommendations for follow up therapy are one component of a multi-disciplinary discharge planning process, led by the attending physician.  Recommendations may be updated based on patient status, additional functional criteria and insurance authorization.  Follow Up Recommendations  Acute inpatient rehab (3hours/day)     Assistance Recommended at Discharge Frequent or constant Supervision/Assistance  Equipment Recommendations  Other (comment) (TBD)    Recommendations for Other Services       Precautions / Restrictions Precautions Precautions: Fall Precaution Comments: SBP <160 Restrictions Weight Bearing Restrictions: No     Mobility  Bed Mobility Overal bed mobility: Needs Assistance Bed Mobility: Supine to Sit     Supine to sit: Min assist;+2 for safety/equipment     General bed mobility comments: Able to use L UE to assist with R LE. Instructed patient on using L LE to hook R LE to assist off bed. MinA to bring LE towards EOB. Patient reaching for foot board to assist with trunk elevation    Transfers Overall  transfer level: Needs assistance Equipment used: Ambulation equipment used Transfers: Sit to/from Stand;Bed to chair/wheelchair/BSC Sit to Stand: Min assist;+2 physical assistance           General transfer comment: sit to stand from bed with minA with use of Stedy. patient with flicker of quad activation in standing. Benefits from simple 1 step commands. Able to perform mini squats x 10 in [place] Transfer via Lift Equipment: Stedy  Ambulation/Gait                   Stairs             Wheelchair Mobility    Modified Rankin (Stroke Patients Only) Modified Rankin (Stroke Patients Only) Pre-Morbid Rankin Score: No symptoms Modified Rankin: Severe disability     Balance Overall balance assessment: Needs assistance Sitting-balance support: Feet supported;Single extremity supported Sitting balance-Leahy Scale: Poor Sitting balance - Comments: R lateral in standing. IMproved with use of bar on Stedy   Standing balance support: Bilateral upper extremity supported;During functional activity Standing balance-Leahy Scale: Poor Standing balance comment: reliant on UE support and external assist                            Cognition Arousal/Alertness: Awake/alert Behavior During Therapy: Impulsive Overall Cognitive Status: Impaired/Different from baseline Area of Impairment: Attention;Safety/judgement;Awareness                   Current Attention Level: Selective     Safety/Judgement: Decreased awareness of safety;Decreased awareness of deficits Awareness: Emergent   General Comments: impulsive  requiring cues for waiting for therapist and slowing down. Answering questions quickly with "yes" and "no". Speech unintelligible at times. A&Ox3 with various yes/no questions. Unable to determine if aware of situation Functional Status Assessment: Patient has had a recent decline in their functional status and demonstrates the ability to make significant  improvements in function in a reasonable and predictable amount of time.      Exercises General Exercises - Lower Extremity Mini-Sqauts: AROM;10 reps    General Comments General comments (skin integrity, edema, etc.): BP within parameters of <160      Pertinent Vitals/Pain Pain Assessment: No/denies pain    Home Living                          Prior Function            PT Goals (current goals can now be found in the care plan section) Acute Rehab PT Goals Patient Stated Goal: return of independence PT Goal Formulation: With patient Time For Goal Achievement: 06/30/21 Potential to Achieve Goals: Good Progress towards PT goals: Progressing toward goals    Frequency    Min 4X/week      PT Plan Current plan remains appropriate    Co-evaluation PT/OT/SLP Co-Evaluation/Treatment: Yes Reason for Co-Treatment: Necessary to address cognition/behavior during functional activity;For patient/therapist safety;To address functional/ADL transfers PT goals addressed during session: Mobility/safety with mobility;Balance;Strengthening/ROM        AM-PAC PT "6 Clicks" Mobility   Outcome Measure  Help needed turning from your back to your side while in a flat bed without using bedrails?: A Little Help needed moving from lying on your back to sitting on the side of a flat bed without using bedrails?: A Little Help needed moving to and from a bed to a chair (including a wheelchair)?: Total Help needed standing up from a chair using your arms (e.g., wheelchair or bedside chair)?: Total Help needed to walk in hospital room?: Total Help needed climbing 3-5 steps with a railing? : Total 6 Click Score: 10    End of Session Equipment Utilized During Treatment: Gait belt Activity Tolerance: Patient tolerated treatment well Patient left: in chair;with call bell/phone within reach;with chair alarm set;with family/visitor present Nurse Communication: Mobility status PT Visit  Diagnosis: Unsteadiness on feet (R26.81);Hemiplegia and hemiparesis;Difficulty in walking, not elsewhere classified (R26.2) Hemiplegia - Right/Left: Right Hemiplegia - dominant/non-dominant: Non-dominant Hemiplegia - caused by: Nontraumatic intracerebral hemorrhage     Time: 0737-1062 PT Time Calculation (min) (ACUTE ONLY): 29 min  Charges:  $Therapeutic Exercise: 8-22 mins                     Mitch Arquette A. Dan Humphreys PT, DPT Acute Rehabilitation Services Pager [phone] Office [phone]    Viviann Spare 06/17/2021, 12:10 PM

## 2021-06-18 DIAGNOSIS — E7800 Pure hypercholesterolemia, unspecified: Secondary | ICD-10-CM

## 2021-06-18 LAB — BASIC METABOLIC PANEL
Anion gap: 10 (ref 5–15)
BUN: 15 mg/dL (ref 6–20)
CO2: 24 mmol/L (ref 22–32)
Calcium: 8.5 mg/dL — ABNORMAL LOW (ref 8.9–10.3)
Chloride: 106 mmol/L (ref 98–111)
Creatinine, Ser: 1.14 mg/dL (ref 0.61–1.24)
GFR, Estimated: >60 mL/min (ref >60)
Glucose, Bld: 100 mg/dL — ABNORMAL HIGH (ref 70–99)
Potassium: 3.2 mmol/L — ABNORMAL LOW (ref 3.5–5.1)
Sodium: 140 mmol/L (ref 135–145)

## 2021-06-18 LAB — CBC
HCT: 44.2 % (ref 39.0–52.0)
Hemoglobin: 14.6 g/dL (ref 13.0–17.0)
MCH: 29.1 pg (ref 26.0–34.0)
MCHC: 33 g/dL (ref 30.0–36.0)
MCV: 88.2 fL (ref 80.0–100.0)
Platelets: 160 10*3/uL (ref 150–400)
RBC: 5.01 MIL/uL (ref 4.22–5.81)
RDW: 13.6 % (ref 11.5–15.5)
WBC: 13 10*3/uL — ABNORMAL HIGH (ref 4.0–10.5)
nRBC: 0 % (ref 0.0–0.2)

## 2021-06-18 MED ORDER — POTASSIUM CHLORIDE CRYS ER 20 MEQ PO TBCR
40.0000 meq | EXTENDED_RELEASE_TABLET | Freq: Once | ORAL | Status: AC
  Administered 2021-06-18: 13:00:00 40 meq via ORAL
  Filled 2021-06-18: qty 2

## 2021-06-18 NOTE — ED Notes (Signed)
LWBS 

## 2021-06-18 NOTE — Progress Notes (Signed)
Physical Therapy Treatment Patient Details Name: Wayne Mendez MRN: [id number] DOB: [birth year] Today's Date: 06/18/2021   History of Present Illness Pt is 55 yo male who developed sudden R sided weakness and slurred speech walking into the gym on 06/15/21. 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  PMH: HTN.    PT Comments    Pt very motivated but impulsive at times , requiring frequent cues for safety, controlled motion, and focus on using R side.  Did not note any active contractions in R UE or LE , other than slight hip flexion.  Pt with excellent participation with standing pre-gait activities in STEDY.  Gave  HEP for AAROM exercises and PF stretch.    Recommendations for follow up therapy are one component of a multi-disciplinary discharge planning process, led by the attending physician.  Recommendations may be updated based on patient status, additional functional criteria and insurance authorization.  Follow Up Recommendations  Acute inpatient rehab (3hours/day)     Assistance Recommended at Discharge Frequent or constant Supervision/Assistance  Equipment Recommendations  Other (comment) (further assessment post acute)    Recommendations for Other Services Rehab consult     Precautions / Restrictions Precautions Precautions: Fall Precaution Comments: SBP <160 Restrictions Weight Bearing Restrictions: No     Mobility  Bed Mobility Overal bed mobility: Needs Assistance Bed Mobility: Supine to Sit     Supine to sit: Min assist     General bed mobility comments: Pt impulsive and moving quickly.  Kept trying to sit and move R LE with L UE but kept tipping over to R side.  REquired max multimodal cues and assist to stop and perform controlled movements including moving R LE over before trying to sit in order to maintain balance    Transfers Overall transfer level: Needs assistance Equipment used: Ambulation equipment used Transfers: Sit to/from  Stand Sit to Stand: Min guard           General transfer comment: Pt performed at least 6 sit to stands during session from bed or STEDY.  Only required min guard but heavy dependence on L leg and UE. Stedy for pivot. Transfer via Lift Equipment: Stedy  Ambulation/Gait             Pre-gait activities: Majority of session focused on pre -gait/standing activities in [place].  Performed 3x10 mini-squats and 3x10 weight shifting with frequent cues for controlled motion, therapist providing support at R knee with facilitation to activated quads/straighten knee, and heavy reliance on L side.  On last bout of weight shifting, improved control but still no noted active quad contraction.  1x10 lifting R heel with mod A - slight assist from hip flex General Gait Details: unable to perform safely at this time   Stairs             Wheelchair Mobility    Modified Rankin (Stroke Patients Only) Modified Rankin (Stroke Patients Only) Pre-Morbid Rankin Score: No symptoms Modified Rankin: Severe disability     Balance Overall balance assessment: Needs assistance Sitting-balance support: Single extremity supported;No upper extremity supported Sitting balance-Leahy Scale: Fair Sitting balance - Comments: Initially using L UE on bed rail but progressed to static sit without suppor at EOB.  With dynamic balance requiring assist   Standing balance support: Single extremity supported Standing balance-Leahy Scale: Poor Standing balance comment: reliant on UE support and external assist; heavy use of L UE, R UE placed on STEDY bar; min guard for balance  Cognition Arousal/Alertness: Awake/alert Behavior During Therapy: Impulsive Overall Cognitive Status: Impaired/Different from baseline Area of Impairment: Attention;Safety/judgement;Awareness                   Current Attention Level: Selective     Safety/Judgement: Decreased awareness of  safety;Decreased awareness of deficits Awareness: Emergent   General Comments: Pt very impulsive requiring frequent cues to slow down, listen, and for controlled movements.        Exercises General Exercises - Lower Extremity Ankle Circles/Pumps: PROM;Both;20 reps;Supine (facilitation: tapping, AROM on opposite side, verbal cues) Long Arc Quad: PROM;Seated;20 reps (facilitation: tapping, AROM on opposite side, verbal cues, also tried drop/catch to see if would activate) Other Exercises Other Exercises: Plantar flexion stretch 30xsec x 3 on R - educated on self stretch and PROM with gait belt Other Exercises: 1x10 PROM R UE: shoulder shrug, elbow flex/ext in gravity eliminated position, wrist/flex ext and finger flex ext gravity eliminated    General Comments General comments (skin integrity, edema, etc.): VSS      Pertinent Vitals/Pain Pain Assessment: No/denies pain    Home Living                          Prior Function            PT Goals (current goals can now be found in the care plan section) Progress towards PT goals: Progressing toward goals    Frequency    Min 4X/week      PT Plan Current plan remains appropriate    Co-evaluation              AM-PAC PT "6 Clicks" Mobility   Outcome Measure  Help needed turning from your back to your side while in a flat bed without using bedrails?: A Little Help needed moving from lying on your back to sitting on the side of a flat bed without using bedrails?: A Little Help needed moving to and from a bed to a chair (including a wheelchair)?: Total Help needed standing up from a chair using your arms (e.g., wheelchair or bedside chair)?: Total (relied on STEDY) Help needed to walk in hospital room?: Total Help needed climbing 3-5 steps with a railing? : Total 6 Click Score: 10    End of Session Equipment Utilized During Treatment: Gait belt Activity Tolerance: Patient tolerated treatment well Patient  left: in chair;with call bell/phone within reach;with chair alarm set;with family/visitor present Nurse Communication: Mobility status PT Visit Diagnosis: Unsteadiness on feet (R26.81);Hemiplegia and hemiparesis;Difficulty in walking, not elsewhere classified (R26.2) Hemiplegia - Right/Left: Right Hemiplegia - dominant/non-dominant: Non-dominant Hemiplegia - caused by: Nontraumatic intracerebral hemorrhage     Time: 1105-1147 PT Time Calculation (min) (ACUTE ONLY): 42 min  Charges:  $Therapeutic Exercise: 8-22 mins $Therapeutic Activity: 8-22 mins $Neuromuscular Re-education: 8-22 mins                     Anise Salvo, PT Acute Rehab Services Pager [phone] [facility] [phone]    Rayetta Humphrey 06/18/2021, 12:16 PM

## 2021-06-18 NOTE — Plan of Care (Signed)
°  Problem: Education: Goal: Knowledge of disease or condition will improve Outcome: Progressing Goal: Knowledge of secondary prevention will improve (SELECT ALL) Outcome: Progressing Goal: Knowledge of patient specific risk factors will improve (INDIVIDUALIZE FOR PATIENT) Outcome: Progressing Goal: Individualized Educational Video(s) Outcome: Progressing   Problem: Health Behavior/Discharge Planning: Goal: Ability to manage health-related needs will improve Outcome: Progressing   Problem: Self-Care: Goal: Ability to participate in self-care as condition permits will improve Outcome: Progressing Goal: Ability to communicate needs accurately will improve Outcome: Progressing   Problem: Nutrition: Goal: Risk of aspiration will decrease Outcome: Progressing   Problem: Intracerebral Hemorrhage Tissue Perfusion: Goal: Complications of Intracerebral Hemorrhage will be minimized Outcome: Progressing

## 2021-06-18 NOTE — H&P (Signed)
Physical Medicine and Rehabilitation Admission H&P    Chief Complaint  Patient presents with   Code Stroke  : HPI: Wayne Mendez is a 55 year old right-handed male with history of hypertension.  Per chart review patient lives with spouse.  1 level home.  Independent prior to admission.  Presented 06/15/2021 with acute onset of right side weakness and slurred speech.  Blood pressure 240/110.  CT/MRI shows intraparenchymal hemorrhage centered in the left basal ganglia extending into the left frontal and temporal lobes with mild surrounding edema and local mass-effect causing approximate 3 mm left-to-right midline shift.  Echocardiogram ejection fraction 65 to 70% no wall motion abnormalities.  Admission chemistries unremarkable glucose 123, alcohol negative, urine drug screen negative.  Neurology service follow-up conservative care close monitoring of blood pressure initially maintained on Cleviprex.  He was cleared to begin subcutaneous heparin for DVT prophylaxis 06/17/2021.  Tolerating a regular diet.  Therapy evaluations completed due to patient's right side weakness and slurred speech was admitted for a comprehensive rehab program.  Review of Systems  Constitutional:  Negative for chills and fever.  HENT:  Negative for hearing loss.   Eyes:  Negative for blurred vision and double vision.  Respiratory:  Negative for cough and shortness of breath.   Cardiovascular:  Negative for chest pain, palpitations and leg swelling.  Gastrointestinal:  Positive for constipation. Negative for heartburn, nausea and vomiting.  Genitourinary:  Negative for dysuria, flank pain and hematuria.  Musculoskeletal:  Positive for myalgias.  Skin:  Negative for rash.  Neurological:  Positive for speech change and weakness.  All other systems reviewed and are negative. Past Medical History:  Diagnosis Date   Hypertension    Past Surgical History:  Procedure Laterality Date   TYMPANOSTOMY TUBE PLACEMENT      History reviewed. No pertinent family history. Social History:  reports that he has never smoked. He has never used smokeless tobacco. He reports current alcohol use. He reports that he does not currently use drugs. Allergies: No Known Allergies Medications Prior to Admission  Medication Sig Dispense Refill   ibuprofen (ADVIL) 200 MG tablet Take 400 mg by mouth every 6 (six) hours as needed for fever, headache or mild pain.     pseudoephedrine (SUDAFED) 120 MG 12 hr tablet Take 120 mg by mouth 2 (two) times daily as needed for congestion.      Drug Regimen Review Drug regimen was reviewed and remains appropriate with no significant issues identified.  Home: Home Living Family/patient expects to be discharged to:: Private residence Living Arrangements: Spouse/significant other, Children Available Help at Discharge: Available PRN/intermittently, Family Type of Home: House Home Access: Stairs to enter Home Layout: One level Bathroom Shower/Tub: Holiday representative Accessibility: Yes Home Equipment: None Additional Comments: need to confirm info due to pt's cognition and aphasia  Lives With: Spouse, Son   Functional History: Prior Function Prior Level of Function : Working/employed, Driving, Independent/Modified Independent  Functional Status:  Mobility: Bed Mobility Overal bed mobility: Needs Assistance Bed Mobility: Supine to Sit Supine to sit: Min assist General bed mobility comments: Pt impulsive and moving quickly.  Kept trying to sit and move R LE with L UE but kept tipping over to R side.  REquired max multimodal cues and assist to stop and perform controlled movements including moving R LE over before trying to sit in order to maintain balance Transfers Overall transfer level: Needs assistance Equipment used: Ambulation equipment used Transfers: Sit to/from Stand Sit to Stand: Min  guard Bed to/from chair/wheelchair/BSC transfer type:: Via Lift equipment Step  pivot transfers: Max assist, +2 physical assistance (no initiation of moving R leg) Transfer via Lift Equipment: [place] transfer comment: Pt performed at least 6 sit to stands during session from bed or STEDY.  Only required min guard but heavy dependence on L leg and UE. Stedy for pivot. Ambulation/Gait General Gait Details: unable to perform safely at this time Pre-gait activities: Majority of session focused on pre -gait/standing activities in [place].  Performed 3x10 mini-squats and 3x10 weight shifting with frequent cues for controlled motion, therapist providing support at R knee with facilitation to activated quads/straighten knee, and heavy reliance on L side.  On last bout of weight shifting, improved control but still no noted active quad contraction.  1x10 lifting R heel with mod A - slight assist from hip flex    ADL: ADL Overall ADL's : Needs assistance/impaired Eating/Feeding: Minimal assistance, Sitting Eating/Feeding Details (indicate cue type and reason): dys 2; honey thick liquids Grooming: Moderate assistance Upper Body Bathing: Moderate assistance Lower Body Bathing: Moderate assistance, Bed level, Sitting/lateral leans Upper Body Dressing : Moderate assistance Lower Body Dressing: Maximal assistance Toilet Transfer: +2 for physical assistance, Minimal assistance Toilet Transfer Details (indicate cue type and reason): attempting with stedy simulated Toileting - Clothing Manipulation Details (indicate cue type and reason): using urinal; max A Functional mobility during ADLs: Maximal assistance, +2 for physical assistance  Cognition: Cognition Overall Cognitive Status: Impaired/Different from baseline Arousal/Alertness: Awake/alert Orientation Level: Oriented X4 Attention: Focused, Sustained Focused Attention: Appears intact Sustained Attention: Appears intact Awareness: Impaired Awareness Impairment: Intellectual impairment, Emergent impairment Problem  Solving: Impaired Problem Solving Impairment: Functional basic, Verbal basic Safety/Judgment: Impaired Cognition Arousal/Alertness: Awake/alert Behavior During Therapy: Impulsive Overall Cognitive Status: Impaired/Different from baseline Area of Impairment: Attention, Safety/judgement, Awareness Current Attention Level: Selective Safety/Judgement: Decreased awareness of safety, Decreased awareness of deficits Awareness: Emergent General Comments: Pt very impulsive requiring frequent cues to slow down, listen, and for controlled movements. Difficult to assess due to: Impaired communication  Physical Exam: Blood pressure (!) 151/96, pulse 73, temperature (!) 97.5 F (36.4 C), temperature source Oral, resp. rate 16, height 6' (1.829 m), weight 96.8 kg, SpO2 100 %. Physical Exam Constitutional:      Appearance: Normal appearance.  HENT:     Head: Normocephalic and atraumatic.     Right Ear: External ear normal.     Left Ear: External ear normal.     Nose: Nose normal.     Mouth/Throat:     Mouth: Mucous membranes are moist.  Eyes:     Conjunctiva/sclera: Conjunctivae normal.  Cardiovascular:     Rate and Rhythm: Normal rate and regular rhythm.     Heart sounds: No murmur heard.   No gallop.  Pulmonary:     Effort: Pulmonary effort is normal. No respiratory distress.     Breath sounds: No wheezing or rales.  Abdominal:     General: There is no distension.     Palpations: Abdomen is soft.     Tenderness: There is no abdominal tenderness.  Musculoskeletal:        General: No swelling or tenderness. Normal range of motion.     Cervical back: Normal range of motion.  Skin:    General: Skin is warm and dry.  Neurological:     Mental Status: He is alert.     Comments: Alert. Expressive language deficits, non-fluent.  Some receptive deficits as well, likely apraxic, too. Able to  tell me where he is with verbal cueing and provide some basic biographical information. Right central 7  and tongue deviation. Right arm and leg 1/2 sensation for LT and PP. RUE trace deltoid but otherwise 0/5.  RLE 1/5 HE, HAD, trace to absent distally. DTR's trace. No resting tone. LUE and LLE 5/5 with normal sensation  Psychiatric:        Mood and Affect: Mood normal.        Behavior: Behavior normal.    Results for orders placed or performed during the hospital encounter of 06/15/21 (from the past 48 hour(s))  CBC     Status: Abnormal   Collection Time: 06/18/21  3:00 AM  Result Value Ref Range   WBC 13.0 (H) 4.0 - 10.5 K/uL   RBC 5.01 4.22 - 5.81 MIL/uL   Hemoglobin 14.6 13.0 - 17.0 g/dL   HCT 09.3 81.8 - 29.9 %   MCV 88.2 80.0 - 100.0 fL   MCH 29.1 26.0 - 34.0 pg   MCHC 33.0 30.0 - 36.0 g/dL   RDW 37.1 69.6 - 78.9 %   Platelets 160 150 - 400 K/uL   nRBC 0.0 0.0 - 0.2 %    Comment: Performed at [facility], [address], [place], [place] [address]  Basic metabolic panel     Status: Abnormal   Collection Time: 06/18/21  3:00 AM  Result Value Ref Range   Sodium 140 135 - 145 mmol/L   Potassium 3.2 (L) 3.5 - 5.1 mmol/L   Chloride 106 98 - 111 mmol/L   CO2 24 22 - 32 mmol/L   Glucose, Bld 100 (H) 70 - 99 mg/dL    Comment: Glucose reference range applies only to samples taken after fasting for at least 8 hours.   BUN 15 6 - 20 mg/dL   Creatinine, Ser 7.51 0.61 - 1.24 mg/dL   Calcium 8.5 (L) 8.9 - 10.3 mg/dL   GFR, Estimated >02 >58 mL/min    Comment: (NOTE) Calculated using the CKD-EPI Creatinine Equation (2021)    Anion gap 10 5 - 15    Comment: Performed at [facility], [address], [place], [place] [address]  CBC     Status: Abnormal   Collection Time: 06/19/21  2:37 AM  Result Value Ref Range   WBC 11.8 (H) 4.0 - 10.5 K/uL   RBC 5.11 4.22 - 5.81 MIL/uL   Hemoglobin 14.7 13.0 - 17.0 g/dL   HCT 24.2 35.3 - 61.4 %   MCV 88.5 80.0 - 100.0 fL   MCH 28.8 26.0 - 34.0 pg   MCHC 32.5 30.0 - 36.0 g/dL   RDW 43.1 54.0 - 08.6 %   Platelets 174 150 - 400 K/uL    nRBC 0.0 0.0 - 0.2 %    Comment: Performed at [facility], [address] [place], [place] [address]  Basic metabolic panel     Status: Abnormal   Collection Time: 06/19/21  2:37 AM  Result Value Ref Range   Sodium 138 135 - 145 mmol/L   Potassium 3.6 3.5 - 5.1 mmol/L   Chloride 106 98 - 111 mmol/L   CO2 26 22 - 32 mmol/L   Glucose, Bld 95 70 - 99 mg/dL    Comment: Glucose reference range applies only to samples taken after fasting for at least 8 hours.   BUN 19 6 - 20 mg/dL   Creatinine, Ser 0.93 (H) 0.61 - 1.24 mg/dL  Calcium 8.7 (L) 8.9 - 10.3 mg/dL   GFR, Estimated >60 >60 mL/min    Comment: (NOTE) Calculated using the CKD-EPI Creatinine Equation (2021)    Anion gap 6 5 - 15    Comment: Performed at [facility], [place] [address] [place], [place] [address]   DG Swallowing Func-Speech Pathology  Result Date: 06/17/2021 Table formatting from the original result was not included. Objective Swallowing Evaluation: Type of Study: MBS-Modified Barium Swallow Study  Patient Details Name: Wayne Mendez MRN: [id number] Date of Birth: [birth year] Today's Date: 06/17/2021 Time: SLP Start Time (ACUTE ONLY): 0830 -SLP Stop Time (ACUTE ONLY): 0843 SLP Time Calculation (min) (ACUTE ONLY): 13 min Past Medical History: Past Medical History: Diagnosis Date  Hypertension  Past Surgical History: Past Surgical History: Procedure Laterality Date  TYMPANOSTOMY TUBE PLACEMENT   HPI: Angle Herod is a 55 y.o. male PMHx HTN (untreated) with acute left basal ganglia intraparenchymal hemorrhage.  No data recorded  Recommendations for follow up therapy are one component of a multi-disciplinary discharge planning process, led by the attending physician.  Recommendations may be updated based on patient status, additional functional criteria and insurance authorization. Assessment / Plan / Recommendation Clinical Impressions 06/17/2021 Clinical Impression Pt demosntrates mild sensory impairment leading to  delayed swallow initaition with all trials, despite this, even with mixed consistency and pressure to drink consecutively, at worst, pt only had trace flash penetration with thin liquids. No residuals. Pt able to attempt regualr solids and thin liquids. May need reminders for lingual sweep/oral care after meals given sensory changes on the right. Suspect pt may have intermittent cough with intake if he is drinking very quickly.  Will f/u for tolerance. SLP Visit Diagnosis Dysphagia, oropharyngeal phase (R13.12) Attention and concentration deficit following -- Frontal lobe and executive function deficit following -- Impact on safety and function Mild aspiration risk   Treatment Recommendations 06/17/2021 Treatment Recommendations Therapy as outlined in treatment plan below   Prognosis 06/17/2021 Prognosis for Safe Diet Advancement Good Barriers to Reach Goals Language deficits;Cognitive deficits Barriers/Prognosis Comment -- Diet Recommendations 06/17/2021 SLP Diet Recommendations Regular solids;Thin liquid Liquid Administration via Cup;Straw Medication Administration Whole meds with liquid Compensations Slow rate;Small sips/bites Postural Changes Seated upright at 90 degrees   Other Recommendations 06/17/2021 Recommended Consults -- Oral Care Recommendations Oral care before and after PO Other Recommendations Have oral suction available Follow Up Recommendations Acute inpatient rehab (3hours/day) Assistance recommended at discharge Frequent or constant Supervision/Assistance Functional Status Assessment Patient has had a recent decline in their functional status and demonstrates the ability to make significant improvements in function in a reasonable and predictable amount of time. Frequency and Duration  06/17/2021 Speech Therapy Frequency (ACUTE ONLY) min 2x/week Treatment Duration 2 weeks   Oral Phase 06/17/2021 Oral Phase Impaired Oral - Pudding Teaspoon -- Oral - Pudding Cup -- Oral - Honey Teaspoon -- Oral -  Honey Cup -- Oral - Nectar Teaspoon WFL Oral - Nectar Cup Right anterior bolus loss Oral - Nectar Straw Right anterior bolus loss Oral - Thin Teaspoon -- Oral - Thin Cup Right anterior bolus loss Oral - Thin Straw Right anterior bolus loss Oral - Puree WFL Oral - Mech Soft -- Oral - Regular WFL Oral - Multi-Consistency -- Oral - Pill -- Oral Phase - Comment --  Pharyngeal Phase 06/17/2021 Pharyngeal Phase Impaired Pharyngeal- Pudding Teaspoon -- Pharyngeal -- Pharyngeal- Pudding Cup -- Pharyngeal -- Pharyngeal- Honey Teaspoon -- Pharyngeal -- Pharyngeal- Honey Cup -- Pharyngeal -- Pharyngeal- Nectar Teaspoon  Delayed swallow initiation-vallecula Pharyngeal -- Pharyngeal- Nectar Cup Delayed swallow initiation-pyriform sinuses Pharyngeal -- Pharyngeal- Nectar Straw Delayed swallow initiation-pyriform sinuses Pharyngeal -- Pharyngeal- Thin Teaspoon -- Pharyngeal -- Pharyngeal- Thin Cup Delayed swallow initiation-pyriform sinuses;Penetration/Aspiration before swallow Pharyngeal Material enters airway, remains ABOVE vocal cords then ejected out;Material does not enter airway Pharyngeal- Thin Straw Delayed swallow initiation-pyriform sinuses;Penetration/Aspiration before swallow Pharyngeal Material enters airway, remains ABOVE vocal cords then ejected out;Material does not enter airway Pharyngeal- Puree Delayed swallow initiation-vallecula Pharyngeal -- Pharyngeal- Mechanical Soft -- Pharyngeal -- Pharyngeal- Regular Delayed swallow initiation-vallecula Pharyngeal -- Pharyngeal- Multi-consistency -- Pharyngeal -- Pharyngeal- Pill -- Pharyngeal -- Pharyngeal Comment --  No flowsheet data found. DeBlois, Katherene Ponto 06/17/2021, 10:38 AM                         Medical Problem List and Plan: 1. Functional deficits right side weakness and aphasia secondary to left basal ganglia ICH due to uncontrolled hypertension  -patient may shower  -ELOS/Goals: 13-20 days, mod I to set up with mobility and self-care and mod I  to supervision with language  -PRAFO/WHO for right side 2.  Antithrombotics: -DVT/anticoagulation:  Pharmaceutical: Heparin  -antiplatelet therapy: N/A 3. Pain Management: Tylenol as needed 4. Mood: Provide emotional support  -antipsychotic agents: N/A 5. Neuropsych: This patient is capable of making decisions on his own behalf. 6. Skin/Wound Care: Routine skin checks 7. Fluids/Electrolytes/Nutrition: Routine in and outs with follow-up chemistries 8.  Hypertension.  Norvasc 10 mg daily, lisinopril 20 mg twice daily, Lopressor 75 mg twice daily.    12/30 poorly controlled at present 9.  Hyperlipidemia.  Hold due to IPH.     Lavon Paganini Angiulli, PA-C 06/19/2021

## 2021-06-18 NOTE — Progress Notes (Signed)
STROKE TEAM PROGRESS NOTE   INTERVAL HISTORY No family is at the bedside.  Patient sitting in chair, AAOx3, still have right facial droop and right hemiplegia. Speech improved but still has mild expressive aphasia with intermittent word salad. Pending CIR and insurance approval.   Vitals:   06/17/21 2321 06/18/21 0403 06/18/21 0744 06/18/21 1335  BP: (!) 148/100 (!) 150/99 (!) 156/104 (!) 146/95  Pulse: 71 78 77 70  Resp: 18 17 17 17   Temp: 98.9 F (37.2 C) 98.1 F (36.7 C) 98 F (36.7 C) 98.2 F (36.8 C)  TempSrc: Oral Oral Oral Oral  SpO2: 96% 97% 96% 99%  Weight:      Height:       CBC:  Recent Labs  Lab 06/15/21 1350 06/17/21 0416 06/18/21 0300  WBC 9.7 13.1* 13.0*  NEUTROABS 7.7  --   --   HGB 15.2 15.2 14.6  HCT 45.0 45.3 44.2  MCV 87.9 87.8 88.2  PLT 160 168 160   Basic Metabolic Panel:  Recent Labs  Lab 06/15/21 1420 06/15/21 1602 06/15/21 1815 06/17/21 0416 06/18/21 0300  NA  --   --    < > 143 140  K  --   --   --  3.5 3.2*  CL  --   --   --  109 106  CO2  --   --   --  22 24  GLUCOSE  --   --   --  105* 100*  BUN  --   --   --  8 15  CREATININE  --   --   --  1.01 1.14  CALCIUM  --   --   --  8.4* 8.5*  MG 2.0 2.0  --   --   --   PHOS 1.5* 1.4*  --  2.6  --    < > = values in this interval not displayed.   Lipid Panel:  Recent Labs  Lab 06/15/21 1420  CHOL 173  TRIG 170*  HDL 36*  CHOLHDL 4.8  VLDL 34  LDLCALC 06/17/21*   HgbA1c:  Recent Labs  Lab 06/15/21 1413  HGBA1C 5.7*   Urine Drug Screen:  Recent Labs  Lab 06/15/21 1357  LABOPIA NONE DETECTED  COCAINSCRNUR NONE DETECTED  LABBENZ NONE DETECTED  AMPHETMU NONE DETECTED  THCU NONE DETECTED  LABBARB NONE DETECTED    Alcohol Level  Recent Labs  Lab 06/15/21 1357  ETH <10    IMAGING past 24 hours No results found.  PHYSICAL EXAM  Temp:  [98 F (36.7 C)-98.9 F (37.2 C)] 98.2 F (36.8 C) (12/29 1335) Pulse Rate:  [70-90] 70 (12/29 1335) Resp:  [16-18] 17 (12/29  1335) BP: (146-156)/(90-104) 146/95 (12/29 1335) SpO2:  [96 %-99 %] 99 % (12/29 1335)  General - Well nourished, well developed, in no apparent distress. Ophthalmologic - fundi not visualized due to noncooperation. Cardiovascular - Tachycardic, hypertensive  Mental Status -  Patient is alert and oriented to self and time. He is able to answer other orientation questions with verbal cues and prompting.  Moderate fluent aphasia with expressive deficits. He is able to repeat words and phrases, but he makes paraphasic errors and word salad intermittently.  When asked his son's name he states his name, but is able to correct himself with prompting. Names his wife correctly.   Cranial Nerves II - XII - II - Visual field intact OU.  III, IV, VI - Extraocular movements intact. V -  Facial sensation intact bilaterally. VII - Right facial droop VIII - Hearing & vestibular intact bilaterally. X - Palate elevates symmetrically. XI - Head is midline XII - Tongue protrusion intact.  Motor Strength - Weakness in right extremities, RUE 0/5, RLE 2-/5- slight lateral movement  Full strength in left upper and lower extremity  Motor Tone - Muscle tone was assessed at the neck and appendages and was normal. Reflexes - The patients reflexes were symmetrical in all extremities and he had no pathological reflexes. Sensory - No sensation in right arm or right leg Coordination - The patient had normal movements in the left hand with no ataxia or dysmetria.  Tremor was absent. Deferred in Rt hand due to weakness Gait and Station - deferred.   ASSESSMENT/PLAN [name] is a 55 y.o. male with history of HTN, non compliant with medications, presenting with right sided weakness and slurred speech. He was walking into the gym when this occurred. EMS was called and his BP on the scene was 240/110. Initial NIH 11. Initial CT scan shows IPH in left basal ganglia and external capsule region. Repeat CT stable  and CTA showed no LVO, aneurysm, or vascular malformation. MRI pending. SLP to do swallow and cognitive evaluation today. Sart PO blood pressure medications to wean off cleviprex infusion. Plan to titrate off 3% today and then start NS until adequate PO intake.   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 Focal hematoma with estimated volume of 23 cc. Mild surrounding edema and mass effect.  CTA head & neck -No intracranial large vessel occlusion or stenosis. no aneurysm or vascular malformation. MRI left BG ICH, stable, no mass lesion or enhancement 2D Echo EF 65-70% LDL 103 HgbA1c 5.7 VTE prophylaxis -Heparin subcu No antithrombotic prior to admission, now on No antithrombotic due to IPH Localized cerebral Edema, was on 3% saline - titrate down to discontinue Therapy recommendations: CIR Disposition:  Pending  Hypertensive emergency Home meds:  none, noncompliant Stable now off cleviprex   BP goal < 160 On llisinopril 20 bid and norvasc 10 on metoprolol 50 bid ->75 bid  Hyperlipidemia Home meds:  None LDL 103, goal < 70 Hold due to IPH Consider low dose statin at discharge  Other Stroke Risk Factors ETOH use, alcohol level <10, advised to drink no more than 2 drink(s) a day Tachycardia resolved after metoprolol addition  Other Active Problems   Hospital day # 3  Neurology will sign off. Please call with questions. Pt will follow up with stroke clinic NP at [facility] in about 4 weeks. Thanks for the consult.   Marvel Plan, MD PhD Stroke Neurology 06/18/2021 2:26 PM   To contact Stroke Continuity provider, please refer to [email or web address]. After hours, contact General Neurology

## 2021-06-18 NOTE — Progress Notes (Signed)
PROGRESS NOTE  Wayne Mendez QAS:[id number] DOB: 09-24-1965 DOA: 06/15/2021 PCP: Pcp, No   LOS: 3 days   Brief Narrative / Interim history: 55 year old male with HTN, nonadherence to medications comes into the hospital with slurred speech and right-sided weakness.  This happened while he was walking to the gym.  EMS was called, blood pressure on the scene was 240/110.  CT scan on admission showed intraparenchymal hemorrhage in the left basal ganglia and external capsule region.  He was admitted to the neuro ICU, stabilized, and transferred to the hospitalist service on 12/29.  Subjective / 24h Interval events: Doing very well this morning, no complaints.  Can move his right leg a little bit more, right arm still flaccid.  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  Follow-up imaging with MRI showed stability into his basal ganglia ICH.  Underwent stroke work-up, 2D echo showed an EF of 65-70%.  LDL was 103.  A1c 5.7.  He is pending CIR  Active Problems Hypertensive emergency-no meds at home, nonadherent to antihypertensive regimen.  Currently on lisinopril, metoprolol, amlodipine with improvement in his blood pressure.  Continue.  Blood pressure goal less than 160  Hyperlipidemia-consider statin at discharge per neurology  Hypokalemia-replete  Leukocytosis-likely reactive  Scheduled Meds:  amLODipine  10 mg Oral Daily   Chlorhexidine Gluconate Cloth  6 each Topical Daily   heparin injection (subcutaneous)  5,000 Units Subcutaneous Q8H   lisinopril  20 mg Oral BID   metoprolol tartrate  75 mg Oral BID   pantoprazole  40 mg Oral Daily   senna-docusate  1 tablet Oral BID   Continuous Infusions: PRN Meds:.acetaminophen **OR** acetaminophen (TYLENOL) oral liquid 160 mg/5 mL **OR** acetaminophen, labetalol  Diet Orders (From admission, onward)     Start     Ordered   06/17/21 0957  Diet regular Room service appropriate?  Yes; Fluid consistency: Thin  Diet effective now       Question Answer Comment  Room service appropriate? Yes   Fluid consistency: Thin      06/17/21 0956            DVT prophylaxis: heparin injection 5,000 Units Start: 06/17/21 1430 SCD's Start: 06/15/21 1417     Code Status: Full Code  Family Communication: no family at bedside   Status is: Inpatient  Remains inpatient appropriate because: CIR pending  Level of care: Telemetry Medical  Consultants:  Neurology   Procedures:  2D echo  Microbiology  none  Antimicrobials: none    Objective: Vitals:   06/17/21 1824 06/17/21 2321 06/18/21 0403 06/18/21 0744  BP: (!) 154/90 (!) 148/100 (!) 150/99 (!) 156/104  Pulse: 90 71 78 77  Resp: 16 18 17 17   Temp: 98.7 F (37.1 C) 98.9 F (37.2 C) 98.1 F (36.7 C) 98 F (36.7 C)  TempSrc: Oral Oral Oral Oral  SpO2: 98% 96% 97% 96%  Weight:      Height:        Intake/Output Summary (Last 24 hours) at 06/18/2021 1128 Last data filed at 06/18/2021 0800 Gross per 24 hour  Intake 16.73 ml  Output 400 ml  Net -383.27 ml   Filed Weights   06/15/21 1522  Weight: 96.8 kg    Examination:  Constitutional: NAD Eyes: no scleral icterus ENMT: Mucous membranes are moist.  Neck: normal, supple Respiratory: clear to auscultation bilaterally, no wheezing, no crackles.  Cardiovascular: Regular rate and rhythm, no murmurs / rubs / gallops. No  LE edema. Abdomen: non distended, no tenderness. Bowel sounds positive.  Musculoskeletal: no clubbing / cyanosis.  Skin: no rashes Neurologic: flaccid right arm, right facial droop. Good strength oon left    Data Reviewed: I have independently reviewed following labs and imaging studies   CBC: Recent Labs  Lab 06/15/21 1349 06/15/21 1350 06/17/21 0416 06/18/21 0300  WBC  --  9.7 13.1* 13.0*  NEUTROABS  --  7.7  --   --   HGB 15.3 15.2 15.2 14.6  HCT 45.0 45.0 45.3 44.2  MCV  --  87.9 87.8 88.2  PLT  --  160 168 160    Basic Metabolic Panel: Recent Labs  Lab 06/15/21 1349 06/15/21 1350 06/15/21 1420 06/15/21 1602 06/15/21 1815 06/16/21 0201 06/16/21 0717 06/17/21 0416 06/18/21 0300  NA 140 139  --   --  138 143 144 143 140  K 3.3* 3.5  --   --   --   --   --  3.5 3.2*  CL 105 107  --   --   --   --   --  109 106  CO2  --  22  --   --   --   --   --  22 24  GLUCOSE 122* 123*  --   --   --   --   --  105* 100*  BUN 16 14  --   --   --   --   --  8 15  CREATININE 1.00 1.14  --   --   --   --   --  1.01 1.14  CALCIUM  --  8.2*  --   --   --   --   --  8.4* 8.5*  MG  --   --  2.0 2.0  --   --   --   --   --   PHOS  --   --  1.5* 1.4*  --   --   --  2.6  --    Liver Function Tests: Recent Labs  Lab 06/15/21 1350  AST 20  ALT 21  ALKPHOS 52  BILITOT 1.1  PROT 6.5  ALBUMIN 3.8   Coagulation Profile: Recent Labs  Lab 06/15/21 1350  INR 1.0   HbA1C: Recent Labs    06/15/21 1413  HGBA1C 5.7*   CBG: No results for input(s): GLUCAP in the last 168 hours.  Recent Results (from the past 240 hour(s))  Resp Panel by RT-PCR (Flu A&B, Covid) Nasopharyngeal Swab     Status: None   Collection Time: 06/15/21  1:57 PM   Specimen: Nasopharyngeal Swab; Nasopharyngeal(NP) swabs in vial transport medium  Result Value Ref Range Status   SARS Coronavirus 2 by RT PCR NEGATIVE NEGATIVE Final    Comment: (NOTE) SARS-CoV-2 target nucleic acids are NOT DETECTED.  The SARS-CoV-2 RNA is generally detectable in upper respiratory specimens during the acute phase of infection. The lowest concentration of SARS-CoV-2 viral copies this assay can detect is 138 copies/mL. A negative result does not preclude SARS-Cov-2 infection and should not be used as the sole basis for treatment or other patient management decisions. A negative result may occur with  improper specimen collection/handling, submission of specimen other than nasopharyngeal swab, presence of viral mutation(s) within the areas targeted by  this assay, and inadequate number of viral copies(<138 copies/mL). A negative result must be combined with clinical observations, patient history, and epidemiological information. The expected result is Negative.  Fact Sheet for Patients:  BloggerCourse.com  Fact Sheet for Healthcare Providers:  SeriousBroker.it  This test is no t yet approved or cleared by the [place] FDA and  has been authorized for detection and/or diagnosis of SARS-CoV-2 by FDA under an Emergency Use Authorization (EUA). This EUA will remain  in effect (meaning this test can be used) for the duration of the COVID-19 declaration under Section 564(b)(1) of the Act, 21 U.S.C.section 360bbb-3(b)(1), unless the authorization is terminated  or revoked sooner.       Influenza A by PCR NEGATIVE NEGATIVE Final   Influenza B by PCR NEGATIVE NEGATIVE Final    Comment: (NOTE) The Xpert Xpress SARS-CoV-2/FLU/RSV plus assay is intended as an aid in the diagnosis of influenza from Nasopharyngeal swab specimens and should not be used as a sole basis for treatment. Nasal washings and aspirates are unacceptable for Xpert Xpress SARS-CoV-2/FLU/RSV testing.  Fact Sheet for Patients: BloggerCourse.com  Fact Sheet for Healthcare Providers: SeriousBroker.it  This test is not yet approved or cleared by the [place] FDA and has been authorized for detection and/or diagnosis of SARS-CoV-2 by FDA under an Emergency Use Authorization (EUA). This EUA will remain in effect (meaning this test can be used) for the duration of the COVID-19 declaration under Section 564(b)(1) of the Act, 21 U.S.C. section 360bbb-3(b)(1), unless the authorization is terminated or revoked.  Performed at [facility], [address] [place], [place] [address]   MRSA Next Gen by PCR, Nasal     Status: None   Collection Time: 06/15/21   3:38 PM   Specimen: Nasal Mucosa; Nasal Swab  Result Value Ref Range Status   MRSA by PCR Next Gen NOT DETECTED NOT DETECTED Final    Comment: (NOTE) The GeneXpert MRSA Assay (FDA approved for NASAL specimens only), is one component of a comprehensive MRSA colonization surveillance program. It is not intended to diagnose MRSA infection nor to guide or monitor treatment for MRSA infections. Test performance is not FDA approved in patients less than 2 years old. Performed at [facility], [address] [place], [place] [address]      Radiology Studies: No results found.   Pamella Pert, MD, PhD [facility]  Between 7 am - 7 pm I am available, please contact me via Amion (for emergencies) or Securechat (non urgent messages)  Between 7 pm - 7 am I am not available, please contact night coverage MD/APP via Amion

## 2021-06-18 NOTE — Progress Notes (Signed)
IP rehab admissions - I spoke with patient's wife by phone this am.  We are awaiting insurance authorization for potential acute inpatient rehab admission.  I will update all once I hear back from insurance case manager.  Call for questions.  [phone]

## 2021-06-19 ENCOUNTER — Inpatient Hospital Stay (HOSPITAL_COMMUNITY)
Admission: RE | Admit: 2021-06-19 | Discharge: 2021-07-10 | DRG: 057 | Disposition: A | Discharge status: Home / Self Care | Payer: 59 | Source: Intra-hospital | Attending: Physical Medicine and Rehabilitation | Admitting: Physical Medicine and Rehabilitation

## 2021-06-19 ENCOUNTER — Encounter (HOSPITAL_COMMUNITY): Payer: Self-pay | Admitting: Physical Medicine and Rehabilitation

## 2021-06-19 ENCOUNTER — Other Ambulatory Visit: Payer: Self-pay

## 2021-06-19 DIAGNOSIS — I610 Nontraumatic intracerebral hemorrhage in hemisphere, subcortical: Secondary | ICD-10-CM

## 2021-06-19 DIAGNOSIS — G8191 Hemiplegia, unspecified affecting right dominant side: Secondary | ICD-10-CM

## 2021-06-19 DIAGNOSIS — G4700 Insomnia, unspecified: Secondary | ICD-10-CM | POA: Diagnosis present

## 2021-06-19 DIAGNOSIS — I69220 Aphasia following other nontraumatic intracranial hemorrhage: Secondary | ICD-10-CM

## 2021-06-19 DIAGNOSIS — I619 Nontraumatic intracerebral hemorrhage, unspecified: Secondary | ICD-10-CM | POA: Diagnosis present

## 2021-06-19 DIAGNOSIS — R4587 Impulsiveness: Secondary | ICD-10-CM | POA: Diagnosis present

## 2021-06-19 DIAGNOSIS — R4701 Aphasia: Secondary | ICD-10-CM

## 2021-06-19 DIAGNOSIS — E785 Hyperlipidemia, unspecified: Secondary | ICD-10-CM | POA: Diagnosis present

## 2021-06-19 DIAGNOSIS — I69251 Hemiplegia and hemiparesis following other nontraumatic intracranial hemorrhage affecting right dominant side: Principal | ICD-10-CM

## 2021-06-19 DIAGNOSIS — R252 Cramp and spasm: Secondary | ICD-10-CM | POA: Diagnosis not present

## 2021-06-19 DIAGNOSIS — N179 Acute kidney failure, unspecified: Secondary | ICD-10-CM | POA: Diagnosis present

## 2021-06-19 DIAGNOSIS — I10 Essential (primary) hypertension: Secondary | ICD-10-CM | POA: Diagnosis present

## 2021-06-19 LAB — CBC
HCT: 45.2 % (ref 39.0–52.0)
Hemoglobin: 14.7 g/dL (ref 13.0–17.0)
MCH: 28.8 pg (ref 26.0–34.0)
MCHC: 32.5 g/dL (ref 30.0–36.0)
MCV: 88.5 fL (ref 80.0–100.0)
Platelets: 174 10*3/uL (ref 150–400)
RBC: 5.11 MIL/uL (ref 4.22–5.81)
RDW: 13.2 % (ref 11.5–15.5)
WBC: 11.8 10*3/uL — ABNORMAL HIGH (ref 4.0–10.5)
nRBC: 0 % (ref 0.0–0.2)

## 2021-06-19 LAB — BASIC METABOLIC PANEL
Anion gap: 6 (ref 5–15)
BUN: 19 mg/dL (ref 6–20)
CO2: 26 mmol/L (ref 22–32)
Calcium: 8.7 mg/dL — ABNORMAL LOW (ref 8.9–10.3)
Chloride: 106 mmol/L (ref 98–111)
Creatinine, Ser: 1.27 mg/dL — ABNORMAL HIGH (ref 0.61–1.24)
GFR, Estimated: >60 mL/min (ref >60)
Glucose, Bld: 95 mg/dL (ref 70–99)
Potassium: 3.6 mmol/L (ref 3.5–5.1)
Sodium: 138 mmol/L (ref 135–145)

## 2021-06-19 MED ORDER — HEPARIN SODIUM (PORCINE) 5000 UNIT/ML IJ SOLN: 5000.0000 [IU] | Freq: Three times a day (TID) | INTRAMUSCULAR | Status: DC

## 2021-06-19 MED ORDER — SENNOSIDES-DOCUSATE SODIUM 8.6-50 MG PO TABS
1.0000 | ORAL_TABLET | Freq: Two times a day (BID) | ORAL | Status: DC
  Administered 2021-06-19 – 2021-07-10 (×10): 1 via ORAL
  Filled 2021-06-19 (×33): qty 1

## 2021-06-19 MED ORDER — METOPROLOL TARTRATE 75 MG PO TABS: 75.0000 mg | ORAL_TABLET | Freq: Two times a day (BID) | ORAL | Status: DC

## 2021-06-19 MED ORDER — ACETAMINOPHEN 160 MG/5ML PO SOLN: 650.0000 mg | ORAL | Status: DC | PRN

## 2021-06-19 MED ORDER — AMLODIPINE BESYLATE 10 MG PO TABS: 10.0000 mg | ORAL_TABLET | Freq: Every day | ORAL | Status: DC

## 2021-06-19 MED ORDER — PANTOPRAZOLE SODIUM 40 MG PO TBEC
40.0000 mg | DELAYED_RELEASE_TABLET | Freq: Every day | ORAL | Status: DC
  Administered 2021-06-20 – 2021-07-10 (×21): 40 mg via ORAL
  Filled 2021-06-19 (×21): qty 1

## 2021-06-19 MED ORDER — METOPROLOL TARTRATE 50 MG PO TABS
75.0000 mg | ORAL_TABLET | Freq: Two times a day (BID) | ORAL | Status: DC
  Administered 2021-06-19 – 2021-06-20 (×2): 75 mg via ORAL
  Filled 2021-06-19 (×2): qty 1

## 2021-06-19 MED ORDER — HEPARIN SODIUM (PORCINE) 5000 UNIT/ML IJ SOLN
5000.0000 [IU] | Freq: Three times a day (TID) | INTRAMUSCULAR | Status: DC
  Administered 2021-06-19 – 2021-06-25 (×17): 5000 [IU] via SUBCUTANEOUS
  Filled 2021-06-19 (×17): qty 1

## 2021-06-19 MED ORDER — ATORVASTATIN CALCIUM 10 MG PO TABS: 10.0000 mg | ORAL_TABLET | Freq: Every day | ORAL | 11 refills | Status: DC

## 2021-06-19 MED ORDER — LISINOPRIL 20 MG PO TABS: 20.0000 mg | ORAL_TABLET | Freq: Two times a day (BID) | ORAL | Status: DC

## 2021-06-19 MED ORDER — LISINOPRIL 20 MG PO TABS
20.0000 mg | ORAL_TABLET | Freq: Two times a day (BID) | ORAL | Status: DC
  Administered 2021-06-19 – 2021-07-10 (×42): 20 mg via ORAL
  Filled 2021-06-19 (×42): qty 1

## 2021-06-19 MED ORDER — AMLODIPINE BESYLATE 10 MG PO TABS
10.0000 mg | ORAL_TABLET | Freq: Every day | ORAL | Status: DC
  Administered 2021-06-20 – 2021-07-10 (×21): 10 mg via ORAL
  Filled 2021-06-19 (×22): qty 1

## 2021-06-19 MED ORDER — ACETAMINOPHEN 325 MG PO TABS
650.0000 mg | ORAL_TABLET | ORAL | Status: DC | PRN
  Administered 2021-06-23 – 2021-06-25 (×3): 650 mg via ORAL
  Filled 2021-06-19 (×5): qty 2

## 2021-06-19 MED ORDER — ACETAMINOPHEN 650 MG RE SUPP: 650.0000 mg | RECTAL | Status: DC | PRN

## 2021-06-19 NOTE — Progress Notes (Signed)
Orthopedic Tech Progress Note Patient Details:  Wayne Mendez 1965/09/11 [id number]  Patient ID: Wayne Mendez, male   DOB: Jun 01, 1966, 55 y.o.   MRN: [id number] Placed order with [facility] for PRAFO and resting WHO.  Darleen Crocker 06/19/2021, 9:01 PM

## 2021-06-19 NOTE — Evaluation (Signed)
Occupational Therapy Assessment and Plan  Patient Details  Name: Wayne Mendez MRN: [id number] Date of Birth: [birth year]  OT Diagnosis: abnormal posture, cognitive deficits, hemiplegia affecting dominant side, and muscle weakness (generalized) Rehab Potential: Rehab Potential (ACUTE ONLY): Excellent ELOS: 3 to 3.5 weeks   Today's Date: 06/20/2021 OT Individual Time: 7062-3762 OT Individual Time Calculation (min): 76 min     Hospital Problem: Principal Problem:   ICH (intracerebral hemorrhage) ([place])   Past Medical History:  Past Medical History:  Diagnosis Date   Hypertension    Past Surgical History:  Past Surgical History:  Procedure Laterality Date   TYMPANOSTOMY TUBE PLACEMENT      Assessment & Plan Clinical Impression: Wayne Mendez is a 55 year old right-handed male with history of hypertension.  Per chart review patient lives with spouse.  1 level home.  Independent prior to admission.  Presented 06/15/2021 with acute onset of right side weakness and slurred speech.  Blood pressure 240/110.  CT/MRI shows intraparenchymal hemorrhage centered in the left basal ganglia extending into the left frontal and temporal lobes with mild surrounding edema and local mass-effect causing approximate 3 mm left-to-right midline shift.  Echocardiogram ejection fraction 65 to 70% no wall motion abnormalities.  Admission chemistries unremarkable glucose 123, alcohol negative, urine drug screen negative.  Neurology service follow-up conservative care close monitoring of blood pressure initially maintained on Cleviprex.  He was cleared to begin subcutaneous heparin for DVT prophylaxis 06/17/2021.  Tolerating a regular diet.  Therapy evaluations completed due to patient's right side weakness and slurred speech was admitted for a comprehensive rehab program.  Patient currently requires max with basic self-care skills secondary to muscle weakness and muscle paralysis,  , abnormal tone, unbalanced muscle  activation, and decreased coordination, decreased safety awareness, and decreased sitting balance, decreased standing balance, decreased postural control, hemiplegia, and decreased balance strategies.  Prior to hospitalization, patient could complete BADLs with independent .  Patient will benefit from skilled intervention to increase independence with basic self-care skills prior to discharge  home with family support .  Anticipate patient will require 24 hour supervision and follow up home health.  OT - End of Session Endurance Deficit: No OT Assessment Rehab Potential (ACUTE ONLY): Excellent OT Barriers to Discharge: Decreased caregiver support;Lack of/limited family support OT Patient demonstrates impairments in the following area(s): Warehouse manager;Behavior;Motor;Safety;Sensory OT Basic ADL's Functional Problem(s): Grooming;Bathing;Dressing;Toileting OT Advanced ADL's Functional Problem(s): Simple Meal Preparation OT Transfers Functional Problem(s): Toilet;Tub/Shower OT Additional Impairment(s): Fuctional Use of Upper Extremity OT Plan OT Intensity: Minimum of 1-2 x/day, 45 to 90 minutes OT Frequency: 5 out of 7 days OT Duration/Estimated Length of Stay: 3 to 3.5 weeks OT Treatment/Interventions: Balance/vestibular training;Disease mangement/prevention;Neuromuscular re-education;Self Care/advanced ADL retraining;Therapeutic Exercise;Wheelchair propulsion/positioning;UE/LE Strength taining/ROM;Pain management;DME/adaptive equipment instruction;Cognitive remediation/compensation;Community reintegration;Functional electrical stimulation;Patient/family education;Splinting/orthotics;UE/LE Coordination activities;Therapeutic Activities;Psychosocial support;Functional mobility training;Discharge planning OT Self Feeding Anticipated Outcome(s): No goal OT Basic Self-Care Anticipated Outcome(s): Supervision OT Toileting Anticipated Outcome(s): Supervision OT Bathroom Transfers Anticipated  Outcome(s): Supervision OT Recommendation Recommendations for Other Services: Other (comment) (n/a) Patient destination: Home Follow Up Recommendations: Home health OT Equipment Recommended: To be determined  OT Evaluation Precautions/Restrictions  Precautions Precautions: Fall Precaution Comments: Rt hemi Vital Signs Therapy Vitals Temp: 98.3 F (36.8 C) Temp Source: Oral Pulse Rate: 86 Resp: 16 BP: 131/83 Patient Position (if appropriate): Sitting Oxygen Therapy SpO2: 98 % O2 Device: Room Air Home Living/Prior [place] expects to be discharged to:: Private residence Living Arrangements: Spouse/significant other, Children Available Help at Discharge: Available PRN/intermittently,  Family Type of Home: House Home Layout: One level Bathroom Shower/Tub: Multimedia programmer: Programmer, systems: Yes  Lives With: Spouse, Son IADL History Homemaking Responsibilities: Yes (pt reports being fully independent with IADLs PTA, driving, working FT and going to the gym x2 weekly) Occupation: Full time employment 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[employer] and Hobbies: Going to the gym with his son Prior Function Level of Independence: Independent with basic ADLs, Independent with homemaking with ambulation Driving: Yes 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, etc)) Vision Baseline Vision/History: 1 Wears glasses Ability to See in Adequate Light: 0 Adequate Patient Visual Report: No change from baseline Vision Assessment?: No apparent visual deficits Perception  Perception: Impaired Inattention/Neglect: Does not attend to right visual field;Does not attend to right side of body Praxis Praxis: Impaired Praxis Impairment Details: Motor planning Comment: ?deficit with motor planning vs mild receptive aphasia Cognition Overall Cognitive  Status: Impaired/Different from baseline Arousal/Alertness: Awake/alert Orientation Level: Person;Place;Situation Person: Oriented Place: Oriented Situation: Oriented Year: 2022 Month: December Day of Week: Incorrect Memory: Appears intact Immediate Memory Recall: Sock;Blue;Bed Memory Recall Sock: Without Cue Memory Recall Blue: Without Cue Memory Recall Bed: Without Cue Attention: Sustained;Selective Sustained Attention: Appears intact Selective Attention: Impaired Selective Attention Impairment: Verbal complex;Verbal basic Awareness: Impaired Awareness Impairment: Emergent impairment Behaviors: Restless;Impulsive Safety/Judgment: Impaired Comments: Due to impulsivity and decreased insight into deficits Sensation Sensation Light Touch: Impaired Detail Light Touch Impaired Details: Impaired RLE;Absent RLE;Absent RUE;Impaired RUE (Pt with absent UE sensation from halfway down upper arm, absent LE sensation in stocking distribution) Coordination Gross Motor Movements are Fluid and Coordinated: No Fine Motor Movements are Fluid and Coordinated: No Coordination and Movement Description: Affected by Rt hemiparesis and balance deficits Finger Nose Finger Test: WNL Lt, unable to complete on the Rt Motor  Motor Motor: Hemiplegia;Abnormal tone;Abnormal postural alignment and control  Trunk/Postural Assessment  Cervical Assessment Cervical Assessment: Exceptions to [facility] (forward head) Thoracic Assessment Thoracic Assessment: Exceptions to [facility] (rounded shoulders) Lumbar Assessment Lumbar Assessment: Exceptions to [facility] (posterior pelvic tilt) Postural Control Postural Control: Deficits on evaluation (limited during dynamic sitting and dynamic standing self care activity)  Balance Balance Balance Assessed: Yes Dynamic Sitting Balance Dynamic Sitting - Balance Support: During functional activity Dynamic Sitting - Level of Assistance: 4: Min assist (washing Rt LE with limb in figure 4  position during bathing) Dynamic Standing Balance Dynamic Standing - Level of Assistance: 2: Max assist Dynamic Standing - Balance Activities: Lateral lean/weight shifting;Forward lean/weight shifting (completing perihygiene while standing in Stedy, large Rt lateral LOBs) Extremity/Trunk Assessment RUE Assessment RUE Assessment: Exceptions to [facility] Passive Range of Motion (PROM) Comments: WNL RUE Body System: Neuro Brunstrum levels for arm and hand: Arm;Hand Brunstrum level for arm: Stage II Synergy is developing Brunstrum level for hand: Stage I Flaccidity RUE PROM (degrees) Overall PROM Right Upper Extremity: Within functional limits for tasks performed RUE Tone RUE Tone: Hypotonic LUE Assessment LUE Assessment: Within Functional Limits Active Range of Motion (AROM) Comments: WNL  Care Tool Care Tool Self Care Eating    Setup assist    Oral Care    Oral Care Assist Level: Moderate Assistance - Patient 50 - 74%    Bathing   Body parts bathed by patient: Chest;Abdomen;Front perineal area;Buttocks;Right upper leg;Left upper leg;Right lower leg;Left lower leg;Face Body parts bathed by helper: Right arm;Left arm   Assist Level: Maximal Assistance - Patient 24 - 49% (Stedy used for  sit<stands)    Upper Body Dressing(including orthotics)   What is the patient wearing?: Pull over shirt   Assist Level: Minimal Assistance - Patient > 75%    Lower Body Dressing (excluding footwear)   What is the patient wearing?: Underwear/pull up;Pants Assist for lower body dressing: Maximal Assistance - Patient 25 - 49% (using Stedy)    Putting on/Taking off footwear   What is the patient wearing?: Non-skid slipper socks;Ted hose         Care Tool Toileting Toileting activity Toileting Activity did not occur (Clothing management and hygiene only): N/A (no void or bm)             Toilet transfer   Assist Level: Dependent - Patient 0% (using Stedy pt 50%)       Refer to Care Plan  for Long Term Goals  SHORT TERM GOAL WEEK 1 OT Short Term Goal 1 (Week 1): Pt will complete LB dressing at sit<stand level using RW during 2 consecutive ADL sessions OT Short Term Goal 2 (Week 1): Pt will complete 2 grooming tasks while standing at the sink using RW for standing support as needed OT Short Term Goal 3 (Week 1): Pt will complete 1/3 components of toileting with no more than Mod balance assistance  Recommendations for other services: None    Skilled Therapeutic Intervention Skilled OT session completed with focus on initial evaluation, education on OT role/POC, and establishment of patient-centered goals.   Pt greeted in bed with no c/o pain. Very motivated to participate, required cues for impulsivity throughout tx. He participated in BADLs EOB sit<stand using Stedy lift. Noted deficits with Rt inattention, Rt UE is nonfunctional during activity and pt requires hand over hand to incorporate. Dynamic balance deficits noted while EOB pt losing his balance posteriorly at times and also when standing in Stedy (laterally towards Rt). Pt requiring CGA-close supervision for dynamic sitting, Mod-Max balance assist in [place] when completing perihygiene and elevating LB garments over hips (Rt knee actively buckling during these aspects of self care). Initiated education regarding hemi dressing strategies, joint protection, and self ROM of the hand. Simulated toilet transfer completed with 1 assist using Stedy as well, worked on midline orientation with visual cues, also told him importance of trying to mindfully activate the Rt knee when using the Stedy to improve its stability/strength in order to progress to the RW with time. Pt transferred to the recliner at close of session, left with all needs within reach and safety belt fastened.   ADL ADL Eating: Set up Grooming: Moderate assistance Where Assessed-Grooming: Edge of bed Upper Body Bathing: Moderate assistance Where Assessed-Upper Body  Bathing: Edge of bed Lower Body Bathing: Maximal assistance Where Assessed-Lower Body Bathing: Edge of bed Upper Body Dressing: Minimal assistance Where Assessed-Upper Body Dressing: Chair Lower Body Dressing: Maximal assistance Where Assessed-Lower Body Dressing: Edge of bed Toileting: Not assessed Toilet Transfer: Dependent (using Stedy, pt 50%) Toilet Transfer Method: Stand pivot Science writer: Grab bars Tub/Shower Transfer: Not assessed ADL Comments: All sit<stands and functional transfers completed using Stedy    Discharge Criteria: Patient will be discharged from OT if patient refuses treatment 3 consecutive times without medical reason, if treatment goals not met, if there is a change in medical status, if patient makes no progress towards goals or if patient is discharged from hospital.  The above assessment, treatment plan, treatment alternatives and goals were discussed and mutually agreed upon: by patient  Skeet Simmer 06/20/2021, 3:39 PM

## 2021-06-19 NOTE — Progress Notes (Signed)
PMR Admission Coordinator Pre-Admission Assessment ° °Patient: Wayne Mendez is an 55 y.o., male °MRN: [id number] °DOB: 12/17/1965 °Height: 6' (182.9 cm) °Weight: 96.8 kg ° °Insurance Information °HMO:     PPO:      PCP:      IPA:      80/20: yes     OTHER:  °PRIMARY: [employer]      Policy#: [id number]      Subscriber: Pt  °CM Name: Cheryl Barrington       Phone#: [phone]  Fax#: [phone]  I received a message from Marilyn at [employer] on 06/18/21 stating Pt. Is approved for CIR. Aprroval dates begin on the day of admission and continue for 5 days, with updates due on the 5th day following admission. (Should be 06/24/20) ° Pre-Cert#: [id number]      Employer:  °Benefits:  Phone #:      Name:  °Eff Date: 11/19/2020- still active °Deductible: $5,000 ($2,398.95 met) °OOP Max: $8,150 ($2,626.94 met) °CIR: 50% coverage, 50% co-insurance °SNF: 50% coverage, 50% co-insurance, Limited to 60 Days Per Calendar Year combined with Habilitative Inpatient Services. °Outpatient:  $30 co-pay, 23 pt/ot visits/cal yr, 30 slp visits/cal yr °Home Health: 50% coverage, 50% co-insurance; limited to 60 visits/cal yr °Providers: in network  °SECONDARY: none       Policy#:      Phone#:  ° ° °Financial Counselor:       Phone#:  ° °The “Data Collection Information Summary” for patients in Inpatient Rehabilitation Facilities with attached “Privacy Act Statement-Health Care Records” was provided and verbally reviewed with: Patient ° °Emergency Contact Information °Contact Information   ° ° Name Relation Home Work Mobile  ° KIRBY,TRISHA Spouse   [phone]  ° °  ° ° °Current Medical History  °Patient Admitting Diagnosis: ICH °History of Present Illness: [name] is a 55 y.o. male with history of HTN, non compliant with medications, presenting with right sided weakness and slurred speech. He was walking into the gym when this occurred. EMS was called and his BP on the scene was 240/110. Initial NIH 11. 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 Repeat CT stable and CTA showed no LVO, aneurysm, or vascular malformation. 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 CIR consulted to assist return to PLOF.  °Complete NIHSS TOTAL: 19 ° °Patient's medical record from [facility]  has been reviewed by the rehabilitation admission coordinator and physician. ° °Past Medical History  °Past Medical History:  °Diagnosis Date  ° Hypertension   ° ° °Has the patient had major surgery during 100 days prior to admission? No ° °Family History   °family history is not on file. ° °Current Medications ° °Current Facility-Administered Medications:  °  acetaminophen (TYLENOL) tablet 650 mg, 650 mg, Oral, Q4H PRN, 650 mg at 06/17/21 0927 **OR** acetaminophen (TYLENOL) 160 MG/5ML solution 650 mg, 650 mg, Per Tube, Q4H PRN **OR** acetaminophen (TYLENOL) suppository 650 mg, 650 mg, Rectal, Q4H PRN, Collins, Hunter J, MD °  amLODipine (NORVASC) tablet 10 mg, 10 mg, Oral, Daily, Xu, Jindong, MD, 10 mg at 06/17/21 0927 °  Chlorhexidine Gluconate Cloth 2 % PADS 6 each, 6 each, Topical, Daily, Collins, Hunter J, MD, 6 each at 06/16/21 1114 °  heparin injection 5,000 Units, 5,000 Units, Subcutaneous, Q8H,   PADS 6 each, 6 each, Topical, Daily, Gwinda Maine, MD, 6 each at 06/16/21 1114   heparin injection 5,000 Units, 5,000 Units, Subcutaneous, Q8H, Rosalin Hawking, MD   labetalol (NORMODYNE) injection 10-20 mg, 10-20 mg, Intravenous, Q2H PRN, Rosalin Hawking, MD   lisinopril (ZESTRIL) tablet 20 mg, 20 mg, Oral, BID, Rosalin Hawking, MD, 20 mg at 06/17/21 9449   metoprolol tartrate (LOPRESSOR) tablet 50 mg, 50 mg, Oral, BID, Rosalin Hawking, MD, 50 mg at 06/17/21 6759   pantoprazole (PROTONIX) EC tablet 40 mg, 40 mg, Oral, Daily, Rosalin Hawking, MD, 40 mg at 06/17/21 1638   phosphorus (K PHOS NEUTRAL) tablet 500 mg, 500 mg, Oral, Once, Rosalin Hawking, MD    senna-docusate (Senokot-S) tablet 1 tablet, 1 tablet, Oral, BID, Gwinda Maine, MD, 1 tablet at 06/17/21 4665   Patients Current Diet:  Diet Order                  Diet regular Room service appropriate? Yes; Fluid consistency: Thin  Diet effective now                         Precautions / Restrictions Precautions Precautions: Fall Precaution Comments: SBP <160 Restrictions Weight Bearing Restrictions: No    Has the patient had 2 or more falls or a fall with injury in the past year? No   Prior Activity Level Community (5-7x/wk): Pt. working, active in the community PTA   Prior Functional Level Self Care: Did the patient need help bathing, dressing, using the toilet or eating? Independent   Indoor Mobility: Did the patient need assistance with walking from room to room (with or without device)? Independent   Stairs: Did the patient need assistance with internal or external stairs (with or without device)? Independent   Functional Cognition: Did the patient need help planning regular tasks such as shopping or remembering to take medications? Independent   Patient Information Are you of Hispanic, Latino/a,or Spanish origin?: A. No, not of Hispanic, Latino/a, or Spanish origin What is your race?: A. White Do you need or want an interpreter to communicate with a doctor or health care staff?: 0. No   Patient's Response To:  Health Literacy and Transportation Is the patient able to respond to health literacy and transportation needs?: Yes Health Literacy - How often do you need to have someone help you when you read instructions, pamphlets, or other written material from your doctor or pharmacy?: Never In the past 12 months, has lack of transportation kept you from medical appointments or from getting medications?: Yes In the past 12 months, has lack of transportation kept you from meetings, work, or from getting things needed for daily living?: Yes   Home Assistive  Devices / [place] Devices/Equipment: None Home Equipment: None   Prior Device Use: Indicate devices/aids used by the patient prior to current illness, exacerbation or injury? None of the above   Current Functional Level Cognition   Arousal/Alertness: Awake/alert Overall Cognitive Status: Impaired/Different from baseline Difficult to assess due to: Impaired communication Current Attention Level: Selective Orientation Level: Oriented X4 Safety/Judgement: Decreased awareness of safety, Decreased awareness of deficits General Comments: impulsive requiring cues for waiting for therapist and slowing down. Answering questions quickly with "yes" and "no". Speech unintelligible at times. A&Ox3 with various yes/no questions. Unable to determine if aware of situation Attention: Focused, Sustained Focused Attention: Appears intact Sustained Attention: Appears intact Awareness: Impaired Awareness Impairment: Intellectual impairment, Emergent impairment Problem  Solving: Impaired Problem Solving Impairment: Functional basic, Verbal basic Safety/Judgment: Impaired    Extremity Assessment (includes Sensation/Coordination)   Upper Extremity Assessment: RUE deficits/detail RUE Deficits / Details: flaccid no movement RUE Sensation: decreased light touch, decreased proprioception RUE Coordination: decreased fine motor, decreased gross motor  Lower Extremity Assessment: Defer to PT evaluation RLE Deficits / Details: no noted active mvmt of RLE, R knee buckling in standing, decreased sensation and joint sense noted RLE Sensation: decreased light touch, decreased proprioception RLE Coordination: decreased fine motor, decreased gross motor     ADLs   Overall ADL's : Needs assistance/impaired Eating/Feeding: Minimal assistance, Sitting Eating/Feeding Details (indicate cue type and reason): dys 2; honey thick liquids Grooming: Moderate assistance Upper Body Bathing: Moderate  assistance Lower Body Bathing: Moderate assistance, Bed level, Sitting/lateral leans Upper Body Dressing : Moderate assistance Lower Body Dressing: Maximal assistance Toilet Transfer: +2 for physical assistance, Minimal assistance Toilet Transfer Details (indicate cue type and reason): attempting with stedy simulated Toileting - Clothing Manipulation Details (indicate cue type and reason): using urinal; max A Functional mobility during ADLs: Maximal assistance, +2 for physical assistance     Mobility   Overal bed mobility: Needs Assistance Bed Mobility: Supine to Sit Supine to sit: Min assist, +2 for safety/equipment General bed mobility comments: Able to use L UE to assist with R LE. Instructed patient on using L LE to hook R LE to assist off bed. MinA to bring LE towards EOB. Patient reaching for foot board to assist with trunk elevation     Transfers   Overall transfer level: Needs assistance Equipment used: Ambulation equipment used Transfers: Sit to/from Stand, Bed to chair/wheelchair/BSC Sit to Stand: Min assist, +2 physical assistance Bed to/from chair/wheelchair/BSC transfer type:: Via Lift equipment Step pivot transfers: Max assist, +2 physical assistance (no initiation of moving R leg) Transfer via Lift Equipment: Stedy General transfer comment: sit to stand from bed with minA with use of Stedy. patient with flicker of quad activation in standing. Benefits from simple 1 step commands. Able to perform mini squats x 10 in [place]     Ambulation / Gait / Stairs / Wheelchair Mobility   Ambulation/Gait General Gait Details: unable to perform safely at this time     Posture / Balance Dynamic Sitting Balance Sitting balance - Comments: R lateral in standing. IMproved with use of bar on Stedy Balance Overall balance assessment: Needs assistance Sitting-balance support: Feet supported, Single extremity supported Sitting balance-Leahy Scale: Poor Sitting balance - Comments: R lateral  in standing. IMproved with use of bar on Stedy Standing balance support: Bilateral upper extremity supported, During functional activity Standing balance-Leahy Scale: Poor Standing balance comment: reliant on UE support and external assist     Special needs/care consideration Special service needs none     Previous Home Environment (from acute therapy documentation) Living Arrangements: Spouse/significant other, Children  Lives With: Spouse, Son Available Help at Discharge: Available PRN/intermittently, Family Type of Home: House Home Layout: One level Home Access: Stairs to enter [employer] Shower/Tub: Holiday representative Accessibility: Yes Home Care Services: No Additional Comments: need to confirm info due to pt's cognition and aphasia   Discharge Living Setting Plans for Discharge Living Setting: Patient's home Type of Home at Discharge: House Discharge Home Layout: One level Discharge Home Access: Stairs to enter Entrance Stairs-Rails: None Entrance Stairs-Number of Steps: 2 Discharge Bathroom Toilet: Standard Discharge Bathroom Accessibility: No Does the patient have any problems obtaining your medications?: No   Social/Family/Support Systems Patient Roles:  Spouse Contact Information: [phone] Anticipated Caregiver: Wannetta Sender (wife) Anticipated Caregiver's Contact Information: 24/7 Ability/Limitations of Caregiver: Min A Caregiver Availability: 24/7 Discharge Plan Discussed with Primary Caregiver: Yes Is Caregiver In Agreement with Plan?: Yes Does Caregiver/Family have Issues with Lodging/Transportation while Pt is in Rehab?: No   Goals Patient/Family Goal for Rehab: PT/OT/SLP Mod I Expected length of stay: 7-10 days Pt/Family Agrees to Admission and willing to participate: Yes Program Orientation Provided & Reviewed with Pt/Caregiver Including Roles  & Responsibilities: Yes   Decrease burden of Care through IP rehab admission: Specialzed equipment needs,  Decrease number of caregivers, Bowel and bladder program, and Patient/family education   Possible need for SNF placement upon discharge: not anticipated    Patient Condition: I have reviewed medical records from [facility], spoken with CM, and patient and spouse. I met with patient at the bedside for inpatient rehabilitation assessment.  Patient will benefit from ongoing PT, OT, and SLP, can actively participate in 3 hours of therapy a day 5 days of the week, and can make measurable gains during the admission.  Patient will also benefit from the coordinated team approach during an Inpatient Acute Rehabilitation admission.  The patient will receive intensive therapy as well as Rehabilitation physician, nursing, social worker, and care management interventions.  Due to safety, skin/wound care, disease management, medication administration, pain management, and patient education the patient requires 24 hour a day rehabilitation nursing.  The patient is currently min A-min G with mobility and basic ADLs.  Discharge setting and therapy post discharge at home with home health is anticipated.  Patient has agreed to participate in the Acute Inpatient Rehabilitation Program and will admit today.   Preadmission Screen Completed By:  Genella Mech, 06/17/2021 3:34 PM ______________________________________________________________________   Discussed status with Dr. Naaman Plummer on 06/19/21 at 57 and received approval for admission today.   Admission Coordinator:  Genella Mech, CCC-SLP, time 1000/Date 06/19/21    Assessment/Plan: Diagnosis: ICH Does the need for close, 24 hr/day Medical supervision in concert with the patient's rehab needs make it unreasonable for this patient to be served in a less intensive setting? Yes Co-Morbidities requiring supervision/potential complications: HTN, Due to bladder management, bowel management, safety, skin/wound care, disease management, medication  administration, pain management, and patient education, does the patient require 24 hr/day rehab nursing? Yes Does the patient require coordinated care of a physician, rehab nurse, PT, OT, and SLP to address physical and functional deficits in the context of the above medical diagnosis(es)? Yes Addressing deficits in the following areas: balance, endurance, locomotion, strength, transferring, bowel/bladder control, bathing, dressing, feeding, grooming, toileting, cognition, speech, swallowing, and psychosocial support Can the patient actively participate in an intensive therapy program of at least 3 hrs of therapy 5 days a week? Yes The potential for patient to make measurable gains while on inpatient rehab is excellent Anticipated functional outcomes upon discharge from inpatient rehab: modified independent PT, modified independent OT, modified independent SLP Estimated rehab length of stay to reach the above functional goals is: 7-10 days Anticipated discharge destination: Home 10. Overall Rehab/Functional Prognosis: excellent     MD Signature: Meredith Staggers, MD, [place] Director Rehabilitation Services 06/19/2021

## 2021-06-19 NOTE — H&P (Signed)
Physical Medicine and Rehabilitation Admission H&P        Chief Complaint  Patient presents with   Code Stroke  : HPI: Wayne Mendez is a 55 year old right-handed male with history of hypertension.  Per chart review patient lives with spouse.  1 level home.  Independent prior to admission.  Presented 06/15/2021 with acute onset of right side weakness and slurred speech.  Blood pressure 240/110.  CT/MRI shows intraparenchymal hemorrhage centered in the left basal ganglia extending into the left frontal and temporal lobes with mild surrounding edema and local mass-effect causing approximate 3 mm left-to-right midline shift.  Echocardiogram ejection fraction 65 to 70% no wall motion abnormalities.  Admission chemistries unremarkable glucose 123, alcohol negative, urine drug screen negative.  Neurology service follow-up conservative care close monitoring of blood pressure initially maintained on Cleviprex.  He was cleared to begin subcutaneous heparin for DVT prophylaxis 06/17/2021.  Tolerating a regular diet.  Therapy evaluations completed due to patient's right side weakness and slurred speech was admitted for a comprehensive rehab program.   Review of Systems  Constitutional:  Negative for chills and fever.  HENT:  Negative for hearing loss.   Eyes:  Negative for blurred vision and double vision.  Respiratory:  Negative for cough and shortness of breath.   Cardiovascular:  Negative for chest pain, palpitations and leg swelling.  Gastrointestinal:  Positive for constipation. Negative for heartburn, nausea and vomiting.  Genitourinary:  Negative for dysuria, flank pain and hematuria.  Musculoskeletal:  Positive for myalgias.  Skin:  Negative for rash.  Neurological:  Positive for speech change and weakness.  All other systems reviewed and are negative.     Past Medical History:  Diagnosis Date   Hypertension           Past Surgical History:  Procedure Laterality Date   TYMPANOSTOMY  TUBE PLACEMENT        History reviewed. No pertinent family history. Social History:  reports that he has never smoked. He has never used smokeless tobacco. He reports current alcohol use. He reports that he does not currently use drugs. Allergies: No Known Allergies       Medications Prior to Admission  Medication Sig Dispense Refill   ibuprofen (ADVIL) 200 MG tablet Take 400 mg by mouth every 6 (six) hours as needed for fever, headache or mild pain.       pseudoephedrine (SUDAFED) 120 MG 12 hr tablet Take 120 mg by mouth 2 (two) times daily as needed for congestion.          Drug Regimen Review Drug regimen was reviewed and remains appropriate with no significant issues identified.   Home: Home Living Family/patient expects to be discharged to:: Private residence Living Arrangements: Spouse/significant other, Children Available Help at Discharge: Available PRN/intermittently, Family Type of Home: House Home Access: Stairs to enter Home Layout: One level Bathroom Shower/Tub: Holiday representative Accessibility: Yes Home Equipment: None Additional Comments: need to confirm info due to pt's cognition and aphasia  Lives With: Spouse, Son   Functional History: Prior Function Prior Level of Function : Working/employed, Driving, Independent/Modified Independent   Functional Status:  Mobility: Bed Mobility Overal bed mobility: Needs Assistance Bed Mobility: Supine to Sit Supine to sit: Min assist General bed mobility comments: Pt impulsive and moving quickly.  Kept trying to sit and move R LE with L UE but kept tipping over to R side.  REquired max multimodal cues and assist to stop and perform controlled movements  including moving R LE over before trying to sit in order to maintain balance Transfers Overall transfer level: Needs assistance Equipment used: Ambulation equipment used Transfers: Sit to/from Stand Sit to Stand: Min guard Bed to/from chair/wheelchair/BSC transfer  type:: Via Lift equipment Step pivot transfers: Max assist, +2 physical assistance (no initiation of moving R leg) Transfer via Lift Equipment: Stedy General transfer comment: Pt performed at least 6 sit to stands during session from bed or STEDY.  Only required min guard but heavy dependence on L leg and UE. Stedy for pivot. Ambulation/Gait General Gait Details: unable to perform safely at this time Pre-gait activities: Majority of session focused on pre -gait/standing activities in STEDY.  Performed 3x10 mini-squats and 3x10 weight shifting with frequent cues for controlled motion, therapist providing support at R knee with facilitation to activated quads/straighten knee, and heavy reliance on L side.  On last bout of weight shifting, improved control but still no noted active quad contraction.  1x10 lifting R heel with mod A - slight assist from hip flex   ADL: ADL Overall ADL's : Needs assistance/impaired Eating/Feeding: Minimal assistance, Sitting Eating/Feeding Details (indicate cue type and reason): dys 2; honey thick liquids Grooming: Moderate assistance Upper Body Bathing: Moderate assistance Lower Body Bathing: Moderate assistance, Bed level, Sitting/lateral leans Upper Body Dressing : Moderate assistance Lower Body Dressing: Maximal assistance Toilet Transfer: +2 for physical assistance, Minimal assistance Toilet Transfer Details (indicate cue type and reason): attempting with stedy simulated Toileting - Clothing Manipulation Details (indicate cue type and reason): using urinal; max A Functional mobility during ADLs: Maximal assistance, +2 for physical assistance   Cognition: Cognition Overall Cognitive Status: Impaired/Different from baseline Arousal/Alertness: Awake/alert Orientation Level: Oriented X4 Attention: Focused, Sustained Focused Attention: Appears intact Sustained Attention: Appears intact Awareness: Impaired Awareness Impairment: Intellectual impairment,  Emergent impairment Problem Solving: Impaired Problem Solving Impairment: Functional basic, Verbal basic Safety/Judgment: Impaired Cognition Arousal/Alertness: Awake/alert Behavior During Therapy: Impulsive Overall Cognitive Status: Impaired/Different from baseline Area of Impairment: Attention, Safety/judgement, Awareness Current Attention Level: Selective Safety/Judgement: Decreased awareness of safety, Decreased awareness of deficits Awareness: Emergent General Comments: Pt very impulsive requiring frequent cues to slow down, listen, and for controlled movements. Difficult to assess due to: Impaired communication   Physical Exam: Blood pressure (!) 151/96, pulse 73, temperature (!) 97.5 F (36.4 C), temperature source Oral, resp. rate 16, height 6' (1.829 m), weight 96.8 kg, SpO2 100 %. Physical Exam Constitutional:      Appearance: Normal appearance.  HENT:     Head: Normocephalic and atraumatic.     Right Ear: External ear normal.     Left Ear: External ear normal.     Nose: Nose normal.     Mouth/Throat:     Mouth: Mucous membranes are moist.  Eyes:     Conjunctiva/sclera: Conjunctivae normal.  Cardiovascular:     Rate and Rhythm: Normal rate and regular rhythm.     Heart sounds: No murmur heard.   No gallop.  Pulmonary:     Effort: Pulmonary effort is normal. No respiratory distress.     Breath sounds: No wheezing or rales.  Abdominal:     General: There is no distension.     Palpations: Abdomen is soft.     Tenderness: There is no abdominal tenderness.  Musculoskeletal:        General: No swelling or tenderness. Normal range of motion.     Cervical back: Normal range of motion.  Skin:    General: Skin is warm  and dry.  Neurological:     Mental Status: He is alert.     Comments: Alert. Expressive language deficits, non-fluent.  Some receptive deficits as well, likely apraxic, too. Able to tell me where he is with verbal cueing and provide some basic biographical  information. Right central 7 and tongue deviation. Right arm and leg 1/2 sensation for LT and PP. RUE trace deltoid but otherwise 0/5.  RLE 1/5 HE, HAD, trace to absent distally. DTR's trace. No resting tone. LUE and LLE 5/5 with normal sensation  Psychiatric:        Mood and Affect: Mood normal.        Behavior: Behavior normal.      Lab Results Last 48 Hours        Results for orders placed or performed during the hospital encounter of 06/15/21 (from the past 48 hour(s))  CBC     Status: Abnormal    Collection Time: 06/18/21  3:00 AM  Result Value Ref Range    WBC 13.0 (H) 4.0 - 10.5 K/uL    RBC 5.01 4.22 - 5.81 MIL/uL    Hemoglobin 14.6 13.0 - 17.0 g/dL    HCT 44.2 39.0 - 52.0 %    MCV 88.2 80.0 - 100.0 fL    MCH 29.1 26.0 - 34.0 pg    MCHC 33.0 30.0 - 36.0 g/dL    RDW 13.6 11.5 - 15.5 %    Platelets 160 150 - 400 K/uL    nRBC 0.0 0.0 - 0.2 %      Comment: Performed at [facility], [place] [address], [place], [place] [id number]  Basic metabolic panel     Status: Abnormal    Collection Time: 06/18/21  3:00 AM  Result Value Ref Range    Sodium 140 135 - 145 mmol/L    Potassium 3.2 (L) 3.5 - 5.1 mmol/L    Chloride 106 98 - 111 mmol/L    CO2 24 22 - 32 mmol/L    Glucose, Bld 100 (H) 70 - 99 mg/dL      Comment: Glucose reference range applies only to samples taken after fasting for at least 8 hours.    BUN 15 6 - 20 mg/dL    Creatinine, Ser 1.14 0.61 - 1.24 mg/dL    Calcium 8.5 (L) 8.9 - 10.3 mg/dL    GFR, Estimated >60 >60 mL/min      Comment: (NOTE) Calculated using the CKD-EPI Creatinine Equation (2021)      Anion gap 10 5 - 15      Comment: Performed at [facility] [address] [place], [place] [address]  CBC     Status: Abnormal    Collection Time: 06/19/21  2:37 AM  Result Value Ref Range    WBC 11.8 (H) 4.0 - 10.5 K/uL    RBC 5.11 4.22 - 5.81 MIL/uL    Hemoglobin 14.7 13.0 - 17.0 g/dL    HCT 45.2 39.0 - 52.0 %    MCV 88.5 80.0 - 100.0 fL    MCH 28.8  26.0 - 34.0 pg    MCHC 32.5 30.0 - 36.0 g/dL    RDW 13.2 11.5 - 15.5 %    Platelets 174 150 - 400 K/uL    nRBC 0.0 0.0 - 0.2 %      Comment: Performed at [facility], [place] [address] [place], [place] [id number]  Basic metabolic panel     Status: Abnormal  Collection Time: 06/19/21  2:37 AM  Result Value Ref Range    Sodium 138 135 - 145 mmol/L    Potassium 3.6 3.5 - 5.1 mmol/L    Chloride 106 98 - 111 mmol/L    CO2 26 22 - 32 mmol/L    Glucose, Bld 95 70 - 99 mg/dL      Comment: Glucose reference range applies only to samples taken after fasting for at least 8 hours.    BUN 19 6 - 20 mg/dL    Creatinine, Ser 1.27 (H) 0.61 - 1.24 mg/dL    Calcium 8.7 (L) 8.9 - 10.3 mg/dL    GFR, Estimated >60 >60 mL/min      Comment: (NOTE) Calculated using the CKD-EPI Creatinine Equation (2021)      Anion gap 6 5 - 15      Comment: Performed at [facility] [address], [place], [place] [address]       Imaging Results (Last 48 hours)  DG Swallowing Func-Speech Pathology   Result Date: 06/17/2021 Table formatting from the original result was not included. Objective Swallowing Evaluation: Type of Study: MBS-Modified Barium Swallow Study  Patient Details Name: Andrell Sutley MRN: [id number] Date of Birth: 12/21/1965 Today's Date: 06/17/2021 Time: SLP Start Time (ACUTE ONLY): 0830 -SLP Stop Time (ACUTE ONLY): 0843 SLP Time Calculation (min) (ACUTE ONLY): 13 min Past Medical History: Past Medical History: Diagnosis Date  Hypertension  Past Surgical History: Past Surgical History: Procedure Laterality Date  TYMPANOSTOMY TUBE PLACEMENT   HPI: Olvin Leveck is a 55 y.o. male PMHx HTN (untreated) with acute left basal ganglia intraparenchymal hemorrhage.  No data recorded  Recommendations for follow up therapy are one component of a multi-disciplinary discharge planning process, led by the attending physician.  Recommendations may be updated based on patient status, additional functional criteria  and insurance authorization. Assessment / Plan / Recommendation Clinical Impressions 06/17/2021 Clinical Impression Pt demosntrates mild sensory impairment leading to delayed swallow initaition with all trials, despite this, even with mixed consistency and pressure to drink consecutively, at worst, pt only had trace flash penetration with thin liquids. No residuals. Pt able to attempt regualr solids and thin liquids. May need reminders for lingual sweep/oral care after meals given sensory changes on the right. Suspect pt may have intermittent cough with intake if he is drinking very quickly.  Will f/u for tolerance. SLP Visit Diagnosis Dysphagia, oropharyngeal phase (R13.12) Attention and concentration deficit following -- Frontal lobe and executive function deficit following -- Impact on safety and function Mild aspiration risk   Treatment Recommendations 06/17/2021 Treatment Recommendations Therapy as outlined in treatment plan below   Prognosis 06/17/2021 Prognosis for Safe Diet Advancement Good Barriers to Reach Goals Language deficits;Cognitive deficits Barriers/Prognosis Comment -- Diet Recommendations 06/17/2021 SLP Diet Recommendations Regular solids;Thin liquid Liquid Administration via Cup;Straw Medication Administration Whole meds with liquid Compensations Slow rate;Small sips/bites Postural Changes Seated upright at 90 degrees   Other Recommendations 06/17/2021 Recommended Consults -- Oral Care Recommendations Oral care before and after PO Other Recommendations Have oral suction available Follow Up Recommendations Acute inpatient rehab (3hours/day) Assistance recommended at discharge Frequent or constant Supervision/Assistance Functional Status Assessment Patient has had a recent decline in their functional status and demonstrates the ability to make significant improvements in function in a reasonable and predictable amount of time. Frequency and Duration  06/17/2021 Speech Therapy Frequency (ACUTE  ONLY) min 2x/week Treatment Duration 2 weeks   Oral Phase 06/17/2021 Oral Phase Impaired Oral - Pudding  Teaspoon -- Oral - Pudding Cup -- Oral - Honey Teaspoon -- Oral - Honey Cup -- Oral - Nectar Teaspoon WFL Oral - Nectar Cup Right anterior bolus loss Oral - Nectar Straw Right anterior bolus loss Oral - Thin Teaspoon -- Oral - Thin Cup Right anterior bolus loss Oral - Thin Straw Right anterior bolus loss Oral - Puree WFL Oral - Mech Soft -- Oral - Regular WFL Oral - Multi-Consistency -- Oral - Pill -- Oral Phase - Comment --  Pharyngeal Phase 06/17/2021 Pharyngeal Phase Impaired Pharyngeal- Pudding Teaspoon -- Pharyngeal -- Pharyngeal- Pudding Cup -- Pharyngeal -- Pharyngeal- Honey Teaspoon -- Pharyngeal -- Pharyngeal- Honey Cup -- Pharyngeal -- Pharyngeal- Nectar Teaspoon Delayed swallow initiation-vallecula Pharyngeal -- Pharyngeal- Nectar Cup Delayed swallow initiation-pyriform sinuses Pharyngeal -- Pharyngeal- Nectar Straw Delayed swallow initiation-pyriform sinuses Pharyngeal -- Pharyngeal- Thin Teaspoon -- Pharyngeal -- Pharyngeal- Thin Cup Delayed swallow initiation-pyriform sinuses;Penetration/Aspiration before swallow Pharyngeal Material enters airway, remains ABOVE vocal cords then ejected out;Material does not enter airway Pharyngeal- Thin Straw Delayed swallow initiation-pyriform sinuses;Penetration/Aspiration before swallow Pharyngeal Material enters airway, remains ABOVE vocal cords then ejected out;Material does not enter airway Pharyngeal- Puree Delayed swallow initiation-vallecula Pharyngeal -- Pharyngeal- Mechanical Soft -- Pharyngeal -- Pharyngeal- Regular Delayed swallow initiation-vallecula Pharyngeal -- Pharyngeal- Multi-consistency -- Pharyngeal -- Pharyngeal- Pill -- Pharyngeal -- Pharyngeal Comment --  No flowsheet data found. DeBlois, [name] 06/17/2021, 10:38 AM                               Medical Problem List and Plan: 1. Functional deficits right side weakness and  aphasia secondary to left basal ganglia and external capsule ICH due to uncontrolled hypertension             -patient may shower             -ELOS/Goals: 13-20 days, mod I to set up with mobility and self-care and mod I to supervision with language             -PRAFO/WHO for right side 2.  Antithrombotics: -DVT/anticoagulation:  Pharmaceutical: Heparin             -antiplatelet therapy: N/A 3. Pain Management: Tylenol as needed 4. Mood: Provide emotional support             -antipsychotic agents: N/A 5. Neuropsych: This patient is capable of making decisions on his own behalf. 6. Skin/Wound Care: Routine skin checks 7. Fluids/Electrolytes/Nutrition: Routine in and outs with follow-up chemistries 8.  Hypertension.  Norvasc 10 mg daily, lisinopril 20 mg twice daily, Lopressor 75 mg twice daily.               12/30 poorly controlled at present 9.  Hyperlipidemia.  Hold due to IPH.  -Consider low dose statin at discharge         Charlton Amor, PA-C 06/19/2021  I have personally performed a face to face diagnostic evaluation of this patient and formulated the key components of the plan.  Additionally, I have personally reviewed laboratory data, imaging studies, as well as relevant notes and concur with the physician assistant's documentation above.  The patient's status has not changed from the original H&P.  Any changes in documentation from the acute care chart have been noted above.  Ranelle Oyster, MD, [place]

## 2021-06-19 NOTE — Progress Notes (Incomplete)
Inpatient Rehabilitation Admission Medication Review by a Pharmacist  A complete drug regimen review was completed for this patient to identify any potential clinically significant medication issues.  High Risk Drug Classes Is patient taking? Indication by Medication  Antipsychotic No   Anticoagulant Yes Heparin- VTE prophylaxis  Antibiotic No   Opioid No   Antiplatelet No   Hypoglycemics/insulin No   Vasoactive Medication Yes Lopressor, amlodipine, lisinopril- hypertension  Chemotherapy No   Other Yes Protonix- GERD     Type of Medication Issue Identified Description of Issue Recommendation(s)  Drug Interaction(s) (clinically significant)     Duplicate Therapy     Allergy     No Medication Administration End Date     Incorrect Dose     Additional Drug Therapy Needed     Significant med changes from prior encounter (inform family/care partners about these prior to discharge).    Other       Clinically significant medication issues were identified that warrant physician communication and completion of prescribed/recommended actions by midnight of the next day:  No  Name of provider notified for urgent issues identified:   Provider Method of Notification:     Pharmacist comments:   Time spent performing this drug regimen review (minutes):  30   Brianni Manthe BS, PharmD, BCPS Clinical Pharmacist 06/19/2021 2:45 PM

## 2021-06-19 NOTE — Plan of Care (Signed)
°  Problem: Education: Goal: Knowledge of disease or condition will improve Outcome: Adequate for Discharge Goal: Knowledge of secondary prevention will improve (SELECT ALL) Outcome: Adequate for Discharge Goal: Knowledge of patient specific risk factors will improve (INDIVIDUALIZE FOR PATIENT) Outcome: Adequate for Discharge Goal: Individualized Educational Video(s) Outcome: Adequate for Discharge   Problem: Health Behavior/Discharge Planning: Goal: Ability to manage health-related needs will improve Outcome: Adequate for Discharge   Problem: Self-Care: Goal: Ability to participate in self-care as condition permits will improve Outcome: Adequate for Discharge Goal: Ability to communicate needs accurately will improve Outcome: Adequate for Discharge   Problem: Nutrition: Goal: Risk of aspiration will decrease Outcome: Adequate for Discharge   Problem: Intracerebral Hemorrhage Tissue Perfusion: Goal: Complications of Intracerebral Hemorrhage will be minimized Outcome: Adequate for Discharge

## 2021-06-19 NOTE — Progress Notes (Addendum)
Physical Therapy Treatment Patient Details Name: Wayne Mendez MRN: [id number] DOB: December 04, 1965 Today's Date: 06/19/2021   History of Present Illness Pt is 55 yo male who developed sudden R sided weakness and slurred speech walking into the gym on 06/15/21. 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  PMH: HTN.    PT Comments    Pt received in recliner, pleasantly agreeable to therapy session and with good participation and tolerance for reciprocal transfer training, standing exercises and pre-gait tasks with RW and Stedy. Co-treatment with OT due to pt impulsivity, need for +2 physical assist and multidisciplinary therapy needs. Pt making good progress with standing tolerance, able to initiate transfers and pre-gait tasks in RW but unsafe to progress gait with RW due to RLE buckling. He may be able to progress steps more with knee immobilizer or lockable knee brace and AFO. He had trace to no quad contraction on RLE noted with reclined and standing RLE exercises with tactile input, pt performs slight Rt hip flexion with great effort. Pt continues to benefit from PT services to progress toward functional mobility goals.    Recommendations for follow up therapy are one component of a multi-disciplinary discharge planning process, led by the attending physician.  Recommendations may be updated based on patient status, additional functional criteria and insurance authorization.  Follow Up Recommendations  Acute inpatient rehab (3hours/day)     Assistance Recommended at Discharge Frequent or constant Supervision/Assistance  Equipment Recommendations  Other (comment) (TBD post-acute)    Recommendations for Other Services Rehab consult     Precautions / Restrictions Precautions Precautions: Fall Precaution Comments: SBP <160 Restrictions Weight Bearing Restrictions: No     Mobility  Bed Mobility               General bed mobility comments: Pt up in recliner on  therapy entry/exit    Transfers Overall transfer level: Needs assistance Equipment used: Rolling walker (2 wheels);Ambulation equipment used Transfers: Sit to/from Stand Sit to Stand: Mod assist;+2 physical assistance;+2 safety/equipment;Min guard           General transfer comment: Pt initially stood with RW, requiring min/modA to power up to standing with Rt knee blocked, up to modA +2 for steadying in stance. Pt then stood x4 in the [place], requiring only min guard to power up and steady himself Transfer via Lift Equipment: Stedy  Ambulation/Gait             Pre-gait activities: standing weight shift in Stedy x10 reps BLE; Sidestep x1 toward Rt side with RW but RLE buckling after step so deferred more sidesteps     Stairs             Wheelchair Mobility    Modified Rankin (Stroke Patients Only) Modified Rankin (Stroke Patients Only) Pre-Morbid Rankin Score: No symptoms Modified Rankin: Severe disability     Balance Overall balance assessment: Needs assistance Sitting-balance support: Feet supported Sitting balance-Leahy Scale: Fair     Standing balance support: Bilateral upper extremity supported;During functional activity Standing balance-Leahy Scale: Poor Standing balance comment: Pt working on weight shifting in standing, requring mod-max support both with RW and stedy (increased assist with RW)                            Cognition Arousal/Alertness: Awake/alert Behavior During Therapy: Impulsive Overall Cognitive Status: Impaired/Different from baseline Area of Impairment: Attention;Safety/judgement;Awareness  Current Attention Level: Selective     Safety/Judgement: Decreased awareness of safety;Decreased awareness of deficits Awareness: Emergent   General Comments: Pt very impulsive requiring frequent cues to slow down, listen, and for controlled movements. Does better today with initial instruction about  therapists setting room up prior to mobilizing and for pt to wait/perform seated therex prior to standing.        Exercises Other Exercises Other Exercises: standing BLE AROM: heel raises, mini squats x10 reps in [place] Other Exercises: Weight shifting in standing x5 reps in RW and x10 reps in [place] Other Exercises: STS x 5 reps for strengthening Other Exercises: seated shoulder shrugs x10 reps    General Comments General comments (skin integrity, edema, etc.): VSS on RA, HR 85-86 bpm and no acute s/sx distress or dizziness      Pertinent Vitals/Pain Pain Assessment: No/denies pain    Home Living                          Prior Function            PT Goals (current goals can now be found in the care plan section) Acute Rehab PT Goals Patient Stated Goal: return of independence PT Goal Formulation: With patient Time For Goal Achievement: 06/30/21 Progress towards PT goals: Progressing toward goals    Frequency    Min 4X/week      PT Plan Current plan remains appropriate    Co-evaluation PT/OT/SLP Co-Evaluation/Treatment: Yes Reason for Co-Treatment: Necessary to address cognition/behavior during functional activity;For patient/therapist safety;To address functional/ADL transfers PT goals addressed during session: Mobility/safety with mobility;Balance;Proper use of DME;Strengthening/ROM OT goals addressed during session: ADL's and self-care      AM-PAC PT "6 Clicks" Mobility   Outcome Measure  Help needed turning from your back to your side while in a flat bed without using bedrails?: A Little Help needed moving from lying on your back to sitting on the side of a flat bed without using bedrails?: A Little Help needed moving to and from a bed to a chair (including a wheelchair)?: A Lot (anticipate via seated scoot) Help needed standing up from a chair using your arms (e.g., wheelchair or bedside chair)?: A Lot (needs Stedy or +2 modA) Help needed to walk  in hospital room?: Total Help needed climbing 3-5 steps with a railing? : Total 6 Click Score: 12    End of Session Equipment Utilized During Treatment: Gait belt Activity Tolerance: Patient tolerated treatment well Patient left: in chair;with call bell/phone within reach;with chair alarm set;with nursing/sitter in room (rehab MD entering his room to speak with pt) Nurse Communication: Mobility status PT Visit Diagnosis: Unsteadiness on feet (R26.81);Hemiplegia and hemiparesis;Difficulty in walking, not elsewhere classified (R26.2) Hemiplegia - Right/Left: Right Hemiplegia - dominant/non-dominant: Non-dominant Hemiplegia - caused by: Nontraumatic intracerebral hemorrhage     Time: 4196-2229 PT Time Calculation (min) (ACUTE ONLY): 32 min  Charges:  $Therapeutic Exercise: 8-22 mins                     Krisy Dix P., PTA Acute Rehabilitation Services Pager: [phone] Office: [phone]    Angus Palms 06/19/2021, 1:59 PM

## 2021-06-19 NOTE — TOC Transition Note (Signed)
Transition of Care [facility]) - CM/SW Discharge Note   Patient Details  Name: Wayne Mendez MRN: [id number] Date of Birth: Apr 17, 1966  [facility] CM/SW Contact:  Kermit Balo, RN Phone Number: 06/19/2021, 12:58 PM   Clinical Narrative:    Patient is discharging to CIR today. CM signing off.    Final next level of care: IP Rehab Facility Barriers to Discharge: No Barriers Identified   Patient Goals and CMS Choice     Choice offered to / list presented to : Patient  Discharge Placement                       Discharge Plan and Services                                     Social Determinants of Health (SDOH) Interventions     Readmission Risk Interventions No flowsheet data found.

## 2021-06-19 NOTE — Progress Notes (Signed)
°   06/19/21 0830  What Happened  Was fall witnessed? Yes  Who witnessed fall? Visitor  Patients activity before fall bathroom-unassisted  Point of contact arm/shoulder  Was patient injured? No  Follow Up  MD notified Pamella Pert  Time MD notified 0900  Family notified Yes - comment  Time family notified 0830  Additional tests No  Simple treatment Other (comment) (n/a)  Progress note created (see row info) Yes  Adult Fall Risk Assessment  Risk Factor Category (scoring not indicated) High fall risk per protocol (document High fall risk)  Patient Fall Risk Level High fall risk  Adult Fall Risk Interventions  Required Bundle Interventions *See Row Information* High fall risk - low, moderate, and high requirements implemented  Additional Interventions Use of appropriate toileting equipment (bedpan, BSC, etc.)  Screening for Fall Injury Risk (To be completed on HIGH fall risk patients) - Assessing Need for Floor Mats  Risk For Fall Injury- Criteria for Floor Mats Noncompliant with safety precautions  Will Implement Floor Mats Yes  Vitals  Temp 97.7 F (36.5 C)  Temp Source Oral  BP (!) 157/98  MAP (mmHg) 113  BP Location Left Arm  BP Method Automatic  Patient Position (if appropriate) Sitting  Pulse Rate 87  Pulse Rate Source [place]  Cardiac Rhythm NSR  Resp 16  Oxygen Therapy  SpO2 98 %  O2 Device Room Air  Patient Activity (if Appropriate) In chair  Pain Assessment  Pain Scale 0-10  Pain Score 0  Neurological  Neuro (WDL) X  Level of Consciousness Alert  Orientation Level Oriented X4  Cognition Impulsive;Follows commands  Speech Slurred/Dysarthria;Expressive aphasia  Pupil Assessment  Yes  R Pupil Size (mm) 3  R Pupil Shape Round  R Pupil Reaction Brisk  L Pupil Size (mm) 3  L Pupil Shape Round  L Pupil Reaction Brisk  Additional Pupil Assessments No  Motor Function/Sensation Assessment Grip;Motor response  Facial Symmetry Asymmetrical right  R Hand Grip  Absent  L Hand Grip Present;Strong  RUE Motor Response No movement to painful stimulus  LUE Motor Response Purposeful movement  RLE Motor Response Purposeful movement  LLE Motor Response Purposeful movement  Neuro Symptoms Forgetful  Glasgow Coma Scale  Eye Opening 4  Best Verbal Response (NON-intubated) 5  Best Motor Response 6  Glasgow Coma Scale Score 15  Musculoskeletal  Musculoskeletal (WDL) X  Assistive Device Stedy  Generalized Weakness Yes  Weight Bearing Restrictions No  Musculoskeletal Details  RUE Paralysis  RLE Weakness  Integumentary  Integumentary (WDL) WDL   Patient was assisted to the bathroom with the stedy lift.  RN informed patient to not get up without assistance.  RN stepped outside door to get assist getting patient up and clean.  In that brief moment patient stated he leaned forward for toilet paper and slid off commode.  Visitor in room at time of fall visitor and patient stated he did not hit his head.  Patient assisted to recliner chair.  Assessment completed with no changes from previous.  No complaints of pain or discomfort.  Chair alarm placed and on, floor mats in place.  MD was paged and made aware no new orders received.

## 2021-06-19 NOTE — Discharge Summary (Signed)
Physician Discharge Summary  Wayne Mendez TSV:[id number] DOB: 1965/09/17 DOA: 06/15/2021  PCP: Pcp, No  Admit date: 06/15/2021 Discharge date: 06/19/2021  Admitted From: home Disposition:  CIR  Discharge Condition: stable CODE STATUS: Full code Diet recommendation: heart healthy  HPI: Per admitting MD, Wayne Mendez is a 55 y.o. male PMHx HTN not taking medications was walking into the gym and suddenly developed right sided weakness and slurred speech.  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  Follow-up imaging with MRI showed stability into his basal ganglia ICH.  Underwent stroke work-up, 2D echo showed an EF of 65-70%.  LDL was 103.  A1c 5.7.  Will be discharged to inpatient rehab   Active Problems Hypertensive emergency-no meds at home, nonadherent to antihypertensive regimen.  Currently on lisinopril, metoprolol, amlodipine with improvement in his blood pressure.  Continue.  Blood pressure goal less than 160, continue to monitor and adjust medications in rehab Hyperlipidemia-low dose statin on dc per neurology  Hypokalemia-repleted Leukocytosis-likely reactive  Sepsis ruled out   Discharge Instructions  Discharge Instructions     Ambulatory referral to Neurology   Complete by: As directed    Follow up with stroke clinic NP (Wayne Mendez or Wayne Mendez, if both not available, consider Wayne Mendez, or Wayne Mendez) at [employer] in about 4 weeks. Thanks.      Allergies as of 06/19/2021   No Known Allergies      Medication List     STOP taking these medications    ibuprofen 200 MG tablet Commonly known as: ADVIL   pseudoephedrine 120 MG 12 hr tablet Commonly known as: SUDAFED       TAKE these medications    amLODipine 10 MG tablet Commonly known as: NORVASC Take 1 tablet (10 mg total) by mouth daily. Start taking on: June 20, 2021   atorvastatin 10 MG  tablet Commonly known as: Lipitor Take 1 tablet (10 mg total) by mouth daily.   lisinopril 20 MG tablet Commonly known as: ZESTRIL Take 1 tablet (20 mg total) by mouth 2 (two) times daily.   Metoprolol Tartrate 75 MG Tabs Take 75 mg by mouth 2 (two) times daily.        Follow-up Information     [facility]. Schedule an appointment as soon as possible for a visit in 1 month(s).   Specialty: Neurology Why: stroke clinic Contact information: [address] [place] [address] [phone]                Consultations: Neurology   Procedures/Studies:  CT HEAD WO CONTRAST  Result Date: 06/16/2021 CLINICAL DATA:  Follow-up stroke EXAM: CT HEAD WITHOUT CONTRAST TECHNIQUE: Contiguous axial images were obtained from the base of the skull through the vertex without intravenous contrast. COMPARISON:  CT from the previous day. FINDINGS: Brain: There are changes consistent with parenchymal hemorrhage in the left parietal lobe deep white matter and extending into the basal ganglia and superiorly into the corona radiata on the left. The hematoma measures approximately 4 cm in greatest AP dimension. It measures approximately 3.1 cm in transverse dimension although evaluation is somewhat difficult due to the irregular shape of hematoma. It extends approximately 3.4 cm in the cranial caudad projection. No other focal areas of hemorrhage are seen. Mild mass effect is noted with minimal midline shift of approximately 3 mm. No other focal abnormality is noted. Vascular: No hyperdense vessel or unexpected calcification. Skull: Normal.  Negative for fracture or focal lesion. Sinuses/Orbits: Paranasal sinuses demonstrate mucosal thickening in the ethmoid and frontal sinuses. Air-fluid level is noted in the right maxillary antrum. Other: None. IMPRESSION: Parenchymal hemorrhage in the left parietal lobe and extending into the left basal ganglia and corona  radiata. Mild midline shift is noted from local mass effect. The overall appearance is stable from prior examination obtained under the name Wayne Mendez earlier on 06/15/2021 Air-fluid level is again seen in the right maxillary antrum stable from the prior exam. These results will be called to the ordering clinician or representative by the Radiologist Assistant, and communication documented in the PACS or [employer]. Electronically Signed   By: Alcide Clever M.D.   On: 06/16/2021 00:51   MR BRAIN W WO CONTRAST  Result Date: 06/16/2021 CLINICAL DATA:  Hemorrhagic stroke, right-sided weakness, slurred speech EXAM: MRI HEAD WITHOUT AND WITH CONTRAST TECHNIQUE: Multiplanar, multiecho pulse sequences of the brain and surrounding structures were obtained without and with intravenous contrast. CONTRAST:  10mL GADAVIST GADOBUTROL 1 MMOL/ML IV SOLN COMPARISON:  No prior MRI, correlation is made with 06/16/2021 CT FINDINGS: Evaluation is somewhat limited by motion artifact. Brain: Redemonstrated intraparenchymal hemorrhage in the left basal ganglia, extending inferiorly into the left temporal lobe and superiorly into the left frontal lobe/corona radiata, measuring approximately 4.5 x 2.2 x 3.9 cm (AP x TR x CC) (series 6, image 17 and series 10, image 14), likely unchanged when accounting for differences in technique and scan plane. A fluid fluid level is noted within the hematoma (series 2, image 6). Mild surrounding T2 hyperintense signal, likely edema. Mild local mass effect, with approximately 3 mm of left-to-right midline shift at the level of the thalami, unchanged. Mild restricted diffusion surrounding the hematoma, which is not unexpected. No more distant restricted diffusion to suggest additional acute infarct. No other area of acute hemorrhage. No acute mass. No abnormal enhancement. Mild narrowing of the left lateral ventricle, no evidence of hydrocephalus. No extra-axial collection. Vascular: Normal flow  voids. Skull and upper cervical spine: Normal marrow signal. Sinuses/Orbits: Mucosal thickening in the ethmoid air cells, frontal sinuses, and maxillary sinuses, with an air-fluid level in the right maxillary sinus. The orbits are unremarkable. Other: Fluid throughout the mastoid air cells. IMPRESSION: 1. Grossly unchanged intraparenchymal hemorrhage centered in the left basal ganglia, extending into the left frontal and temporal lobes, with mild surrounding edema and local mass effect, causing approximately 3 mm of left-to-right midline shift, unchanged. 2. Redemonstrated diffuse sinus mucosal thickening with air-fluid level in the right maxillary sinus. 3. No additional acute intracranial process. Electronically Signed   By: Wiliam Ke M.D.   On: 06/16/2021 17:09   DG Swallowing Func-Speech Pathology  Result Date: 06/17/2021 Table formatting from the original result was not included. Objective Swallowing Evaluation: Type of Study: MBS-Modified Barium Swallow Study  Patient Details Name: Wayne Mendez MRN: [id number] Date of Birth: 08/20/1965 Today's Date: 06/17/2021 Time: SLP Start Time (ACUTE ONLY): 0830 -SLP Stop Time (ACUTE ONLY): 0843 SLP Time Calculation (min) (ACUTE ONLY): 13 min Past Medical History: Past Medical History: Diagnosis Date  Hypertension  Past Surgical History: Past Surgical History: Procedure Laterality Date  TYMPANOSTOMY TUBE PLACEMENT   HPI: Azlaan Isidore is a 55 y.o. male PMHx HTN (untreated) with acute left basal ganglia intraparenchymal hemorrhage.  No data recorded  Recommendations for follow up therapy are one component of a multi-disciplinary discharge planning process, led by the attending physician.  Recommendations may be updated based on patient  status, additional functional criteria and insurance authorization. Assessment / Plan / Recommendation Clinical Impressions 06/17/2021 Clinical Impression Pt demosntrates mild sensory impairment leading to delayed swallow initaition with  all trials, despite this, even with mixed consistency and pressure to drink consecutively, at worst, pt only had trace flash penetration with thin liquids. No residuals. Pt able to attempt regualr solids and thin liquids. May need reminders for lingual sweep/oral care after meals given sensory changes on the right. Suspect pt may have intermittent cough with intake if he is drinking very quickly.  Will f/u for tolerance. SLP Visit Diagnosis Dysphagia, oropharyngeal phase (R13.12) Attention and concentration deficit following -- Frontal lobe and executive function deficit following -- Impact on safety and function Mild aspiration risk   Treatment Recommendations 06/17/2021 Treatment Recommendations Therapy as outlined in treatment plan below   Prognosis 06/17/2021 Prognosis for Safe Diet Advancement Good Barriers to Reach Goals Language deficits;Cognitive deficits Barriers/Prognosis Comment -- Diet Recommendations 06/17/2021 SLP Diet Recommendations Regular solids;Thin liquid Liquid Administration via Cup;Straw Medication Administration Whole meds with liquid Compensations Slow rate;Small sips/bites Postural Changes Seated upright at 90 degrees   Other Recommendations 06/17/2021 Recommended Consults -- Oral Care Recommendations Oral care before and after PO Other Recommendations Have oral suction available Follow Up Recommendations Acute inpatient rehab (3hours/day) Assistance recommended at discharge Frequent or constant Supervision/Assistance Functional Status Assessment Patient has had a recent decline in their functional status and demonstrates the ability to make significant improvements in function in a reasonable and predictable amount of time. Frequency and Duration  06/17/2021 Speech Therapy Frequency (ACUTE ONLY) min 2x/week Treatment Duration 2 weeks   Oral Phase 06/17/2021 Oral Phase Impaired Oral - Pudding Teaspoon -- Oral - Pudding Cup -- Oral - Honey Teaspoon -- Oral - Honey Cup -- Oral - Nectar  Teaspoon WFL Oral - Nectar Cup Right anterior bolus loss Oral - Nectar Straw Right anterior bolus loss Oral - Thin Teaspoon -- Oral - Thin Cup Right anterior bolus loss Oral - Thin Straw Right anterior bolus loss Oral - Puree WFL Oral - Mech Soft -- Oral - Regular WFL Oral - Multi-Consistency -- Oral - Pill -- Oral Phase - Comment --  Pharyngeal Phase 06/17/2021 Pharyngeal Phase Impaired Pharyngeal- Pudding Teaspoon -- Pharyngeal -- Pharyngeal- Pudding Cup -- Pharyngeal -- Pharyngeal- Honey Teaspoon -- Pharyngeal -- Pharyngeal- Honey Cup -- Pharyngeal -- Pharyngeal- Nectar Teaspoon Delayed swallow initiation-vallecula Pharyngeal -- Pharyngeal- Nectar Cup Delayed swallow initiation-pyriform sinuses Pharyngeal -- Pharyngeal- Nectar Straw Delayed swallow initiation-pyriform sinuses Pharyngeal -- Pharyngeal- Thin Teaspoon -- Pharyngeal -- Pharyngeal- Thin Cup Delayed swallow initiation-pyriform sinuses;Penetration/Aspiration before swallow Pharyngeal Material enters airway, remains ABOVE vocal cords then ejected out;Material does not enter airway Pharyngeal- Thin Straw Delayed swallow initiation-pyriform sinuses;Penetration/Aspiration before swallow Pharyngeal Material enters airway, remains ABOVE vocal cords then ejected out;Material does not enter airway Pharyngeal- Puree Delayed swallow initiation-vallecula Pharyngeal -- Pharyngeal- Mechanical Soft -- Pharyngeal -- Pharyngeal- Regular Delayed swallow initiation-vallecula Pharyngeal -- Pharyngeal- Multi-consistency -- Pharyngeal -- Pharyngeal- Pill -- Pharyngeal -- Pharyngeal Comment --  No flowsheet data found. DeBlois, [name] 06/17/2021, 10:38 AM                     ECHOCARDIOGRAM COMPLETE  Result Date: 06/15/2021    ECHOCARDIOGRAM REPORT   Patient Name:   Wayne Mendez Date of Exam: 06/15/2021 Medical Rec #:  [id number]    Height:       71.0 in Accession #:    [id number]   Weight:  216.0 lb Date of Birth:  [birth year]     BSA:          2.179 m  Patient Age:    55 years     BP:           141/92 mmHg Patient Gender: M            HR:           98 bpm. Exam Location:  Inpatient Procedure: 2D Echo, Cardiac Doppler and Color Doppler Indications:    Stroke  History:        Patient has no prior history of Echocardiogram examinations.  Sonographer:    Vanetta Shawl Referring Phys: [id number] HUNTER J COLLINS IMPRESSIONS  1. Left ventricular ejection fraction, by estimation, is 65 to 70%. The left ventricle has normal function. The left ventricle has no regional wall motion abnormalities. Indeterminate diastolic filling due to E-A fusion.  2. Right ventricular systolic function is normal. The right ventricular size is mildly enlarged.  3. Left atrial size was mildly dilated.  4. The mitral valve is normal in structure. No evidence of mitral valve regurgitation. No evidence of mitral stenosis.  5. The aortic valve is normal in structure. Aortic valve regurgitation is not visualized. No aortic stenosis is present.  6. There is borderline dilatation of the aortic root, measuring 38 mm.  7. The inferior vena cava is normal in size with greater than 50% respiratory variability, suggesting right atrial pressure of 3 mmHg. FINDINGS  Left Ventricle: Left ventricular ejection fraction, by estimation, is 65 to 70%. The left ventricle has normal function. The left ventricle has no regional wall motion abnormalities. The left ventricular internal cavity size was normal in size. There is  no left ventricular hypertrophy. Indeterminate diastolic filling due to E-A fusion. Right Ventricle: The right ventricular size is mildly enlarged. No increase in right ventricular wall thickness. Right ventricular systolic function is normal. Left Atrium: Left atrial size was mildly dilated. Right Atrium: Right atrial size was normal in size. Pericardium: There is no evidence of pericardial effusion. Mitral Valve: The mitral valve is normal in structure. No evidence of mitral valve  regurgitation. No evidence of mitral valve stenosis. Tricuspid Valve: The tricuspid valve is normal in structure. Tricuspid valve regurgitation is not demonstrated. No evidence of tricuspid stenosis. Aortic Valve: The aortic valve is normal in structure. Aortic valve regurgitation is not visualized. No aortic stenosis is present. Aortic valve mean gradient measures 6.0 mmHg. Aortic valve peak gradient measures 10.1 mmHg. Aortic valve area, by VTI measures 2.75 cm. Pulmonic Valve: The pulmonic valve was normal in structure. Pulmonic valve regurgitation is not visualized. No evidence of pulmonic stenosis. Aorta: The aortic root is normal in size and structure. There is borderline dilatation of the aortic root, measuring 38 mm. Venous: The inferior vena cava is normal in size with greater than 50% respiratory variability, suggesting right atrial pressure of 3 mmHg. IAS/Shunts: No atrial level shunt detected by color flow Doppler.  LEFT VENTRICLE PLAX 2D LVOT diam:     2.10 cm LV SV:         80 LV SV Index:   37 LVOT Area:     3.46 cm  RIGHT VENTRICLE RV Basal diam:  2.90 cm RV S prime:     20.00 cm/s LEFT ATRIUM           Index        RIGHT ATRIUM  Index LA Vol (A4C): 66.8 ml 30.66 ml/m  RA Area:     10.10 cm                                    RA Volume:   19.40 ml  8.90 ml/m  AORTIC VALVE                     PULMONIC VALVE AV Area (Vmax):    2.79 cm      PV Vmax:       1.45 m/s AV Area (Vmean):   2.72 cm      PV Peak grad:  8.4 mmHg AV Area (VTI):     2.75 cm AV Vmax:           159.00 cm/s AV Vmean:          113.000 cm/s AV VTI:            0.292 m AV Peak Grad:      10.1 mmHg AV Mean Grad:      6.0 mmHg LVOT Vmax:         128.00 cm/s LVOT Vmean:        88.900 cm/s LVOT VTI:          0.232 m LVOT/AV VTI ratio: 0.79  AORTA Ao Root diam: 3.80 cm  SHUNTS Systemic VTI:  0.23 m Systemic Diam: 2.10 cm Mihai Croitoru MD Electronically signed by Thurmon Fair MD Signature Date/Time: 06/15/2021/3:03:05 PM     Final      Subjective: - no chest pain, shortness of breath, no abdominal pain, nausea or vomiting.   Discharge Exam: BP (!) 163/101 (BP Location: Left Arm)    Pulse 79    Temp 97.8 F (36.6 C) (Oral)    Resp 18    Ht 6' (1.829 m)    Wt 96.8 kg    SpO2 98%    BMI 28.94 kg/m   General: Pt is alert, awake, not in acute distress Cardiovascular: RRR, S1/S2 +, no rubs, no gallops Respiratory: CTA bilaterally, no wheezing, no rhonchi Abdominal: Soft, NT, ND, bowel sounds + Extremities: no edema, no cyanosis   The results of significant diagnostics from this hospitalization (including imaging, microbiology, ancillary and laboratory) are listed below for reference.     Microbiology: Recent Results (from the past 240 hour(s))  Resp Panel by RT-PCR (Flu A&B, Covid) Nasopharyngeal Swab     Status: None   Collection Time: 06/15/21  1:57 PM   Specimen: Nasopharyngeal Swab; Nasopharyngeal(NP) swabs in vial transport medium  Result Value Ref Range Status   SARS Coronavirus 2 by RT PCR NEGATIVE NEGATIVE Final    Comment: (NOTE) SARS-CoV-2 target nucleic acids are NOT DETECTED.  The SARS-CoV-2 RNA is generally detectable in upper respiratory specimens during the acute phase of infection. The lowest concentration of SARS-CoV-2 viral copies this assay can detect is 138 copies/mL. A negative result does not preclude SARS-Cov-2 infection and should not be used as the sole basis for treatment or other patient management decisions. A negative result may occur with  improper specimen collection/handling, submission of specimen other than nasopharyngeal swab, presence of viral mutation(s) within the areas targeted by this assay, and inadequate number of viral copies(<138 copies/mL). A negative result must be combined with clinical observations, patient history, and epidemiological information. The expected result is Negative.  Fact Sheet for Patients:   BloggerCourse.com  Fact Sheet for Healthcare Providers:  SeriousBroker.it  This test is no t yet approved or cleared by the [place] FDA and  has been authorized for detection and/or diagnosis of SARS-CoV-2 by FDA under an Emergency Use Authorization (EUA). This EUA will remain  in effect (meaning this test can be used) for the duration of the COVID-19 declaration under Section 564(b)(1) of the Act, 21 U.S.C.section 360bbb-3(b)(1), unless the authorization is terminated  or revoked sooner.       Influenza A by PCR NEGATIVE NEGATIVE Final   Influenza B by PCR NEGATIVE NEGATIVE Final    Comment: (NOTE) The Xpert Xpress SARS-CoV-2/FLU/RSV plus assay is intended as an aid in the diagnosis of influenza from Nasopharyngeal swab specimens and should not be used as a sole basis for treatment. Nasal washings and aspirates are unacceptable for Xpert Xpress SARS-CoV-2/FLU/RSV testing.  Fact Sheet for Patients: BloggerCourse.com  Fact Sheet for Healthcare Providers: SeriousBroker.it  This test is not yet approved or cleared by the [place] FDA and has been authorized for detection and/or diagnosis of SARS-CoV-2 by FDA under an Emergency Use Authorization (EUA). This EUA will remain in effect (meaning this test can be used) for the duration of the COVID-19 declaration under Section 564(b)(1) of the Act, 21 U.S.C. section 360bbb-3(b)(1), unless the authorization is terminated or revoked.  Performed at [facility], [address], [place], [place] [address]   MRSA Next Gen by PCR, Nasal     Status: None   Collection Time: 06/15/21  3:38 PM   Specimen: Nasal Mucosa; Nasal Swab  Result Value Ref Range Status   MRSA by PCR Next Gen NOT DETECTED NOT DETECTED Final    Comment: (NOTE) The GeneXpert MRSA Assay (FDA approved for NASAL specimens only), is one component of a  comprehensive MRSA colonization surveillance program. It is not intended to diagnose MRSA infection nor to guide or monitor treatment for MRSA infections. Test performance is not FDA approved in patients less than 48 years old. Performed at [facility], [address], [place], [place] [address]      Labs: Basic Metabolic Panel: Recent Labs  Lab 06/15/21 1349 06/15/21 1350 06/15/21 1420 06/15/21 1602 06/15/21 1815 06/16/21 0201 06/16/21 0717 06/17/21 0416 06/18/21 0300 06/19/21 0237  NA 140 139  --   --    < > 143 144 143 140 138  K 3.3* 3.5  --   --   --   --   --  3.5 3.2* 3.6  CL 105 107  --   --   --   --   --  109 106 106  CO2  --  22  --   --   --   --   --  GLUCOSE 122* 123*  --   --   --   --   --  105* 100* 95  BUN 16 14  --   --   --   --   --  CREATININE 1.00 1.14  --   --   --   --   --  1.01 1.14 1.27*  CALCIUM  --  8.2*  --   --   --   --   --  8.4* 8.5* 8.7*  MG  --   --  2.0 2.0  --   --   --   --   --   --   PHOS  --   --  1.5* 1.4*  --   --   --  2.6  --   --    < > = values in this interval not displayed.   Liver Function Tests: Recent Labs  Lab 06/15/21 1350  AST 20  ALT 21  ALKPHOS 52  BILITOT 1.1  PROT 6.5  ALBUMIN 3.8   CBC: Recent Labs  Lab 06/15/21 1349 06/15/21 1350 06/17/21 0416 06/18/21 0300 06/19/21 0237  WBC  --  9.7 13.1* 13.0* 11.8*  NEUTROABS  --  7.7  --   --   --   HGB 15.3 15.2 15.2 14.6 14.7  HCT 45.0 45.0 45.3 44.2 45.2  MCV  --  87.9 87.8 88.2 88.5  PLT  --  160 168 160 174   CBG: No results for input(s): GLUCAP in the last 168 hours. Hgb A1c No results for input(s): HGBA1C in the last 72 hours. Lipid Profile No results for input(s): CHOL, HDL, LDLCALC, TRIG, CHOLHDL, LDLDIRECT in the last 72 hours. Thyroid function studies No results for input(s): TSH, T4TOTAL, T3FREE, THYROIDAB in the last 72 hours.  Invalid input(s): FREET3 Urinalysis    Component Value Date/Time   COLORURINE  YELLOW 06/15/2021 1357   APPEARANCEUR CLEAR 06/15/2021 1357   LABSPEC 1.010 06/15/2021 1357   PHURINE 6.5 06/15/2021 1357   GLUCOSEU NEGATIVE 06/15/2021 1357   HGBUR NEGATIVE 06/15/2021 1357   BILIRUBINUR NEGATIVE 06/15/2021 1357   KETONESUR NEGATIVE 06/15/2021 1357   PROTEINUR NEGATIVE 06/15/2021 1357   NITRITE NEGATIVE 06/15/2021 1357   LEUKOCYTESUR NEGATIVE 06/15/2021 1357    FURTHER DISCHARGE INSTRUCTIONS:   Get Medicines reviewed and adjusted: Please take all your medications with you for your next visit with your Primary MD   Laboratory/radiological data: Please request your Primary MD to go over all hospital tests and procedure/radiological results at the follow up, please ask your Primary MD to get all Hospital records sent to his/her office.   In some cases, they will be blood work, cultures and biopsy results pending at the time of your discharge. Please request that your primary care M.D. goes through all the records of your hospital data and follows up on these results.   Also Note the following: If you experience worsening of your admission symptoms, develop shortness of breath, life threatening emergency, suicidal or homicidal thoughts you must seek medical attention immediately by calling 911 or calling your MD immediately  if symptoms less severe.   You must read complete instructions/literature along with all the possible adverse reactions/side effects for all the Medicines you take and that have been prescribed to you. Take any new Medicines after you have completely understood and accpet all the possible adverse reactions/side effects.    Do not drive when taking Pain medications or sleeping medications (Benzodaizepines)   Do not take more than prescribed Pain, Sleep and Anxiety Medications. It is not advisable to combine anxiety,sleep and pain medications without talking with your primary care practitioner   Special Instructions: If you have smoked or chewed  Tobacco  in the last 2 yrs please stop smoking, stop any regular Alcohol  and or any Recreational drug use.   Wear Seat belts while driving.   Please note: You were cared for by a hospitalist during your hospital stay. Once you are discharged, your primary care physician will handle any further medical issues. Please note that NO REFILLS for any discharge medications will be authorized once you are discharged, as it is imperative that you return to your  primary care physician (or establish a relationship with a primary care physician if you do not have one) for your post hospital discharge needs so that they can reassess your need for medications and monitor your lab values.  Time coordinating discharge: 40 minutes  SIGNED:  Pamella Pert, MD, PhD 06/19/2021, 9:55 AM

## 2021-06-19 NOTE — Progress Notes (Signed)
Occupational Therapy Treatment Patient Details Name: Wayne Mendez MRN: [id number] DOB: [birth year] Today's Date: 06/19/2021   History of present illness Pt is 55 yo male who developed sudden R sided weakness and slurred speech walking into the gym on 06/15/21. 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  PMH: HTN.   OT comments  Pt making good progress with OT goals this session. He completed multiple sit to stands, using both RW and stedy. Due to impulsivity, and decreased awareness of safety and deficits, pt requiring mod-max multimodal cuing throughout. Pt balance is limited due to R sided weakness, requiring mod-max A support when weight shifting and completing exercises in standing, due to R sided lean. Pt very motivated to continue working and getting stronger. AIR continues to be best recommendation for pt going forward. OT will continue to follow acutely.    Recommendations for follow up therapy are one component of a multi-disciplinary discharge planning process, led by the attending physician.  Recommendations may be updated based on patient status, additional functional criteria and insurance authorization.    Follow Up Recommendations  Acute inpatient rehab (3hours/day)    Assistance Recommended at Discharge Frequent or constant Supervision/Assistance  Equipment Recommendations  BSC/3in1    Recommendations for Other Services Rehab consult    Precautions / Restrictions Precautions Precautions: Fall Precaution Comments: SBP <160 Restrictions Weight Bearing Restrictions: No       Mobility Bed Mobility               General bed mobility comments: Pt up in recliner on therapy entry    Transfers Overall transfer level: Needs assistance Equipment used: Rolling walker (2 wheels);Ambulation equipment used Transfers: Sit to/from Stand Sit to Stand: Mod assist;+2 physical assistance;+2 safety/equipment;Min guard           General transfer comment:  Pt initially stood with RW, requiring Mod A +2 to power up to standing and steady with R knee blocked. Pt then stood x4 in the stedy, requiring only min guard to power up and steady himself. Transfer via Lift Equipment: Stedy   Balance Overall balance assessment: Needs assistance Sitting-balance support: Feet supported Sitting balance-Leahy Scale: Fair     Standing balance support: Bilateral upper extremity supported;During functional activity Standing balance-Leahy Scale: Poor Standing balance comment: Pt working on weight shifting in standing, requring mod-max support both with RW and stedy.                           ADL either performed or assessed with clinical judgement   ADL Overall ADL's : Needs assistance/impaired             Lower Body Bathing: Moderate assistance;+2 for safety/equipment;+2 for physical assistance;Sit to/from stand Lower Body Bathing Details (indicate cue type and reason): Pt requiring mod A support to maintain balance and stand, leaning hips towards R and shoulders towrads L                       General ADL Comments: Session foxused on exercises and sit<>stands    Extremity/Trunk Assessment              Vision       Perception     Praxis      Cognition Arousal/Alertness: Awake/alert Behavior During Therapy: Impulsive Overall Cognitive Status: Impaired/Different from baseline Area of Impairment: Attention;Safety/judgement;Awareness  Current Attention Level: Selective     Safety/Judgement: Decreased awareness of safety;Decreased awareness of deficits Awareness: Emergent   General Comments: Pt very impulsive requiring frequent cues to slow down, listen, and for controlled movements.          Exercises Exercises: Other exercises Other Exercises Other Exercises: Using LUE to move RUE - shoulder flexion, bicep curls, and wrist extension/flexion Other Exercises: Weight shifting in standing  x5   Shoulder Instructions       General Comments VSS on RA    Pertinent Vitals/ Pain       Pain Assessment: No/denies pain  Home Living                                          Prior Functioning/Environment              Frequency  Min 2X/week        Progress Toward Goals  OT Goals(current goals can now be found in the care plan section)  Progress towards OT goals: Progressing toward goals  Acute Rehab OT Goals Patient Stated Goal: To be able to walk OT Goal Formulation: With patient Time For Goal Achievement: 06/30/21 Potential to Achieve Goals: Good ADL Goals Pt Will Perform Eating: with supervision;with set-up;sitting Pt Will Perform Grooming: with set-up;with supervision;sitting Pt Will Perform Upper Body Bathing: with set-up;with supervision;sitting Pt Will Perform Lower Body Bathing: with min assist;sitting/lateral leans;sit to/from stand Pt Will Transfer to Toilet: with min assist;bedside commode;squat pivot transfer Additional ADL Goal #1: Maintain midline postural control EOB with S during dynamic ADL tasks  Plan Discharge plan remains appropriate    Co-evaluation    PT/OT/SLP Co-Evaluation/Treatment: Yes Reason for Co-Treatment: Complexity of the patient's impairments (multi-system involvement);Necessary to address cognition/behavior during functional activity;For patient/therapist safety;To address functional/ADL transfers   OT goals addressed during session: ADL's and self-care      AM-PAC OT "6 Clicks" Daily Activity     Outcome Measure   Help from another person eating meals?: A Little Help from another person taking care of personal grooming?: A Little Help from another person toileting, which includes using toliet, bedpan, or urinal?: A Lot Help from another person bathing (including washing, rinsing, drying)?: A Lot Help from another person to put on and taking off regular upper body clothing?: A Lot Help from another  person to put on and taking off regular lower body clothing?: A Lot 6 Click Score: 14    End of Session Equipment Utilized During Treatment: Gait belt;Rolling walker (2 wheels);Other (comment) (stedy)  OT Visit Diagnosis: Unsteadiness on feet (R26.81);Other abnormalities of gait and mobility (R26.89);Muscle weakness (generalized) (M62.81);Apraxia (R48.2);Other symptoms and signs involving cognitive function;Cognitive communication deficit (R41.841);Hemiplegia and hemiparesis Symptoms and signs involving cognitive functions: Nontraumatic intracerebral hemorrhage Hemiplegia - Right/Left: Right Hemiplegia - dominant/non-dominant: Non-Dominant Hemiplegia - caused by: Nontraumatic intracerebral hemorrhage   Activity Tolerance Patient tolerated treatment well   Patient Left in chair;with call bell/phone within reach;with chair alarm set   Nurse Communication Mobility status;Need for lift equipment        Time: 0630-1601 OT Time Calculation (min): 34 min  Charges: OT General Charges $OT Visit: 1 Visit OT Treatments $Therapeutic Activity: 8-22 mins  Wayne Pruett H., OTR/L Acute Rehabilitation  Wayne Mendez Elane Talaya Lamprecht 06/19/2021, 1:41 PM

## 2021-06-19 NOTE — Progress Notes (Signed)
Report given to receiving RN on 4W.  All questions answered.  Bed brought down from room and CNA and RN utilized stedy and assisted patient from chair to bed.  Wife at bedside and gathered all belongings.  RN transported Patient to [phone].  Wife and all belongings taken at this time.  No s/s of distress noted.  Denies pain.

## 2021-06-19 NOTE — Progress Notes (Signed)
Patient arrived to unit around 1530. He denies pain at this time and appears to be well oriented.

## 2021-06-19 NOTE — Progress Notes (Signed)
Inpatient Rehabilitation Admission Medication Review by a Pharmacist   A complete drug regimen review was completed for this patient to identify any potential clinically significant medication issues.   High Risk Drug Classes Is patient taking? Indication by Medication  Antipsychotic No    Anticoagulant Yes Heparin- VTE prophylaxis  Antibiotic No    Opioid No    Antiplatelet No    Hypoglycemics/insulin No    Vasoactive Medication Yes Lopressor, amlodipine, lisinopril- hypertension  Chemotherapy No    Other Yes Protonix- GERD        Type of Medication Issue Identified Description of Issue Recommendation(s)  Drug Interaction(s) (clinically significant)        Duplicate Therapy        Allergy        No Medication Administration End Date        Incorrect Dose        Additional Drug Therapy Needed        Significant med changes from prior encounter (inform family/care partners about these prior to discharge).      Other            Clinically significant medication issues were identified that warrant physician communication and completion of prescribed/recommended actions by midnight of the next day:  No   Name of provider notified for urgent issues identified:    Provider Method of Notification:    Pharmacist comments:    Time spent performing this drug regimen review (minutes):  30  Thank you for involving pharmacy in this patient's care.  Loura Back, PharmD, BCPS Clinical Pharmacist Clinical phone for 06/19/2021 until 10p is [phone] 06/19/2021 9:46 PM  **Pharmacist phone directory can be found on [email or web address] listed under [facility]**

## 2021-06-20 DIAGNOSIS — I610 Nontraumatic intracerebral hemorrhage in hemisphere, subcortical: Secondary | ICD-10-CM | POA: Diagnosis not present

## 2021-06-20 MED ORDER — MELATONIN 3 MG PO TABS
3.0000 mg | ORAL_TABLET | Freq: Every day | ORAL | Status: DC
  Administered 2021-06-20: 3 mg via ORAL
  Filled 2021-06-20: qty 1

## 2021-06-20 MED ORDER — METOPROLOL TARTRATE 50 MG PO TABS
100.0000 mg | ORAL_TABLET | Freq: Two times a day (BID) | ORAL | Status: DC
  Administered 2021-06-20 – 2021-07-10 (×38): 100 mg via ORAL
  Filled 2021-06-20 (×40): qty 2

## 2021-06-20 NOTE — Evaluation (Signed)
Physical Therapy Assessment and Plan  Patient Details  Name: Wayne Mendez MRN: [id number] Date of Birth: May 28, 1966  PT Diagnosis: Abnormal posture, Abnormality of gait, Coordination disorder, Hemiparesis dominant, Hypotonia, Impaired cognition, Impaired sensation, and Muscle weakness Rehab Potential: Good ELOS: 21-24 days   Today's Date: 06/20/2021 PT Individual Time: 6720-9470 PT Individual Time Calculation (min): 75 min    Hospital Problem: Principal Problem:   ICH (intracerebral hemorrhage) ([place])   Past Medical History:  Past Medical History:  Diagnosis Date   Hypertension    Past Surgical History:  Past Surgical History:  Procedure Laterality Date   TYMPANOSTOMY TUBE PLACEMENT      Assessment & Plan Clinical Impression: Patient is a 55 year old right-handed male with history of hypertension.  Per chart review patient lives with spouse.  1 level home.  Independent prior to admission.  Presented 06/15/2021 with acute onset of right side weakness and slurred speech.  Blood pressure 240/110.  CT/MRI shows intraparenchymal hemorrhage centered in the left basal ganglia extending into the left frontal and temporal lobes with mild surrounding edema and local mass-effect causing approximate 3 mm left-to-right midline shift.  Echocardiogram ejection fraction 65 to 70% no wall motion abnormalities.  Admission chemistries unremarkable glucose 123, alcohol negative, urine drug screen negative.  Neurology service follow-up conservative care close monitoring of blood pressure initially maintained on Cleviprex.  He was cleared to begin subcutaneous heparin for DVT prophylaxis 06/17/2021.  Tolerating a regular diet.  Therapy evaluations completed due to patient's right side weakness and slurred speech was admitted for a comprehensive rehab program.  Patient transferred to CIR on 06/19/2021 .   Patient currently requires max with mobility secondary to muscle weakness, muscle joint tightness, and  muscle paralysis, decreased cardiorespiratoy endurance, abnormal tone, decreased coordination, and decreased motor planning, decreased attention to right, decreased attention, decreased awareness, decreased problem solving, decreased safety awareness, and delayed processing, and decreased sitting balance, decreased standing balance, decreased postural control, hemiplegia, and decreased balance strategies.  Prior to hospitalization, patient was independent  with mobility and lived with Spouse, Son in a House home.  Home access is 3Stairs to enter.  Patient will benefit from skilled PT intervention to maximize safe functional mobility, minimize fall risk, and decrease caregiver burden for planned discharge home with intermittent assist.  Anticipate patient will benefit from follow up OP at discharge.  PT - End of Session Activity Tolerance: Tolerates 30+ min activity with multiple rests Endurance Deficit: Yes PT Assessment Rehab Potential (ACUTE/IP ONLY): Good PT Barriers to Discharge: [place] home environment;Decreased caregiver support;Home environment access/layout;Insurance for SNF coverage PT Patient demonstrates impairments in the following area(s): Balance;Behavior;Edema;Endurance;Motor;Perception;Safety;Sensory;Skin Integrity PT Transfers Functional Problem(s): Bed Mobility;Bed to Chair;Car;Furniture;Floor PT Locomotion Functional Problem(s): Ambulation;Wheelchair Mobility;Stairs PT Plan PT Intensity: Minimum of 1-2 x/day ,45 to 90 minutes PT Frequency: 5 out of 7 days PT Duration Estimated Length of Stay: 21-24 days PT Treatment/Interventions: Ambulation/gait training;Balance/vestibular training;Cognitive remediation/compensation;Disease management/prevention;Discharge planning;Community reintegration;DME/adaptive equipment instruction;Functional electrical stimulation;Functional mobility training;Patient/family education;Pain management;Skin care/wound  management;Splinting/orthotics;Therapeutic Exercise;Therapeutic Activities;UE/LE Coordination activities;Visual/perceptual remediation/compensation;Stair training;UE/LE Strength taining/ROM;Psychosocial support;Neuromuscular re-education;Wheelchair propulsion/positioning PT Transfers Anticipated Outcome(s): Supevision assist with LRAD PT Locomotion Anticipated Outcome(s): CGA gait with LRAD. Mod I WC mobiltiy PT Recommendation Recommendations for Other Services: Neuropsych consult;Therapeutic Recreation consult Follow Up Recommendations: Outpatient PT Patient destination: Home Equipment Recommended: Wheelchair cushion (measurements);Rolling walker with 5" wheels   PT Evaluation Precautions/Restrictions Precautions Precautions: Fall Precaution Comments: Rt hemi Pain Pain Assessment Pain Scale: 0-10 Pain Score: 0-No pain Pain Interference Pain Interference Pain Effect on  Sleep: 0. Does not apply - I have not had any pain or hurting in the past 5 days Pain Interference with Therapy Activities: 1. Rarely or not at all Pain Interference with Day-to-Day Activities: 1. Rarely or not at all Home Living/Prior [place] Available Help at Discharge: Available PRN/intermittently;Family Type of Home: House Home Access: Stairs to enter [employer] of Steps: 3 Entrance Stairs-Rails: Left Home Layout: Two level;Able to live on main level with bedroom/bathroom Bathroom Shower/Tub: Multimedia programmer: Standard Bathroom Accessibility: Yes  Lives With: Spouse;Son Prior Function Level of Independence: Independent with basic ADLs;Independent with homemaking with ambulation  Able to Take Stairs?: Yes Driving: Yes Vocation: Full time employment Vision/Perception  Vision - History Ability to See in Adequate Light: 0 Adequate Vision - Assessment Eye Alignment: Within Functional Limits Ocular Range of Motion: Within Functional Limits Alignment/Gaze Preference:  Within Defined Limits Tracking/Visual Pursuits: Requires cues, head turns, or add eye shifts to track Saccades: Additional eye shifts occurred during testing Perception Perception: Impaired Inattention/Neglect: Does not attend to right visual field;Does not attend to right side of body Praxis Praxis: Impaired Praxis Impairment Details: Motor planning Praxis-Other Comments: ?deficit with motor planning vs mild receptive aphasia  Cognition Overall Cognitive Status: Impaired/Different from baseline Arousal/Alertness: Awake/alert Orientation Level: Oriented X4 Year: 2022 Month: December Day of Week: Incorrect Sustained Attention: Appears intact Immediate Memory Recall: Sock;Blue;Bed Memory Recall Sock: Without Cue Memory Recall Blue: Without Cue Memory Recall Bed: Without Cue Awareness Impairment: Emergent impairment Behaviors: Restless;Impulsive Safety/Judgment: Impaired Comments: Due to impulsivity and decreased insight into deficits Sensation Sensation Light Touch: Impaired Detail Light Touch Impaired Details: Impaired RLE;Absent RLE;Absent RUE;Impaired RUE Proprioception: Impaired Detail Proprioception Impaired Details: Absent RUE;Impaired RLE Coordination Gross Motor Movements are Fluid and Coordinated: No Fine Motor Movements are Fluid and Coordinated: No Coordination and Movement Description: Affected by Rt hemiparesis and balance deficits Finger Nose Finger Test: WNL Lt, unable to complete on the Rt Heel Shin Test: unable to perform due to hemiplegia Motor  Motor Motor: Hemiplegia;Abnormal tone;Abnormal postural alignment and control Motor - Skilled Clinical Observations: dense R sided hemiplegia   Trunk/Postural Assessment  Cervical Assessment Cervical Assessment: Exceptions to [facility] (forward head) Thoracic Assessment Thoracic Assessment: Exceptions to [facility] (rounded shoulders) Lumbar Assessment Lumbar Assessment: Exceptions to [facility] (posterior pelvic tilt) Postural  Control Postural Control: Deficits on evaluation (limited during dynamic sitting and dynamic standing self care activity)  Balance Balance Balance Assessed: Yes Static Sitting Balance Static Sitting - Balance Support: Left upper extremity supported Static Sitting - Level of Assistance: 5: Stand by assistance;4: Min assist Dynamic Sitting Balance Dynamic Sitting - Balance Support: During functional activity Dynamic Sitting - Level of Assistance: 4: Min assist (washing Rt LE with limb in figure 4 position during bathing) Static Standing Balance Static Standing - Balance Support: Left upper extremity supported Static Standing - Level of Assistance: 3: Mod assist Static Standing - Comment/# of Minutes: R knee blocked in stance Dynamic Standing Balance Dynamic Standing - Level of Assistance: 2: Max assist Dynamic Standing - Balance Activities: Lateral lean/weight shifting;Forward lean/weight shifting (completing perihygiene while standing in Stedy, large Rt lateral LOBs) Dynamic Standing - Comments: R knee blocked in stnace Extremity Assessment  RUE Assessment RUE Assessment: Exceptions to [facility] Passive Range of Motion (PROM) Comments: WNL RUE Body System: Neuro Brunstrum levels for arm and hand: Arm;Hand Brunstrum level for arm: Stage II Synergy is developing Brunstrum level for hand: Stage I Flaccidity RUE PROM (degrees) Overall PROM Right Upper Extremity: Within  functional limits for tasks performed RUE Tone RUE Tone: Hypotonic LUE Assessment LUE Assessment: Within Functional Limits Active Range of Motion (AROM) Comments: WNL RLE Assessment RLE Assessment: Exceptions to [facility] Passive Range of Motion (PROM) Comments: lacking 5 deg full ankle DF. Active Range of Motion (AROM) Comments: no volitional movement. intermittent activion with automatic movements General Strength Comments: 0/5 with MMT LLE Assessment LLE Assessment: Within Functional Limits General Strength Comments: grossly  4+/5 to 5/5  Care Tool Care Tool Bed Mobility Roll left and right activity   Roll left and right assist level: Moderate Assistance - Patient 50 - 74%    Sit to lying activity   Sit to lying assist level: Moderate Assistance - Patient 50 - 74%    Lying to sitting on side of bed activity   Lying to sitting on side of bed assist level: the ability to move from lying on the back to sitting on the side of the bed with no back support.: Moderate Assistance - Patient 50 - 74%     Care Tool Transfers Sit to stand transfer   Sit to stand assist level: Moderate Assistance - Patient 50 - 74%    Chair/bed transfer   Chair/bed transfer assist level: Moderate Assistance - Patient 50 - 74%     Toilet transfer   Assist Level: Dependent - Patient 0% (using Stedy pt 50%)    Car transfer   Car transfer assist level: Moderate Assistance - Patient 50 - 74%      Care Tool Locomotion Ambulation Ambulation activity did not occur: Safety/medical concerns     Max distance: 30  Walk 10 feet activity Walk 10 feet activity did not occur: Safety/medical concerns       Walk 50 feet with 2 turns activity Walk 50 feet with 2 turns activity did not occur: Safety/medical concerns      Walk 150 feet activity Walk 150 feet activity did not occur: Safety/medical concerns      Walk 10 feet on uneven surfaces activity Walk 10 feet on uneven surfaces activity did not occur: Safety/medical concerns      Stairs Stair activity did not occur: Safety/medical concerns        Walk up/down 1 step activity Walk up/down 1 step or curb (drop down) activity did not occur: Safety/medical concerns      Walk up/down 4 steps activity Walk up/down 4 steps activity did not occur: Safety/medical concerns      Walk up/down 12 steps activity Walk up/down 12 steps activity did not occur: Safety/medical concerns      Pick up small objects from floor   Pick up small object from the floor assist level: Total Assistance -  Patient < 25%    Wheelchair Is the patient using a wheelchair?: Yes Type of Wheelchair: Manual   Wheelchair assist level: Minimal Assistance - Patient > 75% Max wheelchair distance: 150  Wheel 50 feet with 2 turns activity   Assist Level: Minimal Assistance - Patient > 75%  Wheel 150 feet activity   Assist Level: Minimal Assistance - Patient > 75%    Refer to Care Plan for Long Term Goals  SHORT TERM GOAL WEEK 1 PT Short Term Goal 1 (Week 1): Pt will transfer to and from [facility] with min assist PT Short Term Goal 2 (Week 1): Pt will ambulate 26f with mod assist and LRAD PT Short Term Goal 3 (Week 1): Pt will propell WC [id number] supevision assist PT Short Term Goal 4 (  Week 1): pt will perform bed mobility with min assist 50% of trials  Recommendations for other services: Neuropsych and Therapeutic Recreation  Stress management and Outing/community reintegration  Skilled Therapeutic Intervention Mobility Bed Mobility Bed Mobility: Rolling Right;Rolling Left;Supine to Sit;Sit to Supine Rolling Right: Minimal Assistance - Patient > 75% Rolling Left: Moderate Assistance - Patient 50-74% Supine to Sit: Moderate Assistance - Patient 50-74% Sit to Supine: Moderate Assistance - Patient 50-74% Transfers Transfers: Sit to Stand;Stand to Sit;Squat Pivot Transfers;Stand Pivot Transfers Sit to Stand: Minimal Assistance - Patient > 75%;Moderate Assistance - Patient 50-74% Stand to Sit: Moderate Assistance - Patient 50-74%;Minimal Assistance - Patient > 75% Stand Pivot Transfers: Moderate Assistance - Patient 50 - 74% Squat Pivot Transfers: Moderate Assistance - Patient 50-74% Transfer (Assistive device): None Locomotion  Gait Ambulation: Yes Gait Assistance: 2 Helpers Gait Distance (Feet): 30 Feet Assistive device:  (rail in hall) Gait Assistance Details: Visual cues for safe use of DME/AE;Visual cues/gestures for precautions/safety;Visual cues/gestures for sequencing;Verbal cues for  technique;Verbal cues for sequencing;Verbal cues for gait pattern;Verbal cues for safe use of DME/AE;Manual facilitation for placement;Manual facilitation for weight bearing;Verbal cues for precautions/safety;Manual facilitation for weight shifting Gait Gait: Yes Gait Pattern: Impaired Gait Pattern: Step-to pattern;Decreased step length - right;Poor foot clearance - right;Decreased hip/knee flexion - right;Decreased dorsiflexion - right;Right hip hike;Right steppage;Narrow base of support Stairs / Additional Locomotion Stairs: No Wheelchair Mobility Wheelchair Mobility: Yes Wheelchair Assistance: Minimal assistance - Patient >75% Wheelchair Propulsion: Left lower extremity Wheelchair Parts Management: Needs assistance Distance: 150  Pt received sitting in WC and agreeable to PT. PT instructed patient in PT Evaluation and initiated treatment intervention; see above for results. PT educated patient in [place], rehab potential, rehab goals, and discharge recommendations along with recommendation for follow-up rehabilitation services. WC mobility with min assist with hemi technique with cues for use of LUE/LLE< but noted to have improved control with LLE only. Gait training at rail in hall as listed with PT advancing the RLE for 100% of steps and noted quad activation on 50% of steps in stance on the R. Pre-gait stepping with RW and DF wrap 3 x BLE with R knee blocked in stance to prevent buckling. Car transfer with mod assist and min cues for safety and blocking the R knee/hip to reduce fall risk and protect joint. Patient returned to room and left sitting in [facility] with call bell in reach and all needs met.  Wife educated on d/c expectations as well as rehab process for return to functional.       Discharge Criteria: Patient will be discharged from PT if patient refuses treatment 3 consecutive times without medical reason, if treatment goals not met, if there is a change in medical status, if patient makes  no progress towards goals or if patient is discharged from hospital.  The above assessment, treatment plan, treatment alternatives and goals were discussed and mutually agreed upon: by patient  Lorie Phenix 06/20/2021, 6:08 PM

## 2021-06-20 NOTE — Progress Notes (Signed)
PROGRESS NOTE   Subjective/Complaints: No new complaints this morning.  WBC decreased yesterday Tolerated SLP well today No issues reported overnight  ROS: +weakness   Objective:   No results found. Recent Labs    06/18/21 0300 06/19/21 0237  WBC 13.0* 11.8*  HGB 14.6 14.7  HCT 44.2 45.2  PLT 160 174   Recent Labs    06/18/21 0300 06/19/21 0237  NA 140 138  K 3.2* 3.6  CL 106 106  CO2 24 26  GLUCOSE 100* 95  BUN 15 19  CREATININE 1.14 1.27*  CALCIUM 8.5* 8.7*    Intake/Output Summary (Last 24 hours) at 06/20/2021 1347 Last data filed at 06/20/2021 1313 Gross per 24 hour  Intake 477 ml  Output 700 ml  Net -223 ml        Physical Exam: Vital Signs Blood pressure 139/61, pulse 73, temperature 98.3 F (36.8 C), temperature source Oral, resp. rate 16, height 6' (1.829 m), SpO2 98 %. Gen: no distress, normal appearing HEENT: oral mucosa pink and moist, NCAT Cardio: Reg rate Chest: normal effort, normal rate of breathing Abd: soft, non-distended Ext: no edema Psych: pleasant, normal affect Skin: intact Neurological:     Mental Status: He is alert.     Comments: Alert. Expressive language deficits, non-fluent.  Some receptive deficits as well, likely apraxic, too. Able to tell me where he is with verbal cueing and provide some basic biographical information. Right central 7 and tongue deviation. Right arm and leg 1/2 sensation for LT and PP. RUE trace deltoid but otherwise 0/5.  RLE 1/5 HE, HAD, trace to absent distally. DTR's trace. No resting tone. LUE and LLE 5/5 with normal sensation  Psychiatric:        Mood and Affect: Mood normal.        Behavior: Behavior normal.      Assessment/Plan: 1. Functional deficits which require 3+ hours per day of interdisciplinary therapy in a comprehensive inpatient rehab setting. Physiatrist is providing close team supervision and 24 hour management of active  medical problems listed below. Physiatrist and rehab team continue to assess barriers to discharge/monitor patient progress toward functional and medical goals  Care Tool:  Bathing              Bathing assist       Upper Body Dressing/Undressing Upper body dressing        Upper body assist      Lower Body Dressing/Undressing Lower body dressing            Lower body assist       Toileting Toileting    Toileting assist Assist for toileting: Independent with assistive device Assistive Device Comment: with urinal in bed   Transfers Chair/bed transfer  Transfers assist           Locomotion Ambulation   Ambulation assist              Walk 10 feet activity   Assist           Walk 50 feet activity   Assist           Walk 150 feet activity   Assist  Walk 10 feet on uneven surface  activity   Assist           Wheelchair     Assist               Wheelchair 50 feet with 2 turns activity    Assist            Wheelchair 150 feet activity     Assist          Blood pressure 139/61, pulse 73, temperature 98.3 F (36.8 C), temperature source Oral, resp. rate 16, height 6' (1.829 m), SpO2 98 %.    Medical Problem List and Plan: 1. Functional deficits right side weakness and aphasia secondary to left basal ganglia and external capsule ICH due to uncontrolled hypertension             -patient may shower             -ELOS/Goals: 13-20 days, mod I to set up with mobility and self-care and mod I to supervision with language             -PRAFO/WHO for right side  Initial CIR evaluations today.  2.  Impaired mobility: continue Heparin             -antiplatelet therapy: N/A 3. Pain Management: Tylenol as needed 4. Mood: Provide emotional support             -antipsychotic agents: N/A 5. Neuropsych: This patient is capable of making decisions on his own behalf. 6. Skin/Wound Care: Routine  skin checks 7. Fluids/Electrolytes/Nutrition: Routine in and outs with follow-up chemistries 8.  Hypertension.  Norvasc 10 mg daily, lisinopril 20 mg twice daily, increase lopressor to 100mg  BID.  9.  Hyperlipidemia.  Hold due to IPH.  -Consider low dose statin at discharge 10. AKI: placed nursing order to encourage 6-8 glasses of water per day, repeat BMP tomorrow.     LOS: 1 days A FACE TO FACE EVALUATION WAS PERFORMED  P Genessis Flanary 06/20/2021, 1:47 PM

## 2021-06-20 NOTE — Plan of Care (Signed)
°  Problem: RH Balance Goal: LTG Patient will maintain dynamic sitting balance (PT) Description: LTG:  Patient will maintain dynamic sitting balance with assistance during mobility activities (PT) Flowsheets (Taken 06/20/2021 1814) LTG: Pt will maintain dynamic sitting balance during mobility activities with:: Independent with assistive device  Goal: LTG Patient will maintain dynamic standing balance (PT) Description: LTG:  Patient will maintain dynamic standing balance with assistance during mobility activities (PT) Flowsheets (Taken 06/20/2021 1814) LTG: Pt will maintain dynamic standing balance during mobility activities with:: Supervision/Verbal cueing   Problem: RH Bed to Chair Transfers Goal: LTG Patient will perform bed/chair transfers w/assist (PT) Description: LTG: Patient will perform bed to chair transfers with assistance (PT). Flowsheets (Taken 06/20/2021 1814) LTG: Pt will perform Bed to Chair Transfers with assistance level: Supervision/Verbal cueing   Problem: RH Car Transfers Goal: LTG Patient will perform car transfers with assist (PT) Description: LTG: Patient will perform car transfers with assistance (PT). Flowsheets (Taken 06/20/2021 1814) LTG: Pt will perform car transfers with assist:: Contact Guard/Touching assist   Problem: RH Furniture Transfers Goal: LTG Patient will perform furniture transfers w/assist (OT/PT) Description: LTG: Patient will perform furniture transfers  with assistance (OT/PT). Flowsheets (Taken 06/20/2021 1814) LTG: Pt will perform furniture transfers with assist:: Supervision/Verbal cueing   Problem: RH Ambulation Goal: LTG Patient will ambulate in controlled environment (PT) Description: LTG: Patient will ambulate in a controlled environment, # of feet with assistance (PT). Flowsheets (Taken 06/20/2021 1814) LTG: Pt will ambulate in controlled environ  assist needed:: Contact Guard/Touching assist LTG: Ambulation distance in controlled  environment: 148ft with LRAD Goal: LTG Patient will ambulate in home environment (PT) Description: LTG: Patient will ambulate in home environment, # of feet with assistance (PT). Flowsheets (Taken 06/20/2021 1814) LTG: Pt will ambulate in home environ  assist needed:: Contact Guard/Touching assist LTG: Ambulation distance in home environment: 63ft with LRAD   Problem: RH Wheelchair Mobility Goal: LTG Patient will propel w/c in controlled environment (PT) Description: LTG: Patient will propel wheelchair in controlled environment, # of feet with assist (PT) Flowsheets (Taken 06/20/2021 1814) LTG: Pt will propel w/c in controlled environ  assist needed:: Independent with assistive device LTG: Propel w/c distance in controlled environment: 167ft Goal: LTG Patient will propel w/c in home environment (PT) Description: LTG: Patient will propel wheelchair in home environment, # of feet with assistance (PT). Flowsheets (Taken 06/20/2021 1814) LTG: Pt will propel w/c in home environ  assist needed:: Independent with assistive device Distance: wheelchair distance in controlled environment: 50   Problem: RH Stairs Goal: LTG Patient will ambulate up and down stairs w/assist (PT) Description: LTG: Patient will ambulate up and down # of stairs with assistance (PT) Flowsheets (Taken 06/20/2021 1814) LTG: Pt will ambulate up/down stairs assist needed:: Contact Guard/Touching assist LTG: Pt will  ambulate up and down number of stairs: 3 steps with 1 rail L to access home

## 2021-06-20 NOTE — Plan of Care (Signed)
°  Problem: RH Grooming Goal: LTG Patient will perform grooming w/assist,cues/equip (OT) Description: LTG: Patient will perform grooming with assist, with/without cues using equipment (OT) Flowsheets (Taken 06/20/2021 1546) LTG: Pt will perform grooming with assistance level of: Set up assist    Problem: RH Bathing Goal: LTG Patient will bathe all body parts with assist levels (OT) Description: LTG: Patient will bathe all body parts with assist levels (OT) Flowsheets (Taken 06/20/2021 1546) LTG: Pt will perform bathing with assistance level/cueing: Supervision/Verbal cueing   Problem: RH Dressing Goal: LTG Patient will perform upper body dressing (OT) Description: LTG Patient will perform upper body dressing with assist, with/without cues (OT). Flowsheets (Taken 06/20/2021 1546) LTG: Pt will perform upper body dressing with assistance level of: Set up assist Goal: LTG Patient will perform lower body dressing w/assist (OT) Description: LTG: Patient will perform lower body dressing with assist, with/without cues in positioning using equipment (OT) Flowsheets (Taken 06/20/2021 1546) LTG: Pt will perform lower body dressing with assistance level of: (excluding Teds) Supervision/Verbal cueing   Problem: RH Toileting Goal: LTG Patient will perform toileting task (3/3 steps) with assistance level (OT) Description: LTG: Patient will perform toileting task (3/3 steps) with assistance level (OT)  Flowsheets (Taken 06/20/2021 1546) LTG: Pt will perform toileting task (3/3 steps) with assistance level: Supervision/Verbal cueing   Problem: RH Toilet Transfers Goal: LTG Patient will perform toilet transfers w/assist (OT) Description: LTG: Patient will perform toilet transfers with assist, with/without cues using equipment (OT) Flowsheets (Taken 06/20/2021 1546) LTG: Pt will perform toilet transfers with assistance level of: Supervision/Verbal cueing   Problem: RH Tub/Shower Transfers Goal: LTG  Patient will perform tub/shower transfers w/assist (OT) Description: LTG: Patient will perform tub/shower transfers with assist, with/without cues using equipment (OT) Flowsheets (Taken 06/20/2021 1546) LTG: Pt will perform tub/shower stall transfers with assistance level of: Supervision/Verbal cueing

## 2021-06-20 NOTE — Evaluation (Signed)
Speech Language Pathology Assessment and Plan  Patient Details  Name: Wayne Mendez MRN: [id number] Date of Birth: 12-20-1965  SLP Diagnosis: Aphasia;Dysphagia;Cognitive Impairments  Rehab Potential: Good ELOS: 2 weeks    Today's Date: 06/20/2021 SLP Individual Time: 0900-0950 SLP Individual Time Calculation (min): 50 min   Hospital Problem: Principal Problem:   ICH (intracerebral hemorrhage) (HCC)  Past Medical History:  Past Medical History:  Diagnosis Date   Hypertension    Past Surgical History:  Past Surgical History:  Procedure Laterality Date   TYMPANOSTOMY TUBE PLACEMENT      Assessment / Plan / Recommendation  HPI: Wayne Mendez is a 55 year old right-handed male with history of hypertension.  Per chart review patient lives with spouse.  1 level home.  Independent prior to admission.  Presented 06/15/2021 with acute onset of right side weakness and slurred speech.  Blood pressure 240/110.  CT/MRI shows intraparenchymal hemorrhage centered in the left basal ganglia extending into the left frontal and temporal lobes with mild surrounding edema and local mass-effect causing approximate 3 mm left-to-right midline shift.  Echocardiogram ejection fraction 65 to 70% no wall motion abnormalities.  Admission chemistries unremarkable glucose 123, alcohol negative, urine drug screen negative.  Neurology service follow-up conservative care close monitoring of blood pressure initially maintained on Cleviprex.  He was cleared to begin subcutaneous heparin for DVT prophylaxis 06/17/2021.  Tolerating a regular diet.  Therapy evaluations completed due to patient's right side weakness and slurred speech was admitted for a comprehensive rehab program.  Clinical Impression  Patient presents with a primary expressive aphasia but with plan for further assessment of more complex level receptive language abilities. Speech was fluent at phrase, sentence and conversational level except for frequent  word-finding errors with patient exhibiting some frustration. Confrontational naming was mildly impaired, divergent and convergent naming were both moderately impaired. He was able to repeat at word and short phrase level however started having semantic and phonemic errors when advanced to sentence level. His reading comprehension was [facility] at word level but not tested at sentence/phrase level at this time. He did demonstrate awareness with errors when trying to think of a specific word to say however he did not consistently exhibit awareness to phonemic and semantic errors. He was impulsive and spoke quickly and did indicate that he had some attention difficulty previously. (stating that "Twitter is good for me..its constantly refreshing") He was able to describe object function well and was able to answer some basic level reasoning quesitons, 'what would you do if pipe leaking in house?' with his repsonse being "call someone go fix it." SLP completed a brief swallow assessment with patient taking straw sip of thin liquids (water) and exhibiting a mild dry sounding cough immediately afterwards but otherwise no observed changes in vocal quality, etc. Patient will benefit from skilled SLP intervention to maximize his ability to effectively communicate, use compensatory strategies, demonstrate awareness and ability to correct errors in language and functional tasks and anticipate he will benefit from outpatient SLP services upon discharge from CIR.  Skilled Therapeutic Interventions          Western Aphasia Battery, Boston Naming test, SLE, BSE  SLP Assessment  Patient will need skilled Speech Lanaguage Pathology Services during CIR admission    Recommendations  SLP Diet Recommendations: Thin;Age appropriate regular solids Liquid Administration via: Cup;Straw Medication Administration: Crushed with puree Supervision: Patient able to self feed Compensations: Slow rate;Small sips/bites Postural Changes and/or  Swallow Maneuvers: Seated upright 90 degrees Patient destination: Home Follow  up Recommendations: Outpatient SLP Equipment Recommended: None recommended by SLP    SLP Frequency 3 to 5 out of 7 days   SLP Duration  SLP Intensity  SLP Treatment/Interventions 2 weeks  Minumum of 1-2 x/day, 30 to 90 minutes  Speech/Language facilitation;Dysphagia/aspiration precaution training;Cognitive remediation/compensation;Environmental controls;Functional tasks;Multimodal communication approach;Patient/family education    Pain Pain Assessment Pain Scale: 0-10 Pain Score: 0-No pain  Prior Functioning Cognitive/Linguistic Baseline: Within functional limits Type of Home: House  Lives With: Spouse;Son Available Help at Discharge: Available PRN/intermittently;Family 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, Social research officer, government))  SLP Evaluation Cognition Overall Cognitive Status: Impaired/Different from baseline Arousal/Alertness: Awake/alert Orientation Level: Oriented X4 Attention: Sustained;Selective Sustained Attention: Appears intact Selective Attention: Impaired Selective Attention Impairment: Verbal complex;Verbal basic Memory: Appears intact Awareness: Impaired Awareness Impairment: Emergent impairment;Anticipatory impairment Behaviors: Restless;Impulsive Safety/Judgment: Impaired  Comprehension Auditory Comprehension Overall Auditory Comprehension: Impaired Yes/No Questions: Within Functional Limits Commands: Within Functional Limits Conversation: Simple Interfering Components: Attention EffectiveTechniques: Extra processing time Visual Recognition/Discrimination Discrimination: Within Function Limits Reading Comprehension Reading Status: Within funtional limits (WFL at word and short phrase level) Expression Expression Primary Mode of Expression: Verbal Verbal Expression Overall Verbal Expression:  Impaired Initiation: No impairment Automatic Speech: Name;Social Response Level of Generative/Spontaneous Verbalization: Sentence;Conversation;Phrase Repetition: Impaired Level of Impairment: Phrase level Naming: Impairment Confrontation: Impaired Convergent: 50-74% accurate Divergent: 25-49% accurate Verbal Errors: Phonemic paraphasias;Perseveration;Not aware of errors;Semantic paraphasias Pragmatics: No impairment Interfering Components: Attention Effective Techniques: Open ended questions;Written cues Non-Verbal Means of Communication: Other (comment) (able to select food menu items in list format) Written Expression Dominant Hand:  (patient told SLP he was right handed) Written Expression: Not tested Oral Motor Oral Motor/Sensory Function Overall Oral Motor/Sensory Function: Moderate impairment Facial ROM: Reduced right;Suspected CN VII (facial) dysfunction Facial Symmetry: Abnormal symmetry right;Suspected CN VII (facial) dysfunction Facial Strength: Reduced right;Suspected CN VII (facial) dysfunction Facial Sensation: Reduced right;Suspected CN V (Trigeminal) dysfunction Lingual ROM: Within Functional Limits Lingual Symmetry: Abnormal symmetry right;Suspected CN XII (hypoglossal) dysfunction Lingual Strength: Within Functional Limits Velum: Within Functional Limits Mandible: Within Functional Limits Motor Speech Respiration: Within functional limits Phonation: Normal Resonance: Within functional limits Articulation: Within functional limitis Intelligibility: Intelligible Motor Planning: Witnin functional limits Motor Speech Errors: Not applicable Effective Techniques: Slow rate  Care Tool Care Tool Cognition Ability to hear (with hearing aid or hearing appliances if normally used Ability to hear (with hearing aid or hearing appliances if normally used): 0. Adequate - no difficulty in normal conservation, social interaction, listening to TV   Expression of Ideas and  Wants Expression of Ideas and Wants: 2. Frequent difficulty - frequently exhibits difficulty with expressing needs and ideas   Understanding Verbal and Non-Verbal Content Understanding Verbal and Non-Verbal Content: 3. Usually understands - understands most conversations, but misses some part/intent of message. Requires cues at times to understand  Memory/Recall Ability Memory/Recall Ability : That he or she is in a hospital/hospital unit;Current season   Intelligibility: Intelligible  Bedside Swallowing Assessment General Date of Onset: 06/15/21 Previous Swallow Assessment: see H&P Diet Prior to this Study: Regular;Thin liquids Temperature Spikes Noted: No Respiratory Status: Room air History of Recent Intubation: No Behavior/Cognition: Alert;Cooperative;Pleasant mood Oral Cavity - Dentition: Adequate natural dentition Self-Feeding Abilities: Able to feed self Patient Positioning: Upright in bed Baseline Vocal Quality: Normal Volitional Cough: Strong Volitional Swallow: Able to elicit  Oral Care Assessment Does patient have any of the following "high(er) risk" factors?: None of the above Does patient  have any of the following "at risk" factors?: None of the above Patient is LOW RISK: Follow universal precautions (see row information) Ice Chips   Thin Liquid Thin Liquid: Impaired Presentation: Straw;Self Fed Pharyngeal  Phase Impairments: Cough - Immediate Other Comments: mild dry sounding cough after sip of liquid Nectar Thick   Honey Thick   Puree Puree: Not tested Solid Solid: Not tested BSE Assessment Risk for Aspiration Impact on safety and function: Mild aspiration risk  Short Term Goals: Week 1: SLP Short Term Goal 1 (Week 1): Patient will tolerate regular texture solids, thin liquids diet without significant amount of overt s/s aspiration or penetration with mod I. SLP Short Term Goal 2 (Week 1): Patient will name at least 10 items in a given category with modA  verbal, semantic cues. SLP Short Term Goal 3 (Week 1): Patient will participate in further assessment of more complex level (sentence, paragraph) reading comprehension. SLP Short Term Goal 4 (Week 1): Patient will demonstrate awareness to semantic and phonemic errors during structured language tasks, with modA verbal cues. SLP Short Term Goal 5 (Week 1): Patient will respond to open-ended questions utilizing word-level communication board and/or learned word-finding strategies, with minA verbal cues. SLP Short Term Goal 6 (Week 1): Patient will be able to summarize after SLP reads aloud short 3-4 sentence paragraph, with 80% accuracy and minA verbal cues.  Refer to Care Plan for Long Term Goals  Recommendations for other services: None   Discharge Criteria: Patient will be discharged from SLP if patient refuses treatment 3 consecutive times without medical reason, if treatment goals not met, if there is a change in medical status, if patient makes no progress towards goals or if patient is discharged from hospital.  The above assessment, treatment plan, treatment alternatives and goals were discussed and mutually agreed upon: by patient  Sonia Baller, MA, CCC-SLP Speech Therapy

## 2021-06-21 DIAGNOSIS — I610 Nontraumatic intracerebral hemorrhage in hemisphere, subcortical: Secondary | ICD-10-CM | POA: Diagnosis not present

## 2021-06-21 LAB — BASIC METABOLIC PANEL
Anion gap: 6 (ref 5–15)
BUN: 24 mg/dL — ABNORMAL HIGH (ref 6–20)
CO2: 24 mmol/L (ref 22–32)
Calcium: 8.4 mg/dL — ABNORMAL LOW (ref 8.9–10.3)
Chloride: 107 mmol/L (ref 98–111)
Creatinine, Ser: 1.08 mg/dL (ref 0.61–1.24)
GFR, Estimated: >60 mL/min (ref >60)
Glucose, Bld: 122 mg/dL — ABNORMAL HIGH (ref 70–99)
Potassium: 3.5 mmol/L (ref 3.5–5.1)
Sodium: 137 mmol/L (ref 135–145)

## 2021-06-21 MED ORDER — B COMPLEX-C PO TABS
1.0000 | ORAL_TABLET | Freq: Every day | ORAL | Status: DC
  Administered 2021-06-21 – 2021-07-10 (×20): 1 via ORAL
  Filled 2021-06-21 (×20): qty 1

## 2021-06-21 MED ORDER — MELATONIN 5 MG PO TABS
5.0000 mg | ORAL_TABLET | Freq: Every day | ORAL | Status: DC
  Administered 2021-06-21: 5 mg via ORAL
  Filled 2021-06-21: qty 1

## 2021-06-21 MED ORDER — POTASSIUM CHLORIDE 20 MEQ PO PACK
40.0000 meq | PACK | Freq: Once | ORAL | Status: AC
  Administered 2021-06-21: 40 meq via ORAL
  Filled 2021-06-21: qty 2

## 2021-06-21 NOTE — Progress Notes (Signed)
PROGRESS NOTE   Subjective/Complaints: No new complaints this morning Still slept poorly last night despite taking 3mg  of melatonin. Discussed increasing dose to 5mg  and grounds pass to get sunlight and he is agreeable  ROS: +weakness, +insomnia   Objective:   No results found. Recent Labs    06/19/21 0237  WBC 11.8*  HGB 14.7  HCT 45.2  PLT 174   Recent Labs    06/19/21 0237 06/21/21 0511  NA 138 137  K 3.6 3.5  CL 106 107  CO2 26 24  GLUCOSE 95 122*  BUN 19 24*  CREATININE 1.27* 1.08  CALCIUM 8.7* 8.4*    Intake/Output Summary (Last 24 hours) at 06/21/2021 0958 Last data filed at 06/21/2021 0212 Gross per 24 hour  Intake 474 ml  Output 225 ml  Net 249 ml        Physical Exam: Vital Signs Blood pressure (!) 126/92, pulse 66, temperature 98.1 F (36.7 C), temperature source Oral, resp. rate 18, height 6' (1.829 m), SpO2 97 %. Gen: no distress, normal appearing HEENT: oral mucosa pink and moist, NCAT Cardio: Reg rate Chest: normal effort, normal rate of breathing Abd: soft, non-distended Ext: no edema Psych: pleasant, normal affect Skin: intact Neurological:     Mental Status: He is alert.     Comments: Alert. Expressive language deficits, non-fluent.  Some receptive deficits as well, likely apraxic, too. Able to tell me where he is with verbal cueing and provide some basic biographical information. Right central 7 and tongue deviation. Right arm and leg 1/2 sensation for LT and PP. RUE trace deltoid but otherwise 0/5.  RLE 1/5 HE, HAD, trace to absent distally. DTR's trace. No resting tone. LUE and LLE 5/5 with normal sensation  Psychiatric:        Mood and Affect: Mood normal.        Behavior: Behavior normal.      Assessment/Plan: 1. Functional deficits which require 3+ hours per day of interdisciplinary therapy in a comprehensive inpatient rehab setting. Physiatrist is providing close team  supervision and 24 hour management of active medical problems listed below. Physiatrist and rehab team continue to assess barriers to discharge/monitor patient progress toward functional and medical goals  Care Tool:  Bathing    Body parts bathed by patient: Chest, Abdomen, Front perineal area, Buttocks, Right upper leg, Left upper leg, Right lower leg, Left lower leg, Face   Body parts bathed by helper: Right arm, Left arm     Bathing assist Assist Level: Maximal Assistance - Patient 24 - 49% (Stedy used for sit<stands)     Upper Body Dressing/Undressing Upper body dressing   What is the patient wearing?: Pull over shirt    Upper body assist Assist Level: Minimal Assistance - Patient > 75%    Lower Body Dressing/Undressing Lower body dressing      What is the patient wearing?: Underwear/pull up, Pants     Lower body assist Assist for lower body dressing: Maximal Assistance - Patient 25 - 49% (using Stedy)     Toileting Toileting Toileting Activity did not occur (Clothing management and hygiene only): N/A (no void or bm)  Toileting assist Assist  for toileting: Maximal Assistance - Patient 25 - 49% Assistive Device Comment: with urinal in bed   Transfers Chair/bed transfer  Transfers assist     Chair/bed transfer assist level: Moderate Assistance - Patient 50 - 74%     Locomotion Ambulation   Ambulation assist   Ambulation activity did not occur: Safety/medical concerns      Max distance: 30   Walk 10 feet activity   Assist  Walk 10 feet activity did not occur: Safety/medical concerns        Walk 50 feet activity   Assist Walk 50 feet with 2 turns activity did not occur: Safety/medical concerns         Walk 150 feet activity   Assist Walk 150 feet activity did not occur: Safety/medical concerns         Walk 10 feet on uneven surface  activity   Assist Walk 10 feet on uneven surfaces activity did not occur: Safety/medical  concerns         Wheelchair     Assist Is the patient using a wheelchair?: Yes Type of Wheelchair: Manual    Wheelchair assist level: Minimal Assistance - Patient > 75% Max wheelchair distance: 150    Wheelchair 50 feet with 2 turns activity    Assist        Assist Level: Minimal Assistance - Patient > 75%   Wheelchair 150 feet activity     Assist      Assist Level: Minimal Assistance - Patient > 75%   Blood pressure (!) 126/92, pulse 66, temperature 98.1 F (36.7 C), temperature source Oral, resp. rate 18, height 6' (1.829 m), SpO2 97 %.    Medical Problem List and Plan: 1. 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  Impaired mobility: continue Heparin             -antiplatelet therapy: N/A 3. Pain Management: Tylenol as needed 4. Mood: Provide emotional support             -antipsychotic agents: N/A 5. Neuropsych: This patient is capable of making decisions on his own behalf. 6. Skin/Wound Care: Routine skin checks 7. Fluids/Electrolytes/Nutrition: Routine in and outs with follow-up chemistries 8.  Hypertension.  Norvasc 10 mg daily, lisinopril 20 mg twice daily, increase lopressor to 100mg  BID.  9.  Hyperlipidemia.  Hold due to IPH.  -Consider low dose statin at discharge 10. AKI: placed nursing order to encourage 6-8 glasses of water per day, resolved on repeat, continue to monitor on Mondays 11. Suboptimal potassium: supplement 09-17-1973 today and repeat K+ tomorrow 12. Insomnia: melatonin 5mg  and grounds pass ordered to help reset circadian rhythm.     LOS: 2 days A FACE TO FACE EVALUATION WAS PERFORMED  Kash Davie 06/21/2021, 9:58 AM

## 2021-06-21 NOTE — Discharge Instructions (Addendum)
Inpatient Rehab Discharge Instructions  Edsel Shives Discharge date and time: No discharge date for patient encounter.   Activities/Precautions/ Functional Status: Activity: As tolerated Diet: Regular Wound Care: Routine skin checks Functional status:  ___ No restrictions     ___ Walk up steps independently ___ 24/7 supervision/assistance   ___ Walk up steps with assistance ___ Intermittent supervision/assistance  ___ Bathe/dress independently ___ Walk with walker     __x_ Bathe/dress with assistance ___ Walk Independently    ___ Shower independently ___ Walk with assistance    ___ Shower with assistance ___ No alcohol     ___ Return to work/school ________  COMMUNITY REFERRALS UPON DISCHARGE:    Outpatient: PT     OT    ST                 Agency: [facility]     Phone: [phone]             Appointment Date/Time: *Please expect follow-up within 7-10 business days. If you have not received follow-up, be sure to contact the site directly.*  Medical Equipment/Items Ordered:rolling walker and wheelchair                                                 Agency/Supplier:[facility] [phone]   Special Instructions:  No driving smoking or alcohol  My questions have been answered and I understand these instructions. I will adhere to these goals and the provided educational materials after my discharge from the hospital.  Patient/Caregiver Signature _______________________________ Date __________  Clinician Signature _______________________________________ Date __________  Please bring this form and your medication list with you to all your follow-up doctor's appointments.

## 2021-06-22 DIAGNOSIS — I610 Nontraumatic intracerebral hemorrhage in hemisphere, subcortical: Secondary | ICD-10-CM | POA: Diagnosis not present

## 2021-06-22 LAB — CBC WITH DIFFERENTIAL/PLATELET
Abs Immature Granulocytes: 0.02 10*3/uL (ref 0.00–0.07)
Basophils Absolute: 0 10*3/uL (ref 0.0–0.1)
Basophils Relative: 0 %
Eosinophils Absolute: 0.1 10*3/uL (ref 0.0–0.5)
Eosinophils Relative: 1 %
HCT: 45 % (ref 39.0–52.0)
Hemoglobin: 15 g/dL (ref 13.0–17.0)
Immature Granulocytes: 0 %
Lymphocytes Relative: 22 %
Lymphs Abs: 1.6 10*3/uL (ref 0.7–4.0)
MCH: 29.3 pg (ref 26.0–34.0)
MCHC: 33.3 g/dL (ref 30.0–36.0)
MCV: 87.9 fL (ref 80.0–100.0)
Monocytes Absolute: 0.7 10*3/uL (ref 0.1–1.0)
Monocytes Relative: 10 %
Neutro Abs: 4.7 10*3/uL (ref 1.7–7.7)
Neutrophils Relative %: 67 %
Platelets: 186 10*3/uL (ref 150–400)
RBC: 5.12 MIL/uL (ref 4.22–5.81)
RDW: 13.2 % (ref 11.5–15.5)
WBC: 7.2 10*3/uL (ref 4.0–10.5)
nRBC: 0 % (ref 0.0–0.2)

## 2021-06-22 LAB — COMPREHENSIVE METABOLIC PANEL
ALT: 133 U/L — ABNORMAL HIGH (ref 0–44)
AST: 84 U/L — ABNORMAL HIGH (ref 15–41)
Albumin: 3.7 g/dL (ref 3.5–5.0)
Alkaline Phosphatase: 56 U/L (ref 38–126)
Anion gap: 10 (ref 5–15)
BUN: 22 mg/dL — ABNORMAL HIGH (ref 6–20)
CO2: 27 mmol/L (ref 22–32)
Calcium: 9 mg/dL (ref 8.9–10.3)
Chloride: 102 mmol/L (ref 98–111)
Creatinine, Ser: 1.12 mg/dL (ref 0.61–1.24)
GFR, Estimated: >60 mL/min (ref >60)
Glucose, Bld: 112 mg/dL — ABNORMAL HIGH (ref 70–99)
Potassium: 3.8 mmol/L (ref 3.5–5.1)
Sodium: 139 mmol/L (ref 135–145)
Total Bilirubin: 1.3 mg/dL — ABNORMAL HIGH (ref 0.3–1.2)
Total Protein: 6.9 g/dL (ref 6.5–8.1)

## 2021-06-22 MED ORDER — TRAZODONE HCL 50 MG PO TABS
50.0000 mg | ORAL_TABLET | Freq: Every day | ORAL | Status: DC
  Administered 2021-06-22 – 2021-07-09 (×18): 50 mg via ORAL
  Filled 2021-06-22 (×18): qty 1

## 2021-06-22 MED ORDER — AMANTADINE HCL 100 MG PO CAPS
100.0000 mg | ORAL_CAPSULE | Freq: Every day | ORAL | Status: DC
  Administered 2021-06-22 – 2021-07-10 (×19): 100 mg via ORAL
  Filled 2021-06-22 (×19): qty 1

## 2021-06-22 NOTE — Progress Notes (Signed)
Inpatient Rehabilitation  Patient information reviewed and entered into eRehab system by Jamesia Linnen M. Dequann Vandervelden, M.A., CCC/SLP, PPS Coordinator.  Information including medical coding, functional ability and quality indicators will be reviewed and updated through discharge.    

## 2021-06-22 NOTE — Care Management (Signed)
Inpatient Rehabilitation Center Individual Statement of Services  Patient Name:  Wayne Mendez  Date:  06/22/2021  Welcome to the Inpatient Rehabilitation Center.  Our goal is to provide you with an individualized program based on your diagnosis and situation, designed to meet your specific needs.  With this comprehensive rehabilitation program, you will be expected to participate in at least 3 hours of rehabilitation therapies Monday-Friday, with modified therapy programming on the weekends.  Your rehabilitation program will include the following services:  Physical Therapy (PT), Occupational Therapy (OT), Speech Therapy (ST), 24 hour per day rehabilitation nursing, Therapeutic Recreaction (TR), Psychology, Neuropsychology, Care Coordinator, Rehabilitation Medicine, Nutrition Services, Pharmacy Services, and Other  Weekly team conferences will be held on Tuesdays to discuss your progress.  Your Inpatient Rehabilitation Care Coordinator will talk with you frequently to get your input and to update you on team discussions.  Team conferences with you and your family in attendance may also be held.  Expected length of stay: 21-24 days   Overall anticipated outcome: Supervision to Contact Guard  Depending on your progress and recovery, your program may change. Your Inpatient Rehabilitation Care Coordinator will coordinate services and will keep you informed of any changes. Your Inpatient Rehabilitation Care Coordinator's name and contact numbers are listed  below.  The following services may also be recommended but are not provided by the Inpatient Rehabilitation Center:  Driving Evaluations Home Health Rehabiltiation Services Outpatient Rehabilitation Services Vocational Rehabilitation   Arrangements will be made to provide these services after discharge if needed.  Arrangements include referral to agencies that provide these services.  Your insurance has been verified to be:  [facility]  Your primary  doctor is:  No PCP listed  Pertinent information will be shared with your doctor and your insurance company.  Inpatient Rehabilitation Care Coordinator:  Susie Cassette [phone] or ([phone]  Information discussed with and copy given to patient by: Gretchen Short, 06/22/2021, 1:17 PM

## 2021-06-22 NOTE — Progress Notes (Signed)
PROGRESS NOTE   Subjective/Complaints:  Melatonin hasn't helped poor sleep- goes to sleep 8-9 pm but wakes up 12-1am and cannot go back to sleep. Melatonin hasn't helped.  Also asking for therapeutic pass.  LBM yesterday.   ROS:  Pt denies SOB, abd pain, CP, N/V/C/D, and vision changes   Objective:   No results found. Recent Labs    06/22/21 0541  WBC 7.2  HGB 15.0  HCT 45.0  PLT 186   Recent Labs    06/21/21 0511 06/22/21 0541  NA 137 139  K 3.5 3.8  CL 107 102  CO2 24 27  GLUCOSE 122* 112*  BUN 24* 22*  CREATININE 1.08 1.12  CALCIUM 8.4* 9.0    Intake/Output Summary (Last 24 hours) at 06/22/2021 0842 Last data filed at 06/22/2021 0755 Gross per 24 hour  Intake 840 ml  Output 1000 ml  Net -160 ml        Physical Exam: Vital Signs Blood pressure 133/87, pulse 71, temperature 98.7 F (37.1 C), resp. rate 16, height 6' (1.829 m), SpO2 96 %.   General: awake, alert, appropriate, sitting up in bed; friend at bedside; NAD HENT: conjugate gaze; oropharynx moist CV: regular rate; no JVD Pulmonary: CTA B/L; no W/R/R- good air movement GI: soft, NT, ND, (+)BS Psychiatric: appropriate Neurological: mild to mod aphasia- word substitution noted Neurological:     Mental Status: He is alert.     Comments: Alert. Expressive language deficits, non-fluent.  Some receptive deficits as well, likely apraxic, too. Able to tell me where he is with verbal cueing and provide some basic biographical information. Right central 7 and tongue deviation. Right arm and leg 1/2 sensation for LT and PP. RUE trace deltoid but otherwise 0/5.  RLE 1/5 HE, HAD, trace to absent distally. DTR's trace. No resting tone. LUE and LLE 5/5 with normal sensation  Psychiatric:        Mood and Affect: Mood normal.        Behavior: Behavior normal.      Assessment/Plan: 1. Functional deficits which require 3+ hours per day of  interdisciplinary therapy in a comprehensive inpatient rehab setting. Physiatrist is providing close team supervision and 24 hour management of active medical problems listed below. Physiatrist and rehab team continue to assess barriers to discharge/monitor patient progress toward functional and medical goals  Care Tool:  Bathing    Body parts bathed by patient: Chest, Abdomen, Front perineal area, Buttocks, Face, Right upper leg, Left upper leg   Body parts bathed by helper: Right arm, Left arm, Left lower leg, Right lower leg     Bathing assist Assist Level: Maximal Assistance - Patient 24 - 49%     Upper Body Dressing/Undressing Upper body dressing   What is the patient wearing?: Pull over shirt    Upper body assist Assist Level: Minimal Assistance - Patient > 75%    Lower Body Dressing/Undressing Lower body dressing      What is the patient wearing?: Underwear/pull up, Pants     Lower body assist Assist for lower body dressing: Maximal Assistance - Patient 25 - 49%     Toileting Toileting Toileting Activity did  not occur Press photographer and hygiene only): N/A (no void or bm)  Toileting assist Assist for toileting: Total Assistance - Patient < 25% Assistive Device Comment: with urinal in bed   Transfers Chair/bed transfer  Transfers assist     Chair/bed transfer assist level: Moderate Assistance - Patient 50 - 74%     Locomotion Ambulation   Ambulation assist   Ambulation activity did not occur: Safety/medical concerns      Max distance: 30   Walk 10 feet activity   Assist  Walk 10 feet activity did not occur: Safety/medical concerns        Walk 50 feet activity   Assist Walk 50 feet with 2 turns activity did not occur: Safety/medical concerns         Walk 150 feet activity   Assist Walk 150 feet activity did not occur: Safety/medical concerns         Walk 10 feet on uneven surface  activity   Assist Walk 10 feet on  uneven surfaces activity did not occur: Safety/medical concerns         Wheelchair     Assist Is the patient using a wheelchair?: Yes Type of Wheelchair: Manual    Wheelchair assist level: Minimal Assistance - Patient > 75% Max wheelchair distance: 150    Wheelchair 50 feet with 2 turns activity    Assist        Assist Level: Minimal Assistance - Patient > 75%   Wheelchair 150 feet activity     Assist      Assist Level: Minimal Assistance - Patient > 75%   Blood pressure 133/87, pulse 71, temperature 98.7 F (37.1 C), resp. rate 16, height 6' (1.829 m), SpO2 96 %.    Medical Problem List and Plan: 1. 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   Continue CIR- PT, OT and SLP  2.  Impaired mobility: continue Heparin             -antiplatelet therapy: N/A 3. Pain Management: Tylenol as needed 4. Mood: Provide emotional support             -antipsychotic agents: N/A 5. Neuropsych: This patient is capable of making decisions on his own behalf. 6. Skin/Wound Care: Routine skin checks 7. Fluids/Electrolytes/Nutrition: Routine in and outs with follow-up chemistries 8.  Hypertension.  Norvasc 10 mg daily, lisinopril 20 mg twice daily, increase lopressor to 100mg  BID.   1/2- BP controlled- con't regimen 9.  Hyperlipidemia.  Hold due to IPH.  -Consider low dose statin at discharge 10. AKI: placed nursing order to encourage 6-8 glasses of water per day, resolved on repeat, continue to monitor on Mondays  1/2- BUN down  slightly 22- will push fluids and recheck Thursday 11. Suboptimal potassium: supplement 04-16-2002 today and repeat K+ tomorrow  1/2- K+ 3.8- will recheck Thursday to see if maintains 12.  Insomnia: melatonin 5mg  and grounds pass ordered to help reset circadian rhythm. 1/2- will stop melatonin and try Trazodone 50 mg QHS- also ordered therapeutic pass.       LOS: 3 days A FACE TO FACE EVALUATION WAS PERFORMED  Shaneca Orne 06/22/2021, 8:42 AM

## 2021-06-22 NOTE — Progress Notes (Signed)
Inpatient Rehabilitation Care Coordinator Assessment and Plan Patient Details  Name: Wayne Mendez MRN: [id number] Date of Birth: Nov 15, 1965  Today's Date: 06/22/2021  Hospital Problems: Principal Problem:   ICH (intracerebral hemorrhage) ([place])  Past Medical History:  Past Medical History:  Diagnosis Date   Hypertension    Past Surgical History:  Past Surgical History:  Procedure Laterality Date   TYMPANOSTOMY TUBE PLACEMENT     Social History:  reports that he has never smoked. He has never used smokeless tobacco. He reports current alcohol use. He reports that he does not currently use drugs.  Family / Support Systems Marital Status: Married How Long?: 25 years Patient Roles: Spouse Spouse/Significant Other: Wannetta Sender (wife): [phone] Children: Blended family. Two adult sons- 59 y.o. and 47 y.o. Other Supports: family friend Anticipated Caregiver: wife and family friend Ability/Limitations of Caregiver: None reported Caregiver Availability: 24/7 Family Dynamics: Pt lives with his wife and their son who is 52 y.o.  Social History Preferred language: English Religion:  Cultural Background: Pt has been working in Press photographer. Education: college Field seismologist - How often do you need to have someone help you when you read instructions, pamphlets, or other written material from your doctor or pharmacy?: Never Writes: Yes Employment Status: Employed Name of Employer: [employer] of Employment: 4 (years) Return to Work Plans: TBD. Pt wife reports she is waiting to hear back from HR about STD/LTD forms. Legal History/Current Legal Issues: Denies Guardian/Conservator: N/A   Abuse/Neglect Abuse/Neglect Assessment Can Be Completed: Yes Physical Abuse: Denies Verbal Abuse: Denies Sexual Abuse: Denies Exploitation of patient/patient's resources: Denies Self-Neglect: Denies  Patient response to: Social Isolation - How often do you feel lonely or isolated from those  around you?: Never  Emotional Status Pt's affect, behavior and adjustment status: Pt in good spirits at time of visit Recent Psychosocial Issues: Denies Psychiatric History: Denies Substance Abuse History: Pt admits to social drinking 2xs per week.  Patient / Family Perceptions, Expectations & Goals Pt/Family understanding of illness & functional limitations: Pt and family have a general understanding of pt care needs Premorbid pt/family roles/activities: Independent Anticipated changes in roles/activities/participation: Assistance with ADLs/IADLs Pt/family expectations/goals: Pt goal is to work on communication to have better conversations with others.  Community Resources Express Scripts: None Premorbid Home Care/DME Agencies: None Transportation available at discharge: TBD Is the patient able to respond to transportation needs?: Yes In the past 12 months, has lack of transportation kept you from medical appointments or from getting medications?: No In the past 12 months, has lack of transportation kept you from meetings, work, or from getting things needed for daily living?: No Resource referrals recommended: Neuropsychology  Discharge Planning Living Arrangements: Spouse/significant other, Children Support Systems: Children, Spouse/significant other, Friends/neighbors Type of Residence: Private residence Insurance Resources: Multimedia programmer (specify) Sports administrator) Financial Resources: Employment Financial Screen Referred: No Living Expenses: Mortgage Does the patient have any problems obtaining your medications?: No Home Management: Pt and wife both maange meal prep and house cleaning. Patient/Family Preliminary Plans: Pt wife to manage finances. Care Coordinator Barriers to Discharge: Other (comments) Care Coordinator Barriers to Discharge Comments: Pt will need outpatient therapies due to challenges with obtaining HH due to insurance. Care Coordinator Anticipated Follow Up  Needs: HH/OP Expected length of stay: 21-24 days  Clinical Impression SW met with pt in room to introduce self, explain role, and discuss discharge process. Pt is not a English as a second language teacher. No HCPOA. No DME. Pt is aware SW has spoken to wife.   1345-  SW spoke with [name] to introduce self, explain role, and discuss discharge process. She is aware SW will follow-up with updates after team conference.   Rana Snare 06/22/2021, 10:23 PM

## 2021-06-22 NOTE — Progress Notes (Addendum)
Occupational Therapy Session Note  Patient Details  Name: Wayne Mendez MRN: [id number] Date of Birth: Apr 06, 1966  Today's Date: 06/22/2021 OT Individual Time: 0962-8366 OT Individual Time Calculation (min): 30 min    Short Term Goals: Week 1:  OT Short Term Goal 1 (Week 1): Pt will complete LB dressing at sit<stand level using RW during 2 consecutive ADL sessions OT Short Term Goal 2 (Week 1): Pt will complete 2 grooming tasks while standing at the sink using RW for standing support as needed OT Short Term Goal 3 (Week 1): Pt will complete 1/3 components of toileting with no more than Mod balance assistance  Skilled Therapeutic Interventions/Progress Updates:    Pt resting in w/c upon arrival. OT intervention with focus on RUE NMR (see below). Squat pivot tranfsers w/c<>EOM with min A and max verbal cues for sequencing and safety. Minimal muscle activation noted following weight bearing activities seated EOM. Pt with motor planning deficit. Mirror unavailable to provide visual feedback. Pt returned to room and remained seated in w/c with all needs within reach and belt alarm activated. Half lap tray in place.  Therapy Documentation Precautions:  Precautions Precautions: Fall Precaution Comments: Rt hemi Restrictions Weight Bearing Restrictions: No   Pain:  Pt denies pain this morning    Other Treatments: Treatments Neuromuscular Facilitation: Right;Upper Extremity Weight Bearing Technique Weight Bearing Technique: Yes RUE Weight Bearing Technique: Extended arm standing Response to Weight Bearing Technique: pt tolerated well with no reports of pain, trace activation (? motor planning prohibitive)   Therapy/Group: Individual Therapy  Rich Brave 06/22/2021, 12:18 PM

## 2021-06-22 NOTE — Progress Notes (Signed)
Physical Therapy Session Note  Patient Details  Name: Wayne Mendez MRN: [id number] Date of Birth: 1966-05-06  Today's Date: 06/22/2021 PT Individual Time: 1100-1200 PT Individual Time Calculation (min): 60 min   Short Term Goals: Week 1:  PT Short Term Goal 1 (Week 1): Pt will transfer to and from [facility] with min assist PT Short Term Goal 2 (Week 1): Pt will ambulate 33ft with mod assist and LRAD PT Short Term Goal 3 (Week 1): Pt will propell WC 148ft with supevision assist PT Short Term Goal 4 (Week 1): pt will perform bed mobility with min assist 50% of trials  Skilled Therapeutic Interventions/Progress Updates:    Pt received seated in w/c in room, agreeable to PT session. No complaints of pain. Squat pivot transfer to the R with min A during session, cues for increased safety due to impulsivity. Sit to stand with min A to RW throughout session. Pt exhibits heavy L lateral lean in standing with minimal to no WBing through R side. Provided manual, tactile, verbal, and visual cues via mirror for increased weight shift onto RLE in standing. Pt's R knee buckles in standing even with limb blocked and he exhibits several LOB with controlled descent to sitting EOM. Standing LLE 1" step-taps with L handrail and mod to max A for balance with R knee blocked in stance, focus on upright posture and weight shift onto RLE in standing. Sit to stand with 1" step under LLE to encourage weight shift onto RLE, pt requires max manual cueing and mirror for visual feedback in order to weight shift onto RLE. Pt does exhibit some emotional lability during session and becomes tearful discussing his frustration with his current deficits and not being able to interact with his 67 year old son in the same manner as prior to his injury, provided emotional support and will recommend neuropsych. Pt performs squat pivot to the L back to w/c with mod A. Pt left seated up in w/c in room with needs in reach, quick release belt and chair  alarm in place at end of session.  Therapy Documentation Precautions:  Precautions Precautions: Fall Precaution Comments: Rt hemi Restrictions Weight Bearing Restrictions: No     Therapy/Group: Individual Therapy   Peter Congo, PT, DPT, CSRS  06/22/2021, 12:17 PM

## 2021-06-22 NOTE — Progress Notes (Signed)
Speech Language Pathology Daily Session Note  Patient Details  Name: Wayne Mendez MRN: [id number] Date of Birth: December 07, 1965  Today's Date: 06/22/2021 SLP Individual Time: 1401-1430 SLP Individual Time Calculation (min): 29 min  Short Term Goals: Week 1: SLP Short Term Goal 1 (Week 1): Patient will tolerate regular texture solids, thin liquids diet without significant amount of overt s/s aspiration or penetration with mod I. SLP Short Term Goal 2 (Week 1): Patient will name at least 10 items in a given category with modA verbal, semantic cues. SLP Short Term Goal 3 (Week 1): Patient will participate in further assessment of more complex level (sentence, paragraph) reading comprehension. SLP Short Term Goal 4 (Week 1): Patient will demonstrate awareness to semantic and phonemic errors during structured language tasks, with modA verbal cues. SLP Short Term Goal 5 (Week 1): Patient will respond to open-ended questions utilizing word-level communication board and/or learned word-finding strategies, with minA verbal cues. SLP Short Term Goal 6 (Week 1): Patient will be able to summarize after SLP reads aloud short 3-4 sentence paragraph, with 80% accuracy and minA verbal cues.  Skilled Therapeutic Interventions: Pt seen for skilled ST with focus on speech and language goals, upright in wheelchair and very motivated for therapeutic tasks. Pt reports increased expressive language skills from evaluation but remains with frustrations during word finding difficulties. Pt speaks at very rapid rate which he states is his baseline, provided education on SLOP speech strategies with emphasis on slow rate and pacing. Pt with much more fluent speech characteristics during unstructured language tasks/conversation, significant breakdown during structured tasks. For generative naming task, pt able to name an average of 2 items per category with significant extra time, mod-max cues increased to 3-4 items. Visual aid posted  in room for pt to recall and utilize slow rate and pacing for communication with staff and family. Pt left in wheelchair with alarm belt activated and all needs within reach. Cont ST POC.   Pain Pain Assessment Pain Scale: 0-10 Pain Score: 0-No pain  Therapy/Group: Individual Therapy  Tacey Ruiz 06/22/2021, 3:51 PM

## 2021-06-22 NOTE — IPOC Note (Signed)
Overall Plan of Care [facility]) Patient Details Name: Wayne Mendez MRN: [id number] DOB: Jul 19, 1965  Admitting Diagnosis: ICH (intracerebral hemorrhage) [facility]  Hospital Problems: Principal Problem:   ICH (intracerebral hemorrhage) ([facility])     Functional Problem List: Nursing Medication Management, Endurance, Safety  PT Balance, Behavior, Edema, Endurance, Motor, Perception, Safety, Sensory, Skin Integrity  OT Balance, Skin Integrity, Behavior, Motor, Safety, Sensory  SLP Cognition, Safety, Linguistic, Motor, Perception  TR         Basic ADLs: OT Grooming, Bathing, Dressing, Toileting     Advanced  ADLs: OT Simple Meal Preparation     Transfers: PT Bed Mobility, Bed to Chair, Car, [employer], Floor  OT Toilet, Tub/Shower     Locomotion: PT Ambulation, Psychologist, prison and probation services, Stairs     Additional Impairments: OT Fuctional Use of Upper Extremity  SLP Swallowing, Communication, Social Cognition expression Attention, Awareness  TR      Anticipated Outcomes Item Anticipated Outcome  Self Feeding No goal  Swallowing  mod I   Basic self-care  Marketing executive Transfers Supervision  Bowel/Bladder  n/a  Transfers  Supevision assist with LRAD  Locomotion  CGA gait with LRAD. Mod I WC mobiltiy  Communication  mod I multimodal basic communication wants/needs/thoughts, minA more complex expression  Cognition  mod I attention/awareness  Pain  n/a  Safety/Judgment  mod I and no falls   Therapy Plan: PT Intensity: Minimum of 1-2 x/day ,45 to 90 minutes PT Frequency: 5 out of 7 days PT Duration Estimated Length of Stay: 21-24 days OT Intensity: Minimum of 1-2 x/day, 45 to 90 minutes OT Frequency: 5 out of 7 days OT Duration/Estimated Length of Stay: 3 to 3.5 weeks SLP Intensity: Minumum of 1-2 x/day, 30 to 90 minutes SLP Frequency: 3 to 5 out of 7 days SLP Duration/Estimated Length of Stay: 2 weeks   Due to the current state of  emergency, patients may not be receiving their 3-hours of Medicare-mandated therapy.   Team Interventions: Nursing Interventions Patient/Family Education, Disease Management/Prevention, Medication Management, Discharge Planning  PT interventions Ambulation/gait training, Balance/vestibular training, Cognitive remediation/compensation, Disease management/prevention, Discharge planning, Community reintegration, DME/adaptive equipment instruction, Functional electrical stimulation, Functional mobility training, Patient/family education, Pain management, Skin care/wound management, Splinting/orthotics, Therapeutic Exercise, Therapeutic Activities, UE/LE Coordination activities, Visual/perceptual remediation/compensation, Stair training, UE/LE Strength taining/ROM, Psychosocial support, Neuromuscular re-education, Wheelchair propulsion/positioning  OT Interventions Balance/vestibular training, Disease mangement/prevention, Neuromuscular re-education, Self Care/advanced ADL retraining, Therapeutic Exercise, Wheelchair propulsion/positioning, UE/LE Strength taining/ROM, Pain management, DME/adaptive equipment instruction, Cognitive remediation/compensation, Community reintegration, Functional electrical stimulation, Patient/family education, Splinting/orthotics, UE/LE Coordination activities, Therapeutic Activities, Psychosocial support, Functional mobility training, Discharge planning  SLP Interventions Speech/Language facilitation, Dysphagia/aspiration precaution training, Cognitive remediation/compensation, Environmental controls, Functional tasks, Multimodal communication approach, Patient/family education  TR Interventions    SW/CM Interventions     Barriers to Discharge MD  Medical stability, Home enviroment access/loayout, Neurogenic bowel and bladder, and Weight  Nursing Decreased caregiver support, Lack of/limited family support, Weight, Medication compliance Lives in 1 level home with 2 steps to  enter and no rails. Lives with spouse who can provide min assist 24/7 at discharge.  PT Inaccessible home environment, Decreased caregiver support, Home environment access/layout, Insurance for SNF coverage    OT Decreased caregiver support, Lack of/limited family support    SLP Decreased caregiver support lives with wife and 66 year old son  SW       Team Discharge Planning: Destination: PT-Home ,OT- Home , SLP-Home Projected Follow-up: PT-Outpatient PT, OT-  Home health OT, SLP-Outpatient SLP Projected Equipment Needs: PT-Wheelchair cushion (measurements), Rolling walker with 5" wheels, OT- To be determined, SLP-None recommended by SLP Equipment Details: PT- , OT-  Patient/family involved in discharge planning: PT- Patient, Family member/caregiver,  OT-Patient, SLP-Patient  MD ELOS: 3-3.5 weeks Medical Rehab Prognosis:  Good Assessment: Pt is a 56 yr old male with hx of   L basal ganglia and external capsule ICH with R hemiparesis and aphasia;  Also has new dx of HTN- controlled on regimen and mild hypokalemia/AKI- also insomnia- will start Trazodone.    See Team Conference Notes for weekly updates to the plan of care

## 2021-06-22 NOTE — Progress Notes (Addendum)
Occupational Therapy Session Note  Patient Details  Name: Wayne Mendez MRN: [id number] Date of Birth: [birth year]  Today's Date: 06/22/2021 OT Individual Time: [id number] Individual Time Calculation (min): 71 min    Short Term Goals: Week 1:  OT Short Term Goal 1 (Week 1): Pt will complete LB dressing at sit<stand level using RW during 2 consecutive ADL sessions OT Short Term Goal 2 (Week 1): Pt will complete 2 grooming tasks while standing at the sink using RW for standing support as needed OT Short Term Goal 3 (Week 1): Pt will complete 1/3 components of toileting with no more than Mod balance assistance  Skilled Therapeutic Interventions/Progress Updates:    Pt resting in bed upon arrival. OT intervention with focus on bed mobility, sitting balance, functional tranfsers, sit<>stand, standing balance, BADL retraining, attention to Rt, and safety awareness to increase independence with BADLs. Supine>sit EOB with CGA and max verbal cues for safety and sequencing. Squat pivot transfer to w/c with mod A and max verbal cues for sequencing. Bathing/dressing with sit<>stand from w/c at sink. Bathing with mod A. UB dressing min A. LB dressing with mod A. Sit<>stand from w/c with min A and max verbal cues for sequencing. Standing balance with mod A for LB bathing/dressing. Pt required mod verbal cues for hemi dressing techniques. Max verbal cures througout session for attention to LUE. Educated pt on importance of attending to Lindsay. Pt remained seated in w/c with all needs within reach, half lap tray in place, and belt alarm activated.   Therapy Documentation Precautions:  Precautions Precautions: Fall Precaution Comments: Rt hemi Restrictions Weight Bearing Restrictions: No   Pain: Pain Assessment Pain Scale: 0-10 Pain Score: 0-No pain   Therapy/Group: Individual Therapy  Leroy Libman 06/22/2021, 9:34 AM

## 2021-06-23 DIAGNOSIS — I610 Nontraumatic intracerebral hemorrhage in hemisphere, subcortical: Secondary | ICD-10-CM | POA: Diagnosis not present

## 2021-06-23 NOTE — Progress Notes (Signed)
Patient ID: Wayne Mendez, male   DOB: 21-Jan-1966, 56 y.o.   MRN: [id number]   SW made several attempts to meet with pt but pt not in room due to therapy.   [id number] spoke with [name] to provide updates from team conference, and d/c date 1/20. Pt wife will get SW FMLA forms once received.   SW will continue to provide updates.   Loralee Pacas, MSW, [place] Office: [phone] Cell: [phone] Fax: [phone]

## 2021-06-23 NOTE — Progress Notes (Signed)
Physical Therapy Session Note  Patient Details  Name: Wayne Mendez MRN: [id number] Date of Birth: April 19, 1966  Today's Date: 06/23/2021 PT Individual Time: 1130-1200; 6010-9323 PT Individual Time Calculation (min): 30 min and 45 min   Short Term Goals: Week 1:  PT Short Term Goal 1 (Week 1): Pt will transfer to and from [facility] with min assist PT Short Term Goal 2 (Week 1): Pt will ambulate 12ft with mod assist and LRAD PT Short Term Goal 3 (Week 1): Pt will propell WC 147ft with supevision assist PT Short Term Goal 4 (Week 1): pt will perform bed mobility with min assist 50% of trials  Skilled Therapeutic Interventions/Progress Updates:    Session 1: Pt received seated in w/c in room, agreeable to PT session. No complaints of pain. Manual w/c propulsion 2 x 200 ft with use of L UE/LE at Supervision level with cues to attend to R visual field. Pt does run into several obstacles in R visual field during propulsion. Pt also requires cues for correct propulsion technique and he occasionally only utilizes UE or LE. Squat pivot transfers R and L w/c to/from mat table with min A this session with improved safety awareness this date. Pt left seated in w/c in room with needs in reach, quick release belt and chair alarm in place at end of session, family present.  Session 2: Pt received seated in w/c in room, agreeable to PT session. No complaints of pain. Manual w/c propulsion 2 x 200 ft with use of L UE/LE at Supervision level with cues to attend to R visual field and for obstacle avoidance. Sit to stand with min A at bottom of stairs with L handrail. Standing LLE 3" step-taps with mod A overall for balance with use of mirror for visual feedback, max cueing for safety awareness and for decreased impulsivity, R knee blocked. Pt exhibits R knee buckling in stance, trunk flexion with LLE lift, and decreased weight shift to the R and decreased stance on RLE. Pt exhibits improved ability to weight bear on RLE with  use of mirror, manual and verbal cues. Standing squats 3 x 10 reps with LUE support on stair rail, mod A for balance, mirror and manual cues for maintaining midline. Pt exhibits decreased ability to maintain weight in midline with onset of fatigue. Pt left seated in w/c in room with needs in reach, quick release belt and chair alarm in place at end of session.  Therapy Documentation Precautions:  Precautions Precautions: Fall Precaution Comments: Rt hemi Restrictions Weight Bearing Restrictions: No      Therapy/Group: Individual Therapy   Peter Congo, PT, DPT, CSRS  06/23/2021, 3:56 PM

## 2021-06-23 NOTE — Progress Notes (Signed)
PROGRESS NOTE   Subjective/Complaints:  Pt reports slept fnatastic- tiny bit groggy for a few minutes this AM, but no hangiver perpt.  Got great sleep.  Wearing R PRAFO and resting wrist/hand splint at night- no pain.  Ate 100% breakfast- feeling good.   ROS:   Pt denies SOB, abd pain, CP, N/V/C/D, and vision changes   Objective:   No results found. Recent Labs    06/22/21 0541  WBC 7.2  HGB 15.0  HCT 45.0  PLT 186   Recent Labs    06/21/21 0511 06/22/21 0541  NA 137 139  K 3.5 3.8  CL 107 102  CO2 24 27  GLUCOSE 122* 112*  BUN 24* 22*  CREATININE 1.08 1.12  CALCIUM 8.4* 9.0    Intake/Output Summary (Last 24 hours) at 06/23/2021 0913 Last data filed at 06/23/2021 0123 Gross per 24 hour  Intake 480 ml  Output 1150 ml  Net -670 ml        Physical Exam: Vital Signs Blood pressure 102/71, pulse 66, temperature 98.1 F (36.7 C), temperature source Oral, resp. rate 17, height 6' (1.829 m), SpO2 96 %.    General: awake, alert, appropriate, best friend at bedside; supine in bed;  NAD HENT: conjugate gaze; oropharynx moist CV: regular rate; no JVD Pulmonary: CTA B/L; no W/R/R- good air movement GI: soft, NT, ND, (+)BS Psychiatric: appropriate- interactive Neurological: alert- aphasic, but more word substitutions noted this AM- nonfluent  Neurological:     Mental Status: He is alert.     Comments: Alert. Expressive language deficits, non-fluent.  Some receptive deficits as well, likely apraxic, too. Able to tell me where he is with verbal cueing and provide some basic biographical information. Right central 7 and tongue deviation. Right arm and leg 1/2 sensation for LT and PP. RUE trace deltoid but otherwise 0/5.  RLE 1/5 HE, HAD, trace to absent distally. DTR's trace. No resting tone. LUE and LLE 5/5 with normal sensation  Psychiatric:        Mood and Affect: Mood normal.        Behavior: Behavior  normal.      Assessment/Plan: 1. Functional deficits which require 3+ hours per day of interdisciplinary therapy in a comprehensive inpatient rehab setting. Physiatrist is providing close team supervision and 24 hour management of active medical problems listed below. Physiatrist and rehab team continue to assess barriers to discharge/monitor patient progress toward functional and medical goals  Care Tool:  Bathing    Body parts bathed by patient: Right arm, Chest, Abdomen, Front perineal area, Buttocks, Right upper leg, Left upper leg, Face   Body parts bathed by helper: Left arm, Right lower leg, Left lower leg     Bathing assist Assist Level: Moderate Assistance - Patient 50 - 74%     Upper Body Dressing/Undressing Upper body dressing   What is the patient wearing?: Pull over shirt    Upper body assist Assist Level: Minimal Assistance - Patient > 75%    Lower Body Dressing/Undressing Lower body dressing      What is the patient wearing?: Underwear/pull up, Pants     Lower body assist Assist for lower  body dressing: Moderate Assistance - Patient 50 - 74%     Toileting Toileting Toileting Activity did not occur (Clothing management and hygiene only): N/A (no void or bm)  Toileting assist Assist for toileting: Total Assistance - Patient < 25% Assistive Device Comment: with urinal in bed   Transfers Chair/bed transfer  Transfers assist     Chair/bed transfer assist level: Moderate Assistance - Patient 50 - 74%     Locomotion Ambulation   Ambulation assist   Ambulation activity did not occur: Safety/medical concerns      Max distance: 30   Walk 10 feet activity   Assist  Walk 10 feet activity did not occur: Safety/medical concerns        Walk 50 feet activity   Assist Walk 50 feet with 2 turns activity did not occur: Safety/medical concerns         Walk 150 feet activity   Assist Walk 150 feet activity did not occur: Safety/medical  concerns         Walk 10 feet on uneven surface  activity   Assist Walk 10 feet on uneven surfaces activity did not occur: Safety/medical concerns         Wheelchair     Assist Is the patient using a wheelchair?: Yes Type of Wheelchair: Manual    Wheelchair assist level: Minimal Assistance - Patient > 75% Max wheelchair distance: 150    Wheelchair 50 feet with 2 turns activity    Assist        Assist Level: Minimal Assistance - Patient > 75%   Wheelchair 150 feet activity     Assist      Assist Level: Minimal Assistance - Patient > 75%   Blood pressure 102/71, pulse 66, temperature 98.1 F (36.7 C), temperature source Oral, resp. rate 17, height 6' (1.829 m), SpO2 96 %.    Medical Problem List and Plan: 1. 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   1/3- no side effects so far from Amantadine- con't CIR- PT, OT, and SLP- resting hand splint; and PRAFO at night.  2.  Impaired mobility: continue Heparin             -antiplatelet therapy: N/A 3. Pain Management: Tylenol as needed 4. Mood: Provide emotional support             -antipsychotic agents: N/A 5. Neuropsych: This patient is capable of making decisions on his own behalf. 6. Skin/Wound Care: Routine skin checks 7. Fluids/Electrolytes/Nutrition: Routine in and outs with follow-up chemistries 8.  Hypertension.  Norvasc 10 mg daily, lisinopril 20 mg twice daily, increase lopressor to 100mg  BID.   1/3- BP controlled- con't regimen 9.   Hyperlipidemia.  Hold due to IPH.  -Consider low dose statin at discharge 10. AKI: placed nursing order to encourage 6-8 glasses of water per day, resolved on repeat, continue to monitor  on Mondays  1/2- BUN down slightly 22- will push fluids and recheck Thursday 11. Suboptimal potassium: supplement 60meq today and repeat K+ tomorrow  1/2- K+ 3.8- will recheck Thursday to see if maintains 12. Insomnia: melatonin 5mg  and grounds pass ordered to help reset circadian rhythm. 1/2- will stop melatonin and try Trazodone 50 mg QHS- also ordered therapeutic pass.   1/3- slept so much better- con't regimen      LOS: 4 days A FACE TO FACE EVALUATION WAS PERFORMED  Erby Sanderson 06/23/2021, 9:13 AM

## 2021-06-23 NOTE — Progress Notes (Signed)
Speech Language Pathology Daily Session Note  Patient Details  Name: Wayne Mendez MRN: [id number] Date of Birth: July 01, 1965  Today's Date: 06/23/2021 SLP Individual Time: 1017-1100 SLP Individual Time Calculation (min): 43 min  Short Term Goals: Week 1: SLP Short Term Goal 1 (Week 1): Patient will tolerate regular texture solids, thin liquids diet without significant amount of overt s/s aspiration or penetration with mod I. SLP Short Term Goal 2 (Week 1): Patient will name at least 10 items in a given category with modA verbal, semantic cues. SLP Short Term Goal 3 (Week 1): Patient will participate in further assessment of more complex level (sentence, paragraph) reading comprehension. SLP Short Term Goal 4 (Week 1): Patient will demonstrate awareness to semantic and phonemic errors during structured language tasks, with modA verbal cues. SLP Short Term Goal 5 (Week 1): Patient will respond to open-ended questions utilizing word-level communication board and/or learned word-finding strategies, with minA verbal cues. SLP Short Term Goal 6 (Week 1): Patient will be able to summarize after SLP reads aloud short 3-4 sentence paragraph, with 80% accuracy and minA verbal cues.  Skilled Therapeutic Interventions: Pt seen for skilled ST with focus on speech/language goals, brought to Speech room for change of scenery. Pt continues with impulsive expressive language characteristics with mod A cues throughout session to reduce rate of speech. Pt reading rainbow passage aloud with ~75% accuracy, episodes of neologisms and semantic paraphasia present with varying patient awareness. SLP providing visual aid to attempt to increase pacing and slow rate during passage reading, pt able to utilize initially but fading to require mod A cues as reading continued. SLP facilitating word finding exercise focusing on pt producing target word by providing varying min-max A multimodal cues. Pt does get frustrated easily but is  easily placated. Pt encouraged to continue to utilize slow rate and pacing during speech and language tasks. Pt brought back to room and left in wheelchair with belt alarm activated and all needs within reach. Cont ST POC.   Pain Pain Assessment Pain Scale: 0-10 Pain Score: 0-No pain  Therapy/Group: Individual Therapy  Tacey Ruiz 06/23/2021, 11:23 AM

## 2021-06-23 NOTE — Progress Notes (Signed)
Physical Therapy Session Note  Patient Details  Name: Wayne Mendez MRN: [id number] Date of Birth: [birth year]  Today's Date: 06/23/2021 PT Individual Time: 1300-1345 PT Individual Time Calculation (min): 45 min   Short Term Goals: Week 1:  PT Short Term Goal 1 (Week 1): Pt will transfer to and from [facility] with min assist PT Short Term Goal 2 (Week 1): Pt will ambulate 61ft with mod assist and LRAD PT Short Term Goal 3 (Week 1): Pt will propell WC 155ft with supevision assist PT Short Term Goal 4 (Week 1): pt will perform bed mobility with min assist 50% of trials  Skilled Therapeutic Interventions/Progress Updates:  Pt received seated in WC in room, denied pain and was agreeable to PT. Emphasis of session on transfers, midline orientation and R NMR. Pt self-propelled >150' using hemi-technique of L side w/S* and mod verbal cues to avoid obstacles on R side and slow down, as pt ran into the wall and several obstacles on R side without knowing. Pt perseverated on apologizing to therapist throughout session and stating he was "off", repeatedly assured pt that he had no reason to apologize.   Sit <>stand pivot from WC to mat table w/RW and mod A due to impulsiveness and poor positioning of BLEs when coming to stand, causing his R foot to be twisted with weightbearing and pt losing balance to R side, mod A for controlled lower to mat. Lengthy discussion regarding what went wrong with transfer, pt seemingly unaware of poor body positioning and impulsiveness. Blocked sit <>stand practice in front of mirror for visual biofeedback on trunk position to promote midline orientation, min-mod A throughout. Noted significant L trunk rotation when coming to stand, causing R hip to IR and R foot to invert, throwing pt's balance off when standing. Educated pt on setting feet hip-width apart and anterior weight shifting when coming to stand to reduce truncal rotation and promoting stable BOS. Pt able to perform 2 sit  <>stands w/min A and reduced truncal rotation, but continued to demonstrate narrow BOS and significant truncal lean to L. When pt attempted to self-correct truncal lean, he demonstrated overcorrection and fell into therapist on R side, requiring mod-max A for safe lowering to mat table. Pt extremely discouraged with his performance during session, therapist provided significant encouragement regarding his function which pt disregarded.   Pt performed lateral scoot transfer from mat to [facility] on L side w/CGA. Pt self-propelled >150' back to room using hemi-technique of L side and S*, mod verbal cues to avoid obstacles on R side. Pt was left seated in WC in room, all needs in reach.   Therapy Documentation Precautions:  Precautions Precautions: Fall Precaution Comments: Rt hemi Restrictions Weight Bearing Restrictions: No   Therapy/Group: Individual Therapy Cruzita Lederer Kirk Sampley, PT, DPT  06/23/2021, 7:43 AM

## 2021-06-23 NOTE — Patient Care Conference (Signed)
Inpatient RehabilitationTeam Conference and Plan of Care Update Date: 06/23/2021   Time: 11:00 AM    Patient Name: Wayne Mendez      Medical Record Number: [id number]  Date of Birth: 08/06/1965 Sex: Male         Room/Bed: 4W09C/4W09C-01 Payor Info: Payor: Advertising copywriter / Plan: Advertising copywriter OTHER / Product Type: *No Product type* /    Admit Date/Time:  06/19/2021  4:37 PM  Primary Diagnosis:  ICH (intracerebral hemorrhage) [facility])  Hospital Problems: Principal Problem:   ICH (intracerebral hemorrhage) [facility])    Expected Discharge Date: Expected Discharge Date: 07/10/21  Team Members Present: Physician leading conference: Dr. Genice Rouge Social Worker Present: Cecile Sheerer, LCSWA Nurse Present: Kennyth Arnold, RN PT Present: Peter Congo, PT OT Present: Ardis Rowan, COTA;Jennifer Smith, OT SLP Present: Gerda Diss, SLP PPS Coordinator present : Fae Pippin, SLP     Current Status/Progress Goal Weekly Team Focus  Bowel/Bladder   continent urine, incontinent bowel LBM 1/1  Become continent Bowel and Bladder  Assist with toileting needs as needed   Swallow/Nutrition/ Hydration   regular/thin  mod I  ensure toleration of diet   ADL's   bathing-mod A; UB dressing-mod A; LB dressing-max A; transfers-mod A; Rt inattention; impulsive  supervision overall  BADL retraining; RUE NMR, tranfsers, safety awareness, attention to Rt   Mobility   CGA bed mobility, min to mod A squat pivot transfer, min A w/c mobility, gait max A +2 at rail  Supervision transfers, CGA gait and stairs  R NMR, safety, transfers   Communication   min-mod A  mod I basic multimodal, supervision to minA more complex  strategies for communication   Safety/Cognition/ Behavioral Observations  sup A  mod I awareness, attention  awareness to deficits   Pain   Denies pain  <3 out of 10  Assess q shift and PRN.   Skin   Brusing to abdomen  no new skin breakdown  assess skin q shift     Discharge  Planning:  D/c to home with his wife and support from his friend that will be coming back from [place] to help provide assistance at time of discharge.   Team Discussion: Has right prafo boot and hand splint. Trazodone added for sleep and reported slept better. Started Amantadine. BP doing okay. Continent bladder, incontinent bowel. Discharging home with wife. Friend from [place] to come in to help. Will need outpatient. Min/mod assist squat pivot, min assist WC. Min assist +2 at rails, min/mod assist transfers. Max cues for safety. Impulsive requires cues to slow down. Inattention to right side, using half-lap tray. Going to try E-stem. Continue to push fluids. Patient on target to meet rehab goals: yes, contact guard gait and stairs, supervision ADL's, mod I to supervision for SLP.  *See Care Plan and progress notes for long and short-term goals.   Revisions to Treatment Plan:  Adjusting medications  Teaching Needs: Family education, medication/pain/sleep management, bowel management, safety awareness, transfer/gait training, etc.  Current Barriers to Discharge: Decreased caregiver support, Home enviroment access/layout, Incontinence, Lack of/limited family support, Weight, Medication compliance, and impulsive, right side weakness.  Possible Resolutions to Barriers: Family education Follow-up PT/OT/SLP Order recommended DME Continue half-lap tray to prevent shoulder pain     Medical Summary Current Status: CVA with R hemi; continet bladder- incontinent bowel; LBM 1/1; no pain; slept better with trazodone; skin good  Barriers to Discharge: Home enviroment access/layout;Incontinence;Insurance for SNF coverage;Medical stability;Medication compliance;Weight bearing restrictions  Barriers  to Discharge Comments: needs outpt therapy due to insurance; wife and best friend will help Possible Resolutions to [employer] Focus: min-mod A with PT- max A of 2 gait; goals Supervision;  limitaitons-  very impulsive- decreased safety awareness and insight into deficits; R hemiparesis; needs constant cures to slow down; R inattention; has half lap tray;  no R shoulder pain yet- but at high risk; max VC's for safety; some carryover issues; motor planning- estim tomorrow; min-mod Communication currently;  d/c 1/20   Continued Need for Acute Rehabilitation Level of Care: The patient requires daily medical management by a physician with specialized training in physical medicine and rehabilitation for the following reasons: Direction of a multidisciplinary physical rehabilitation program to maximize functional independence : Yes Medical management of patient stability for increased activity during participation in an intensive rehabilitation regime.: Yes Analysis of laboratory values and/or radiology reports with any subsequent need for medication adjustment and/or medical intervention. : Yes   I attest that I was present, lead the team conference, and concur with the assessment and plan of the team.   Tennis Must 06/23/2021, 3:22 PM

## 2021-06-23 NOTE — Progress Notes (Signed)
Occupational Therapy Session Note  Patient Details  Name: Wayne Mendez MRN: [id number] Date of Birth: [birth year]  Today's Date: 06/23/2021 OT Individual Time: 2263-3354 OT Individual Time Calculation (min): 70 min    Short Term Goals: Week 1:  OT Short Term Goal 1 (Week 1): Pt will complete LB dressing at sit<stand level using RW during 2 consecutive ADL sessions OT Short Term Goal 2 (Week 1): Pt will complete 2 grooming tasks while standing at the sink using RW for standing support as needed OT Short Term Goal 3 (Week 1): Pt will complete 1/3 components of toileting with no more than Mod balance assistance  Skilled Therapeutic Interventions/Progress Updates:    Pt resting in bed upon arrival and ready to get OOB. Supine>sit EOB with supervision and mod verbal cues for sequencing and safety. Squat pivot transfer to w/c with min A. Bathing at shower level. Max verbal cues for sequencing, thoroughness, and safety. Bathing with min A. Dressing with sit<>stand at sink. Sit<>stand with min A. LB dressing with mod A. UB dressing with min A. Mod verbal cues for technique and sequencing. Pt transitioned to day room. RUE AAROM and AROM with UE skate. Minimal results. Max verbal cues for muscle isolation and postioinig. Pt returned to room and remained in w/c. Belt alarm activated. All needs within reach.   Therapy Documentation Precautions:  Precautions Precautions: Fall Precaution Comments: Rt hemi Restrictions Weight Bearing Restrictions: No   Pain:  Pt denies pain this afternoon   Therapy/Group: Individual Therapy  Rich Brave 06/23/2021, 9:30 AM

## 2021-06-24 DIAGNOSIS — I610 Nontraumatic intracerebral hemorrhage in hemisphere, subcortical: Secondary | ICD-10-CM | POA: Diagnosis not present

## 2021-06-24 NOTE — Progress Notes (Signed)
Patient ID: Wayne Mendez, male   DOB: May 31, 1966, 56 y.o.   MRN: [id number]  SW met with pt in room to provide updates from team conference, and d/c date 1/20. Pt aware SW already spoke with his wife to give updates. SW will continue to provide updates.   Loralee Pacas, MSW, [place] Office: [phone] Cell: [phone] Fax: [phone]

## 2021-06-24 NOTE — Progress Notes (Signed)
PROGRESS NOTE   Subjective/Complaints:  Wayne Mendez reports really tired last night, but slept the best since in hospital- almost 12 hours.  Wayne Mendez on toilet with steady to try and have BM.  Doesn't fele constipated yet.   Admits that therapy talked to him about impulsiveness and decreased safety awareness and moving so fast yesterday. Remembers.   ROS:   Wayne Mendez denies SOB, abd pain, CP, N/V/C/D, and vision changes   Objective:   No results found. Recent Labs    06/22/21 0541  WBC 7.2  HGB 15.0  HCT 45.0  PLT 186   Recent Labs    06/22/21 0541  NA 139  K 3.8  CL 102  CO2 27  GLUCOSE 112*  BUN 22*  CREATININE 1.12  CALCIUM 9.0    Intake/Output Summary (Last 24 hours) at 06/24/2021 0849 Last data filed at 06/24/2021 0717 Gross per 24 hour  Intake 360 ml  Output 999 ml  Net -639 ml        Physical Exam: Vital Signs Blood pressure 129/89, pulse 70, temperature 98.4 F (36.9 C), temperature source Oral, resp. rate 14, height 6' (1.829 m), SpO2 100 %.     General: awake, alert, appropriate, using steady to sit on toilet in bathroom- friend in room; NAD HENT: conjugate gaze; oropharynx moist CV: regular rate; no JVD Pulmonary: CTA B/L; no W/R/R- good air movement GI: soft, NT, ND, (+)BS Psychiatric: appropriate- less frustrated about speech Neurological: aphasic- but much more fluent speech- fewer word substitutions heard- and appeared to say what was trying to say more frequently.  Neurological: R arm kept flopping down into lap- not stay on steady arm.     Mental Status: He is alert.     Comments: Alert. Expressive language deficits, non-fluent.  Some receptive deficits as well, likely apraxic, too. Able to tell me where he is with verbal cueing and provide some basic biographical information. Right central 7 and tongue deviation. Right arm and leg 1/2 sensation for LT and PP. RUE trace deltoid but otherwise 0/5.  RLE  1/5 HE, HAD, trace to absent distally. DTR's trace. No resting tone. LUE and LLE 5/5 with normal sensation  Psychiatric:        Mood and Affect: Mood normal.        Behavior: Behavior normal.      Assessment/Plan: 1. Functional deficits which require 3+ hours per day of interdisciplinary therapy in a comprehensive inpatient rehab setting. Physiatrist is providing close team supervision and 24 hour management of active medical problems listed below. Physiatrist and rehab team continue to assess barriers to discharge/monitor patient progress toward functional and medical goals  Care Tool:  Bathing    Body parts bathed by patient: Right arm, Chest, Abdomen, Front perineal area, Left arm, Right upper leg, Left upper leg, Left lower leg, Face   Body parts bathed by helper: Buttocks, Right lower leg     Bathing assist Assist Level: Minimal Assistance - Patient > 75%     Upper Body Dressing/Undressing Upper body dressing   What is the patient wearing?: Pull over shirt    Upper body assist Assist Level: Minimal Assistance - Patient > 75%  Lower Body Dressing/Undressing Lower body dressing      What is the patient wearing?: Underwear/pull up, Pants     Lower body assist Assist for lower body dressing: Moderate Assistance - Patient 50 - 74%     Toileting Toileting Toileting Activity did not occur (Clothing management and hygiene only): N/A (no void or bm)  Toileting assist Assist for toileting: Total Assistance - Patient < 25% Assistive Device Comment: with urinal in bed   Transfers Chair/bed transfer  Transfers assist     Chair/bed transfer assist level: Moderate Assistance - Patient 50 - 74%     Locomotion Ambulation   Ambulation assist   Ambulation activity did not occur: Safety/medical concerns      Max distance: 30   Walk 10 feet activity   Assist  Walk 10 feet activity did not occur: Safety/medical concerns        Walk 50 feet  activity   Assist Walk 50 feet with 2 turns activity did not occur: Safety/medical concerns         Walk 150 feet activity   Assist Walk 150 feet activity did not occur: Safety/medical concerns         Walk 10 feet on uneven surface  activity   Assist Walk 10 feet on uneven surfaces activity did not occur: Safety/medical concerns         Wheelchair     Assist Is the patient using a wheelchair?: Yes Type of Wheelchair: Manual    Wheelchair assist level: Supervision/Verbal cueing Max wheelchair distance: 150    Wheelchair 50 feet with 2 turns activity    Assist        Assist Level: Supervision/Verbal cueing   Wheelchair 150 feet activity     Assist      Assist Level: Supervision/Verbal cueing   Blood pressure 129/89, pulse 70, temperature 98.4 F (36.9 C), temperature source Oral, resp. rate 14, height 6' (1.829 m), SpO2 100 %.    Medical Problem List and Plan: 1. 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  Impaired mobility: continue Heparin             -antiplatelet therapy: N/A 3. Pain Management: Tylenol as needed 4. Mood: Provide emotional support             -antipsychotic agents: N/A 5. Neuropsych: This patient is? capable of making decisions on his own behalf. 6. Skin/Wound Care: Routine skin checks 7. Fluids/Electrolytes/Nutrition: Routine in and outs with follow-up chemistries 8.  Hypertension.  Norvasc 10 mg daily, lisinopril 20 mg twice daily, increase lopressor to 100mg  BID.   1/3- BP controlled- con't regimen 9.  Hyperlipidemia.  Hold due to IPH.  -Consider low dose statin at  discharge 10. AKI: placed nursing order to encourage 6-8 glasses of water per day, resolved on repeat, continue to monitor on Mondays  1/2- BUN down slightly 22- will push fluids and recheck Thursday 11. Suboptimal potassium: supplement 04-16-2002 today and repeat K+ tomorrow  1/2-  K+ 3.8- will recheck Thursday to see if maintains 12. Insomnia: melatonin 5mg  and grounds pass ordered to help reset circadian rhythm. 1/2- will stop melatonin and try Trazodone 50 mg QHS- also ordered therapeutic pass.   1/3- slept so much better- con't regimen  1/4- sleeping great with Trazodone  Spoke to Wayne Mendez specifically about slowing down, having more safety in mind and  trying to avoid mistakes due to speed of completing tasks.  I spent a total of 26 minuteson total care today- >50% coordination of care prolonged d/w Wayne Mendez about speed/impulsiveness, etc.        LOS: 5 days A FACE TO FACE EVALUATION WAS PERFORMED  Elyzabeth Goatley 06/24/2021, 8:49 AM

## 2021-06-24 NOTE — Progress Notes (Signed)
Speech Language Pathology Daily Session Note  Patient Details  Name: Wayne Mendez MRN: [id number] Date of Birth: 26-Mar-1966  Today's Date: 06/24/2021 SLP Individual Time: 0900-1000 SLP Individual Time Calculation (min): 60 min  Short Term Goals: Week 1: SLP Short Term Goal 1 (Week 1): Patient will tolerate regular texture solids, thin liquids diet without significant amount of overt s/s aspiration or penetration with mod I. SLP Short Term Goal 2 (Week 1): Patient will name at least 10 items in a given category with modA verbal, semantic cues. SLP Short Term Goal 3 (Week 1): Patient will participate in further assessment of more complex level (sentence, paragraph) reading comprehension. SLP Short Term Goal 4 (Week 1): Patient will demonstrate awareness to semantic and phonemic errors during structured language tasks, with modA verbal cues. SLP Short Term Goal 5 (Week 1): Patient will respond to open-ended questions utilizing word-level communication board and/or learned word-finding strategies, with minA verbal cues. SLP Short Term Goal 6 (Week 1): Patient will be able to summarize after SLP reads aloud short 3-4 sentence paragraph, with 80% accuracy and minA verbal cues.  Skilled Therapeutic Interventions:   Patient seen for skilled ST session focusing on aphasia goals. Patient sitting up in [facility] and enthusiastic about working with ST. He completed sentence level reading comprehension task to find sentence that describe actions in pictures and was 65% accurate without cues as he appeared to be reading too quickly and missing key words. With SLP providing cues for attention to all words in sentence, he improved to 90% accuracy. He named 5 items in a given category with mod-maxA verbal and visual (picture/drawings) cues. He appeared to do best with line drawings. Patient did demonstrate awareness to difficulties and asked SLP why sometimes he can just name easily and other times he struggles  significantly. Patient was able to complete task of elimination of object words that did not correspond to given category and was 75% accurate without cues. Patient reported that he enjoyed session and that he will keep working on focusing and not working too quickly. He was left in room in [facility] with alarms activated. He continues to benefit from skilled SLP treatment to maximize language and cognitive function prior to discharge.  Pain Pain Assessment Pain Scale: 0-10 Pain Score: 0-No pain  Therapy/Group: Individual Therapy   Angela Nevin, MA, CCC-SLP Speech Therapy

## 2021-06-24 NOTE — Progress Notes (Signed)
Physical Therapy Session Note  Patient Details  Name: Wayne Mendez MRN: [id number] Date of Birth: 07-20-1965  Today's Date: 06/24/2021 PT Individual Time: 0800-0828 PT Individual Time Calculation (min): 28 min   Short Term Goals: Week 1:  PT Short Term Goal 1 (Week 1): Pt will transfer to and from [facility] with min assist PT Short Term Goal 2 (Week 1): Pt will ambulate 64ft with mod assist and LRAD PT Short Term Goal 3 (Week 1): Pt will propell WC 188ft with supevision assist PT Short Term Goal 4 (Week 1): pt will perform bed mobility with min assist 50% of trials  Skilled Therapeutic Interventions/Progress Updates:    Pt received in w/c and agreeable to therapy with no c/o pain. Pt propelled w/c with LUE and LLE to day room for endurance and functional mobility. Pt directed in Sit to stand and weight shifting with RW and assist for knee block and to facilitate hip extension. Worked on improving proprioception during weight shifts as no visual feedback source was available in gym. Pt then returned to room to use mirror in room and stood in same manner. Pt directed in squats 2 x 5 and weight shifting with visual feed back, demoing improved body awareness and ability to compensate. Pt demoed motor impersistence and fatigue on second set of squats. Discussed fatigue with CVA and that his experience is normal. Pt expressed understanding and remained in w/c at end of session with lap tray in place. Pt was left with all needs in reach and alarm active.    Therapy Documentation Precautions:  Precautions Precautions: Fall Precaution Comments: Rt hemi Restrictions Weight Bearing Restrictions: No   Therapy/Group: Individual Therapy  Juluis Rainier 06/24/2021, 1:46 PM

## 2021-06-24 NOTE — Progress Notes (Signed)
Occupational Therapy Session Note  Patient Details  Name: Wayne Mendez MRN: [id number] Date of Birth: July 15, 1965  Today's Date: 06/24/2021 OT Individual Time: 1100-1155 OT Individual Time Calculation (min): 55 min    Short Term Goals: Week 1:  OT Short Term Goal 1 (Week 1): Pt will complete LB dressing at sit<stand level using RW during 2 consecutive ADL sessions OT Short Term Goal 2 (Week 1): Pt will complete 2 grooming tasks while standing at the sink using RW for standing support as needed OT Short Term Goal 3 (Week 1): Pt will complete 1/3 components of toileting with no more than Mod balance assistance  Skilled Therapeutic Interventions/Progress Updates:    Pt resting in w/c upon arrival. OT intervention with w/c mobility, functional tranfers, sitting balance, RUE NMR, and safety awareness to increase independence with BADLs. W/c mobility with min verbal cues for safety. Squat pivot transfers with min A and max verbal cues for safety awareness. Sit<>supine on therapy mat with min A and max verbal cues for technique and safety awareness. Visual feedback provided for sitting balance. RUE NMR including weight bearing and NMES. Pt propelled w/c back to room and remained in w/c. Belt alarm activated. All needs within reach.  1:1 NMES applied to biceps brachii  Ratio 1:3 Rate 35 pps Waveform- Asymmetric Ramp 1.0 Pulse 300 Intensity- 19 Duration -   10 mins  Increase in muscle contraction noted but only trace movement  No adverse reactions after treatment and is skin intact.   1:1 NMES applied RUE wrist/finger flexors  Ratio 1:3 Rate 35 pps Waveform- Asymmetric Ramp 1.0 Pulse 300 Intensity- 21 Duration -   10  Trace wrist and finger flexion  No adverse reactions after treatment and is skin intact.    Therapy Documentation Precautions:  Precautions Precautions: Fall Precaution Comments: Rt hemi Restrictions Weight Bearing Restrictions: No Pain: Pain Assessment Pain  Scale: 0-10 Pain Score: 0-No pain     Therapy/Group: Individual Therapy  Rich Brave 06/24/2021, 12:16 PM

## 2021-06-24 NOTE — Progress Notes (Signed)
Physical Therapy Session Note  Patient Details  Name: Wayne Mendez MRN: [id number] Date of Birth: [birth year]  Today's Date: 06/24/2021 PT Individual Time: 2671-2458 PT Individual Time Calculation (min): 70 min   Short Term Goals: Week 1:  PT Short Term Goal 1 (Week 1): Pt will transfer to and from [facility] with min assist PT Short Term Goal 2 (Week 1): Pt will ambulate 9ft with mod assist and LRAD PT Short Term Goal 3 (Week 1): Pt will propell WC 126ft with supevision assist PT Short Term Goal 4 (Week 1): pt will perform bed mobility with min assist 50% of trials  Skilled Therapeutic Interventions/Progress Updates:    Pt received seated in w/c in room, agreeable to PT session. No complaints of pain. Manual w/c propulsion to/from therapy gym at Supervision level with cues to attend to R visual field due to inattention and neglect. Pt does run into several obstacles with RLE during w/c propulsion. Sit to stand with min to mod A to L handrail in hallway. ACE-wrapped R ankle into DF for gait training. Ambulation x 30 ft, x 20 ft with L handrail in hallway with mod to max A for balance, advancement of RLE, and close w/c follow for safety. Utilized [employer] for visual feedback for upright posture and weight shift onto RLE. With onset of fatigue pt exhibits increase in trunk flexion and buckling of R limb in stance. Pt exhibits improved ability to listen to cues this date with decreased impulsivity. Squat pivot transfer to/from mat table with min A. Sit to supine min A for RLE management. Performed NMES level 30 to R quads x 10 min with trace muscle activation noted. Pt unable to exhibit any active movement of limb but does exhibit muscle activation with use of NMES. Attempted NMES to R tibialis anterior, no muscle twitch noted even with level 30, deferred treatment of this muscle. Inspected skin following NMES with no redness or adverse effects noted. Pt left seated in w/c in room with needs in reach, quick release  belt and chair alarm in place at end of session.  Therapy Documentation Precautions:  Precautions Precautions: Fall Precaution Comments: Rt hemi Restrictions Weight Bearing Restrictions: No      Therapy/Group: Individual Therapy   Peter Congo, PT, DPT, CSRS  06/24/2021, 5:50 PM

## 2021-06-25 MED ORDER — ENOXAPARIN SODIUM 40 MG/0.4ML IJ SOSY
40.0000 mg | PREFILLED_SYRINGE | INTRAMUSCULAR | Status: DC
  Administered 2021-06-25 – 2021-07-10 (×16): 40 mg via SUBCUTANEOUS
  Filled 2021-06-25 (×16): qty 0.4

## 2021-06-25 NOTE — Progress Notes (Signed)
Occupational Therapy Weekly Progress Note ° °Patient Details  °Name: Wayne Mendez °MRN: [id number] °Date of Birth: 05/26/1966 ° °Beginning of progress report period: June 20, 2021 °End of progress report period: June 25, 2021 ° °Patient has met 1 of 3 short term goals.  Pt is making steady progress with BADLs and functional transfers. Pt requires max verbal cues for safety and attention to Rt during transfers and BADLs. Squat pivot transfers with min A. Bathing with min A. UB/LB dressing with min A and max verbal cues for sequencing. Minimal shoulder flexion noted. Toileting with max A. Family has not been present during therapy sessions. Will continue to benefit from CIR level OT at this time to progress to supervision level goals.   ° °Patient continues to demonstrate the following deficits: muscle weakness and muscle paralysis, impaired timing and sequencing, unbalanced muscle activation, and decreased coordination, decreased attention to right and decreased motor planning, decreased awareness, decreased problem solving, decreased memory, and delayed processing, and decreased sitting balance, decreased standing balance, decreased postural control, hemiplegia, and decreased balance strategies and therefore will continue to benefit from skilled OT intervention to enhance overall performance with BADL and Reduce care partner burden. ° °Patient progressing toward long term goals..  Continue plan of care. ° °OT Short Term Goals °Week 1:  OT Short Term Goal 1 (Week 1): Pt will complete LB dressing at sit<stand level using RW during 2 consecutive ADL sessions °OT Short Term Goal 1 - Progress (Week 1): Met °OT Short Term Goal 2 (Week 1): Pt will complete 2 grooming tasks while standing at the sink using RW for standing support as needed °OT Short Term Goal 2 - Progress (Week 1): Progressing toward goal °OT Short Term Goal 3 (Week 1): Pt will complete 1/3 components of toileting with no more than Mod balance  assistance °OT Short Term Goal 3 - Progress (Week 1): Progressing toward goal °Week 2:  OT Short Term Goal 1 (Week 2): Pt will complete 1/3 components of toileting with no more than Mod balance assistance °OT Short Term Goal 2 (Week 2): Pt will complete 2 grooming tasks while standing at the sink using RW for standing support as needed °OT Short Term Goal 3 (Week 2): Pt will perform toilet tranfser with min A and min verbal cues for sequencing and safety ° ° °Lanier, Thomas Chappell °06/25/2021, 12:31 PM  ° °James McGuire  OTR/L °

## 2021-06-25 NOTE — Progress Notes (Signed)
PROGRESS NOTE   Subjective/Complaints:  Pt reports shower COLD this AM- won't warm up faster.  Was moved rooms per nursing issues.   Bowels regular.  Spoke to wife and friend- pt also in afternoon getting exhausted and feels "gray".  I explained he needs a brain break- which is commonly needed in CVA pts.     ROS:   Pt denies SOB, abd pain, CP, N/V/C/D, and vision changes   Objective:   No results found. No results for input(s): WBC, HGB, HCT, PLT in the last 72 hours.  No results for input(s): NA, K, CL, CO2, GLUCOSE, BUN, CREATININE, CALCIUM in the last 72 hours.   Intake/Output Summary (Last 24 hours) at 06/25/2021 0816 Last data filed at 06/25/2021 0539 Gross per 24 hour  Intake 480 ml  Output 575 ml  Net -95 ml        Physical Exam: Vital Signs Blood pressure 114/77, pulse 64, temperature 97.9 F (36.6 C), resp. rate 18, height 6' (1.829 m), SpO2 98 %.      General: awake, alert, appropriate, sitting in shower trying to warm it up; OTA with pt; NAD HENT: conjugate gaze; oropharynx moist CV: regular rate; no JVD Pulmonary: CTA B/L; no W/R/R- good air movement GI: soft, NT, ND, (+)BS Psychiatric: appropriate Neurological: aphasic- stable-improved fluency  Neurological: R arm kept flopping down into lap- kept putting on lap    Mental Status: He is alert.     Comments: Alert. Expressive language deficits, non-fluent.  Some receptive deficits as well, likely apraxic, too. Able to tell me where he is with verbal cueing and provide some basic biographical information. Right central 7 and tongue deviation. Right arm and leg 1/2 sensation for LT and PP. RUE trace deltoid but otherwise 0/5.  RLE 1/5 HE, HAD, trace to absent distally. DTR's trace. No resting tone. LUE and LLE 5/5 with normal sensation  Psychiatric:        Mood and Affect: Mood normal.        Behavior: Behavior normal.       Assessment/Plan: 1. Functional deficits which require 3+ hours per day of interdisciplinary therapy in a comprehensive inpatient rehab setting. Physiatrist is providing close team supervision and 24 hour management of active medical problems listed below. Physiatrist and rehab team continue to assess barriers to discharge/monitor patient progress toward functional and medical goals  Care Tool:  Bathing    Body parts bathed by patient: Right arm, Left arm, Chest, Abdomen, Front perineal area, Right upper leg, Left upper leg, Left lower leg, Face   Body parts bathed by helper: Buttocks, Right lower leg     Bathing assist Assist Level: Minimal Assistance - Patient > 75%     Upper Body Dressing/Undressing Upper body dressing   What is the patient wearing?: Pull over shirt    Upper body assist Assist Level: Minimal Assistance - Patient > 75%    Lower Body Dressing/Undressing Lower body dressing      What is the patient wearing?: Underwear/pull up, Pants     Lower body assist Assist for lower body dressing: Minimal Assistance - Patient > 75%     Naval architect  Activity did not occur (Clothing management and hygiene only): N/A (no void or bm)  Toileting assist Assist for toileting: Total Assistance - Patient < 25% Assistive Device Comment: with urinal in bed   Transfers Chair/bed transfer  Transfers assist     Chair/bed transfer assist level: Minimal Assistance - Patient > 75%     Locomotion Ambulation   Ambulation assist   Ambulation activity did not occur: Safety/medical concerns  Assist level: 2 helpers Assistive device: Other (comment) (L handrail in hallway) Max distance: 30   Walk 10 feet activity   Assist  Walk 10 feet activity did not occur: Safety/medical concerns  Assist level: 2 helpers Assistive device: Other (comment) (L handrail in hallway)   Walk 50 feet activity   Assist Walk 50 feet with 2 turns activity did not  occur: Safety/medical concerns         Walk 150 feet activity   Assist Walk 150 feet activity did not occur: Safety/medical concerns         Walk 10 feet on uneven surface  activity   Assist Walk 10 feet on uneven surfaces activity did not occur: Safety/medical concerns         Wheelchair     Assist Is the patient using a wheelchair?: Yes Type of Wheelchair: Manual    Wheelchair assist level: Supervision/Verbal cueing Max wheelchair distance: 150    Wheelchair 50 feet with 2 turns activity    Assist        Assist Level: Supervision/Verbal cueing   Wheelchair 150 feet activity     Assist      Assist Level: Supervision/Verbal cueing   Blood pressure 114/77, pulse 64, temperature 97.9 F (36.6 C), resp. rate 18, height 6' (1.829 m), SpO2 98 %.    Medical Problem List and Plan: 1. 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  2.  Impaired mobility: continue Heparin  1/5- since been a week, will switch to Lovenox.              -antiplatelet therapy: N/A 3. Pain Management: Tylenol as needed 4. Mood: Provide emotional support             -antipsychotic agents: N/A 5. Neuropsych: This patient is? capable of making decisions on his own behalf. 6. Skin/Wound Care: Routine skin checks 7. Fluids/Electrolytes/Nutrition: Routine in and outs with follow-up chemistries 8.  Hypertension.  Norvasc 10 mg daily, lisinopril 20 mg twice daily, increase lopressor to 100mg   BID.   1/5- BP controlled- con't regimen 9.  Hyperlipidemia.  Hold due to IPH.  -Consider low dose statin at discharge 10. AKI: placed nursing order to encourage 6-8 glasses of water per day, resolved on repeat, continue to monitor on Mondays  1/2- BUN down slightly 22- will push fluids and recheck Thursday 11. Suboptimal potassium: supplement  51meq today and repeat K+ tomorrow  1/2- K+ 3.8- will recheck Thursday to see if maintains  1/5- labs pending- will monitor 12. Insomnia: melatonin 5mg  and grounds pass ordered to help reset circadian rhythm. 1/2- will stop melatonin and try Trazodone 50 mg QHS- also ordered therapeutic pass.   1/3- slept so much better- con't regimen  1/4- sleeping great with Trazodone    I spent a total of 35 minutes on total care- >50% coordination of care- speaking with PA, family about brain breaks and assessing labs.         LOS: 6 days A FACE TO FACE EVALUATION WAS PERFORMED  Chinmay Squier 06/25/2021, 8:16 AM

## 2021-06-25 NOTE — Progress Notes (Signed)
Occupational Therapy Session Note  Patient Details  Name: Yuma Pacella MRN: [id number] Date of Birth: 1966/06/11  Today's Date: 06/26/2021 OT Individual Time: 1015-1110 OT Individual Time Calculation (min): 55 min   Skilled Therapeutic Interventions/Progress Updates:    Pt greeted in the w/c with no c/o pain. ADL needs reportedly met. He was motivated to go outside. Escorted him via w/c to the outdoor patio. Pt unable to feel the warmth of the sun with the Rt UE. First focused on AAROM of the Rt UE shoulder flexion/extension and internal/external rotation with gravity minimized, max cues to decrease truncal compensatory strategies while participating. Transitioned to midline orientation and midline awareness during sit<stands and static stands with RW. Worked extensively on increasing Rt sided weightbearing and using visual cues to initiate changes in postural alignment to improve balance. Rt knee had to be supported due to buckling. Vcs for Rt attention and joint protection when managing his Rt UE in/out of RW orthosis. Min-Mod balance assistance overall during stands. Pt responded well to questioning cues starting from the "bottom up" checking alignment of feet, hips, and trunk in stance. He was then returned to the room via w/c, left sitting up with all needs within reach and safety belt fastened. Tx focus placed on sit<stands and standing balance in prep for carryover during self care tasks.   Therapy Documentation Precautions:  Precautions Precautions: Fall Precaution Comments: Rt hemi Restrictions Weight Bearing Restrictions: No Vital Signs: Therapy Vitals Temp: 98.6 F (37 C) Temp Source: Oral Pulse Rate: 72 Resp: 16 BP: 117/86 Patient Position (if appropriate): Sitting Oxygen Therapy SpO2: 99 % O2 Device: Room Air Pain:   ADL: ADL Eating: Set up Grooming: Moderate assistance Where Assessed-Grooming: Edge of bed Upper Body Bathing: Moderate assistance Where Assessed-Upper  Body Bathing: Edge of bed Lower Body Bathing: Maximal assistance Where Assessed-Lower Body Bathing: Edge of bed Upper Body Dressing: Minimal assistance Where Assessed-Upper Body Dressing: Chair Lower Body Dressing: Maximal assistance Where Assessed-Lower Body Dressing: Edge of bed Toileting: Not assessed Toilet Transfer: Dependent (using Stedy, pt 50%) Toilet Transfer Method: Stand pivot Science writer: Grab bars Tub/Shower Transfer: Not assessed ADL Comments: All sit<stands and functional transfers completed using Stedy   Therapy/Group: Individual Therapy  Mossie Gilder A Jamiria Langill 06/26/2021, 4:07 PM

## 2021-06-25 NOTE — Progress Notes (Signed)
Speech Language Pathology Daily Session Note  Patient Details  Name: Wayne Mendez MRN: [id number] Date of Birth: December 15, 1965  Today's Date: 06/25/2021 SLP Individual Time: 1100-1200 SLP Individual Time Calculation (min): 60 min  Short Term Goals: Week 1: SLP Short Term Goal 1 (Week 1): Patient will tolerate regular texture solids, thin liquids diet without significant amount of overt s/s aspiration or penetration with mod I. SLP Short Term Goal 2 (Week 1): Patient will name at least 10 items in a given category with modA verbal, semantic cues. SLP Short Term Goal 3 (Week 1): Patient will participate in further assessment of more complex level (sentence, paragraph) reading comprehension. SLP Short Term Goal 4 (Week 1): Patient will demonstrate awareness to semantic and phonemic errors during structured language tasks, with modA verbal cues. SLP Short Term Goal 5 (Week 1): Patient will respond to open-ended questions utilizing word-level communication board and/or learned word-finding strategies, with minA verbal cues. SLP Short Term Goal 6 (Week 1): Patient will be able to summarize after SLP reads aloud short 3-4 sentence paragraph, with 80% accuracy and minA verbal cues.  Skilled Therapeutic Interventions:   Patient seen for skilled ST session focusing on aphasia goals. He pointed to words in field of three to match to line drawing picture and was 100% accurate, pointed to sentences in field of two to match to picture actions and was 70% accurate without cues. He did demonstrate significantly improved accuracy with oral reading at sentence level as well as improved awareness to errors. He was able to name 5 different sports with minA level of verbal cues. SLP then introduced task of using words organized into categories such as 'foods', 'drinks' and patient was able to use to indicate likes/dislikes. When SLP added deserts, patient had difficult time trying to describe a particular item and SLP  encouraged him to draw. He drew a circle and put dots on it. SLP then started writing names of cookies and patient able to indicate by pointing and naming when the item (chocolate chip cookie) was written in list. Patient continues to work very hard and enjoy therapy. He was brought back to room in [facility] and with all needs within reach. He continues to benefit from skilled SLP intervention to maximize cognitive-linguistic function prior to discharge.  Pain Pain Assessment Pain Scale: Faces Pain Score: 0-No pain  Therapy/Group: Individual Therapy  Pablo Lawrence 06/25/2021, 5:07 PM

## 2021-06-25 NOTE — Progress Notes (Signed)
Physical Therapy Session Note  Patient Details  Name: Wayne Mendez MRN: [id number] Date of Birth: 1966/03/18  Today's Date: 06/25/2021 PT Individual Time: 2620-3559 PT Individual Time Calculation (min): 63 min   Short Term Goals: Week 1:  PT Short Term Goal 1 (Week 1): Pt will transfer to and from [facility] with min assist PT Short Term Goal 2 (Week 1): Pt will ambulate 51ft with mod assist and LRAD PT Short Term Goal 3 (Week 1): Pt will propell WC 172ft with supevision assist PT Short Term Goal 4 (Week 1): pt will perform bed mobility with min assist 50% of trials  Skilled Therapeutic Interventions/Progress Updates:    pt received in w/c with no c/o pain and agreeable to therapy. Pt propelled w/c with LUE and LLE for endurance and functional mobility. Session focused on weight bearing in //bars for improved RLE activation and midline orientation. Rest breaks with education on intervention strategy and progression. Visual feedback both laterally and anteriorly utilized with immediate improvement of midline orientation. Pt was directed in pre gait activities as follows: -lateral weight shift -stepping LLE laterally -stepping LLE forward/back -stepping RLE forward/back with assist to move leg. Therapist provided RLE knee block and facilitated both weight shifting and hip extension at times. Frequent multimodal cueing required to return to midline and shift weight onto RLE. Pt returned to room in the same manner and was left in w/c with all needs in reach and alarm active.   Therapy Documentation Precautions:  Precautions Precautions: Fall Precaution Comments: Rt hemi Restrictions Weight Bearing Restrictions: No     Therapy/Group: Individual Therapy  Juluis Rainier 06/25/2021, 2:34 PM

## 2021-06-25 NOTE — Progress Notes (Signed)
Speech Language Pathology Daily Session Note  Patient Details  Name: Wayne Mendez MRN: [id number] Date of Birth: Nov 08, 1965  Today's Date: 06/25/2021 SLP Individual Time: 1500-1530 SLP Individual Time Calculation (min): 30 min  Short Term Goals: Week 1: SLP Short Term Goal 1 (Week 1): Patient will tolerate regular texture solids, thin liquids diet without significant amount of overt s/s aspiration or penetration with mod I. SLP Short Term Goal 2 (Week 1): Patient will name at least 10 items in a given category with modA verbal, semantic cues. SLP Short Term Goal 3 (Week 1): Patient will participate in further assessment of more complex level (sentence, paragraph) reading comprehension. SLP Short Term Goal 4 (Week 1): Patient will demonstrate awareness to semantic and phonemic errors during structured language tasks, with modA verbal cues. SLP Short Term Goal 5 (Week 1): Patient will respond to open-ended questions utilizing word-level communication board and/or learned word-finding strategies, with minA verbal cues. SLP Short Term Goal 6 (Week 1): Patient will be able to summarize after SLP reads aloud short 3-4 sentence paragraph, with 80% accuracy and minA verbal cues.  Skilled Therapeutic Interventions:   Patient seen for another session of skilled ST focusing again on aphasia goals. SLP presented patient with some exercises he could work on outside of therapy. We reviewed one exercise which was new and involved determining word in field of three that fit with definition/description. Patient initially had a lot of difficulty and required mod-maxA cues but when SLP cued him to find key words in phrase, he then improved with accuracy. SLP then introduced task of writing down items in categories for use when patient is having difficulty communicating wants/needs etc with others. Patient able to use hospital food menu to name food items in categories of 'drinks', 'sides', fruits and veggies', 'main  dish' with SLP acting as scribe. Patient demonstrated good awareness and self-monitoring when he was naming an item that needed to go in a different category. Plan is to continue this task and determine benefit for patient. He was left with friend in room and all needs within reach. He continues to benefit from skilled SLP intervention to maximize cognitive-linguistic function prior to discharge.  Pain Pain Assessment Pain Scale: 0-10 Pain Score: 0-No pain Faces Pain Scale: No hurt  Therapy/Group: Individual Therapy  Sonia Baller, MA, CCC-SLP Speech Therapy

## 2021-06-25 NOTE — Progress Notes (Signed)
Occupational Therapy Session Note  Patient Details  Name: Wayne Mendez MRN: [id number] Date of Birth: January 21, 1966  Today's Date: 06/25/2021 OT Individual Time: 9379-0240 OT Individual Time Calculation (min): 30 min    Short Term Goals: Week 2:  OT Short Term Goal 1 (Week 2): Pt will complete 1/3 components of toileting with no more than Mod balance assistance OT Short Term Goal 2 (Week 2): Pt will complete 2 grooming tasks while standing at the sink using RW for standing support as needed OT Short Term Goal 3 (Week 2): Pt will perform toilet tranfser with min A and min verbal cues for sequencing and safety  Skilled Therapeutic Interventions/Progress Updates:    Pt in wheelchair to start with focus on session on neuromuscular re-education.  Had him work on sit to stand transitions at the sink with emphasis on setup before standing and maintaining increased midline orientation for increased weightbearing through the RLE.  He tends to be impulsive and try to stand before scooting to the EOC or before positioning the RLE under him.  Worked on improving this and slowing him down as to increase activation of the RLE.  Mod assist for sit to stand without LUE support.  Once standing, placed the RUE on the sink while having pt flex trunk to increase weightbearing.  Max facilitation to maintain right elbow extension while reaching up to touch the wall and sink briefly.  Pt tends to try and compensate by moving his LLE forward as well as leaning his body on the front of the sink.  Pt with some receptive difficulties noted as when instructed to reach for certain spots, he would reach to another area.  Finished session with pt left in the wheelchair at end of session with safety alarm belt in place and with the call button and phone in reach.  SLP coming in for next session.    Therapy Documentation Precautions:  Precautions Precautions: Fall Precaution Comments: Rt hemi Restrictions Weight Bearing  Restrictions: No  Pain: Pain Assessment Pain Scale: Faces Pain Score: 3    Therapy/Group: Individual Therapy  Sabena Winner OTR/L 06/25/2021, 12:48 PM

## 2021-06-25 NOTE — Progress Notes (Signed)
Occupational Therapy Session Note  Patient Details  Name: Wayne Mendez MRN: [id number] Date of Birth: 1965-12-03  Today's Date: 06/25/2021 OT Individual Time: 0700-0757 OT Individual Time Calculation (min): 57 min    Short Term Goals: Week 1:  OT Short Term Goal 1 (Week 1): Pt will complete LB dressing at sit<stand level using RW during 2 consecutive ADL sessions OT Short Term Goal 2 (Week 1): Pt will complete 2 grooming tasks while standing at the sink using RW for standing support as needed OT Short Term Goal 3 (Week 1): Pt will complete 1/3 components of toileting with no more than Mod balance assistance  Skilled Therapeutic Interventions/Progress Updates:   Pt resting in bed upon arrival. OT intervention with focus on bed mobility, squat pivot tranfsers, standing balance, BADL retraining (bathing at shower level and dressing with sit<>stand from w/c at sink), safety awareness, and R inattention. Supine>sit EOB with min A. Squat pivot transfer bed>w/c with min A and mod verbal cues for safety. W/c<>TTB with min A and max verbal cues. Bathing at min A with max cues to attend to Rt and thoroughness. LB dressing with min A for standing balance and assistance for fastening shoes. UB dressing with min A and max verbal cues for technique. Grooming at sink (brushing teeth and shaving with electric razor) with supervision. Pt impulsive throughout session but responds well to verbal cues. Pt remained in w/c with belt alarm activated, half lap tray in place, and NT present.   Therapy Documentation Precautions:  Precautions Precautions: Fall Precaution Comments: Rt hemi Restrictions Weight Bearing Restrictions: No Pain:  Pt denies pain this morning   Therapy/Group: Individual Therapy  Rich Brave 06/25/2021, 8:00 AM

## 2021-06-26 DIAGNOSIS — R4701 Aphasia: Secondary | ICD-10-CM

## 2021-06-26 NOTE — Progress Notes (Signed)
Occupational Therapy Session Note  Patient Details  Name: Wayne Mendez MRN: [id number] Date of Birth: 27-Mar-1966  Today's Date: 06/26/2021 OT Individual Time: 4268-3419 OT Individual Time Calculation (min): 43 min    Short Term Goals: Week 2:  OT Short Term Goal 1 (Week 2): Pt will complete 1/3 components of toileting with no more than Mod balance assistance OT Short Term Goal 2 (Week 2): Pt will complete 2 grooming tasks while standing at the sink using RW for standing support as needed OT Short Term Goal 3 (Week 2): Pt will perform toilet tranfser with min A and min verbal cues for sequencing and safety  Skilled Therapeutic Interventions/Progress Updates:  Skilled OT intervention completed with focus on NMR of RUE, dynamic sitting balance, functional transfers, and mass practice sit <> stands. Pt received seated in w/c, with visitor in room, pt agreeable to session. Pt self-propelled with LUE and LLE in w/c with supervision <> gym, with pt demonstrating inattention on R side with cues needed to prevent bumping into items. Pt mildly impulsive during session, with pt presumably acting swiftly due to previous independence with pt able to express that it is hard for him to slow things down to think through prior. Pt squat pivot/lateral scoot with min A, with pt initiating transfer prior to therapist assisting with BLE positioning resulting in legs being twisted. Education provided on importance of setting up prior to transfer for safety and to prevent entanglement. While seated EOM, pt participating in dynamic reachingR NMR with R hand on mat and min A provided at pt elbow to assist with weight bearing during functional cross body reaching with bean bag/cornhole activity. Pt with good postural control and midline orientation during task however, min awareness of where R foot placement is on floor with cues needed for setting up wide BOS for balance. Pt squat pivot to w/c with min A, with better sequencing  demonstrated however pt pulling on R hand to lift, with education provided to pt about lifting flaccid arm with proximal support to promote integrity of wrist/digits. Pt facing bed rail, completed 5 functional sit <> stands to prep for ADL task with min-mod A for powering up, however mod-max A for standing balance with L lean and R knee flexion noted. Cues needed for weight shifting as well as slowing the process down to promote safety/control. Pt expressing how his body feels weird in this state and feels really off center, with therapist educating pt on importance of weight shifting to promote awareness and weight on RLE, as well as how the brain heals after stroke. Pt left seated in w/c, with arm tray on w/c, belt alarm on and all needs in reach at end of session.   Therapy Documentation Precautions:  Precautions Precautions: Fall Precaution Comments: Rt hemi Restrictions Weight Bearing Restrictions: No  Pain: No c/o pain   Therapy/Group: Individual Therapy  Conni Knighton E Lavoris Sparling 06/26/2021, 7:51 AM

## 2021-06-26 NOTE — Plan of Care (Signed)
°  Problem: Consults °Goal: RH STROKE PATIENT EDUCATION °Description: See Patient Education module for education specifics  °Outcome: Progressing °  °Problem: RH SKIN INTEGRITY °Goal: RH STG MAINTAIN SKIN INTEGRITY WITH ASSISTANCE °Description: STG Maintain Skin Integrity With Mod I Assistance. °Outcome: Progressing °  °Problem: RH SAFETY °Goal: RH STG ADHERE TO SAFETY PRECAUTIONS W/ASSISTANCE/DEVICE °Description: STG Adhere to Safety Precautions With Cues and Reminders. °Outcome: Progressing °Goal: RH STG DECREASED RISK OF FALL WITH ASSISTANCE °Description: STG Decreased Risk of Fall With Mod I Assistance. °Outcome: Progressing °  °Problem: RH KNOWLEDGE DEFICIT °Goal: RH STG INCREASE KNOWLEDGE OF HYPERTENSION °Description: Patient will demonstrate knowledge of HTN medications, dietary recommendations, and BP management with educational materials and handouts provided by staff independently at discharge. °Outcome: Progressing °  °

## 2021-06-26 NOTE — Progress Notes (Signed)
PROGRESS NOTE   Subjective/Complaints:  Pt reports slept well for 3rd day in a row.  Feels "great" this AM.  LBM this Am.  Mild HA after all day therapy- tylenol helps.  Turned off TV all day yesterday (hard for him) to give self brain breaks- didn't take additional brain breaks- but did go outside and relax in weather for 1 hour yesterday.    ROS:   Pt denies SOB, abd pain, CP, N/V/C/D, and vision changes    Objective:   No results found. No results for input(s): WBC, HGB, HCT, PLT in the last 72 hours.  No results for input(s): NA, K, CL, CO2, GLUCOSE, BUN, CREATININE, CALCIUM in the last 72 hours.   Intake/Output Summary (Last 24 hours) at 06/26/2021 0811 Last data filed at 06/25/2021 2000 Gross per 24 hour  Intake 720 ml  Output --  Net 720 ml        Physical Exam: Vital Signs Blood pressure 129/85, pulse 69, temperature 98.3 F (36.8 C), temperature source Oral, resp. rate 17, height 6' (1.829 m), SpO2 98 %.       General: awake, alert, appropriate, sitting up in bed; friend at bedside; NAD HENT: conjugate gaze; oropharynx moist CV: regular rate; no JVD Pulmonary: CTA B/L; no W/R/R- good air movement GI: soft, NT, ND, (+)BS Psychiatric: appropriate Neurological: aphasic- more word searching this AM, less word substitutions- couldn't think of cheerios name- R arm tucked under tray- slightly less flopping No hoffman's in RUE, but does have a few beats clonus in RLE- no increased tone at this time    Mental Status: He is alert.     Comments: Alert. Expressive language deficits, non-fluent.  Some receptive deficits as well, likely apraxic, too. Able to tell me where he is with verbal cueing and provide some basic biographical information. Right central 7 and tongue deviation. Right arm and leg 1/2 sensation for LT and PP. RUE trace deltoid but otherwise 0/5.  RLE 1/5 HE, HAD, trace to absent distally. DTR's  trace. No resting tone. LUE and LLE 5/5 with normal sensation  Psychiatric:        Mood and Affect: Mood normal.        Behavior: Behavior normal.      Assessment/Plan: 1. Functional deficits which require 3+ hours per day of interdisciplinary therapy in a comprehensive inpatient rehab setting. Physiatrist is providing close team supervision and 24 hour management of active medical problems listed below. Physiatrist and rehab team continue to assess barriers to discharge/monitor patient progress toward functional and medical goals  Care Tool:  Bathing    Body parts bathed by patient: Right arm, Left arm, Chest, Abdomen, Front perineal area, Right upper leg, Left upper leg, Left lower leg, Face   Body parts bathed by helper: Buttocks, Right lower leg     Bathing assist Assist Level: Minimal Assistance - Patient > 75%     Upper Body Dressing/Undressing Upper body dressing   What is the patient wearing?: Pull over shirt    Upper body assist Assist Level: Minimal Assistance - Patient > 75%    Lower Body Dressing/Undressing Lower body dressing  What is the patient wearing?: Underwear/pull up, Pants     Lower body assist Assist for lower body dressing: Minimal Assistance - Patient > 75%     Toileting Toileting Toileting Activity did not occur (Clothing management and hygiene only): N/A (no void or bm)  Toileting assist Assist for toileting: Total Assistance - Patient < 25% Assistive Device Comment: with urinal in bed   Transfers Chair/bed transfer  Transfers assist     Chair/bed transfer assist level: Minimal Assistance - Patient > 75%     Locomotion Ambulation   Ambulation assist   Ambulation activity did not occur: Safety/medical concerns  Assist level: 2 helpers Assistive device: Other (comment) (L handrail in hallway) Max distance: 30   Walk 10 feet activity   Assist  Walk 10 feet activity did not occur: Safety/medical concerns  Assist level:  2 helpers Assistive device: Other (comment) (L handrail in hallway)   Walk 50 feet activity   Assist Walk 50 feet with 2 turns activity did not occur: Safety/medical concerns         Walk 150 feet activity   Assist Walk 150 feet activity did not occur: Safety/medical concerns         Walk 10 feet on uneven surface  activity   Assist Walk 10 feet on uneven surfaces activity did not occur: Safety/medical concerns         Wheelchair     Assist Is the patient using a wheelchair?: Yes Type of Wheelchair: Manual    Wheelchair assist level: Supervision/Verbal cueing Max wheelchair distance: 150    Wheelchair 50 feet with 2 turns activity    Assist        Assist Level: Supervision/Verbal cueing   Wheelchair 150 feet activity     Assist      Assist Level: Supervision/Verbal cueing   Blood pressure 129/85, pulse 69, temperature 98.3 F (36.8 C), temperature source Oral, resp. rate 17, height 6' (1.829 m), SpO2 98 %.    Medical Problem List and Plan: 1. 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  2.  Impaired mobility: continue Heparin  1/5- since been a week, will switch to Lovenox.              -antiplatelet therapy: N/A 3. Pain Management: Tylenol as needed 4. Mood: Provide emotional support             -antipsychotic agents: N/A 5. Neuropsych: This patient is? capable of making decisions on his own behalf. 6. Skin/Wound Care: Routine skin checks 7. Fluids/Electrolytes/Nutrition: Routine in and outs with follow-up chemistries 8.   Hypertension.  Norvasc 10 mg daily, lisinopril 20 mg twice daily, increase lopressor to 100mg  BID.   1/5- BP controlled- con't regimen 9.  Hyperlipidemia.  Hold due to IPH.  -Consider low dose statin at discharge 10. AKI: placed nursing order to encourage 6-8 glasses of water per day, resolved on repeat, continue  to monitor on Mondays  1/6- will recheck Monday-con't to push fluids PO  11. Suboptimal potassium: supplement 40meq today and repeat K+ tomorrow  1/6- will recheck Monday 12. Insomnia: melatonin 5mg  and grounds pass ordered to help reset circadian rhythm. 1/2- will stop melatonin and try Trazodone 50 mg QHS- also ordered therapeutic pass.  1/6- sleeping great with Trazodone- con't regimen          LOS: 7 days A FACE TO FACE EVALUATION WAS PERFORMED  Kemuel Buchmann 06/26/2021, 8:11 AM

## 2021-06-26 NOTE — Progress Notes (Signed)
Speech Language Pathology Weekly Progress and Session Note  Patient Details  Name: Wayne Mendez MRN: [id number] Date of Birth: 1966/04/13  Beginning of progress report period: 06/20/2021 End of progress report period: 06/26/2021  Today's Date: 06/26/2021 SLP Individual Time: 1445-1530 SLP Individual Time Calculation (min): 45 min  Short Term Goals: Week 1: SLP Short Term Goal 1 (Week 1): Patient will tolerate regular texture solids, thin liquids diet without significant amount of overt s/s aspiration or penetration with mod I. SLP Short Term Goal 1 - Progress (Week 1): Met SLP Short Term Goal 2 (Week 1): Patient will name at least 10 items in a given category with modA verbal, semantic cues. SLP Short Term Goal 2 - Progress (Week 1): Progressing toward goal SLP Short Term Goal 3 (Week 1): Patient will participate in further assessment of more complex level (sentence, paragraph) reading comprehension. SLP Short Term Goal 3 - Progress (Week 1): Met SLP Short Term Goal 4 (Week 1): Patient will demonstrate awareness to semantic and phonemic errors during structured language tasks, with modA verbal cues. SLP Short Term Goal 4 - Progress (Week 1): Met SLP Short Term Goal 5 (Week 1): Patient will respond to open-ended questions utilizing word-level communication board and/or learned word-finding strategies, with minA verbal cues. SLP Short Term Goal 5 - Progress (Week 1): Progressing toward goal SLP Short Term Goal 6 (Week 1): Patient will be able to summarize after SLP reads aloud short 3-4 sentence paragraph, with 80% accuracy and minA verbal cues. SLP Short Term Goal 6 - Progress (Week 1): Not met    New Short Term Goals: Week 2: SLP Short Term Goal 1 (Week 2): Patient will be able to describe object/describe object function for common, everyday objects with modA verbal cues. SLP Short Term Goal 2 (Week 2): Patient will be able to match definition//description to corresponding object word in  field of 4-6 choices with minA verbal cues. SLP Short Term Goal 3 (Week 2): Patient will be able to copy printed word with 100% accuracy and modA verbal and visual cues. SLP Short Term Goal 4 (Week 2): Patient will be able to name at least 5 items in a given category with modA verbal cues. SLP Short Term Goal 5 (Week 2): Patient will be able to use printed word and short phrase communciation boards to make requests, comments with minA to funcitonally utilize. SLP Short Term Goal 6 (Week 2): Patient will demonstrate awareness to phonemic and semantic errors during highly structured language tasks, with mod-maxA cues.  Weekly Progress Updates:  Patient met 3 of 6 STG's and is progressing towards other goals. He did not meet goal of naming items in category or summarize after listening to short story and SLP feels these goals were perhaps too ambitious. Patient is tolerating regular solids, thin liquids without difficulties or overt s/s aspiration or penetration. He is demonstrating improvement in his ability to utilize verbal, gestural and visual (printed word, picture) communication, is demonstrating improved awareness to errors, improved ability to correct errors, improved frustration toleration. He has continued to be very motivated and works hard both during and outside of therapy sessions. He is expected to continue to make significant improvements with his language and cognition with continued CIR level SLP intervention.   Intensity: Minumum of 1-2 x/day, 30 to 90 minutes Frequency: 3 to 5 out of 7 days Duration/Length of Stay: 1/20 Treatment/Interventions: Speech/Language facilitation;Dysphagia/aspiration precaution training;Cognitive remediation/compensation;Environmental controls;Functional tasks;Multimodal communication approach;Patient/family education   Daily Session  Skilled Therapeutic Interventions:  Patient seen for skilled ST session focused on aphasia goals. He showed SLP completed  worksheets and for word to picture matching and sentence to picture matching he was 100% accurate. We then worked through a couple tasks that he reported he was unsure of. After SLP reviewed instructions and provided modA verbal and visual cues, patient then improved with accuracy and consistency, requiring only minA for choosing word in field of three that matched with description and for choosing correct word in field of 10+ to match to description. He was able to write with non dominant hand, but required mod-maxA cues to correctly transpose letters and for awareness to errors. His accuracy did improve with repeated trials and SLP decreased cues to min-modA. Patient continues to be encouraged by his progress, is less frustrated overall and has been working hard on exercises during free time from therapies. He is expected to continue to improve with ongoing SLP intervention to maximize his language, cognition prior to discharge.     General    Pain Pain Assessment Pain Scale: 0-10 Pain Score: 0-No pain  Therapy/Group: Individual Therapy  Sonia Baller, MA, CCC-SLP Speech Therapy

## 2021-06-26 NOTE — Consult Note (Signed)
Neuropsychological Consultation   Patient:   Wayne Mendez   DOB:   20-Apr-1966  MR Number:  [id number]  Location:  [facility] [address] CENTER A [address] [id number] [place] [address] Dept: [phone] Loc: [phone]           Date of Service:   06/26/2021  Start Time:   9 AM End Time:   10 AM  Provider/Observer:  Arley Phenix, Psy.D.       Clinical Neuropsychologist       Billing Code/Service: [phone]  Chief Complaint:    Wayne Mendez is a 56 year old male with past history of hypertension.  Patient presented on 06/15/2021 with acute onset of right-sided weakness and slurred speech.  CT/MRI showed intraparenchymal hemorrhage centered in the left basal ganglia extending into the left frontal and temporal lobes with mild surrounding edema and local mass-effect causing approximate 3 mm left to right midline shift.  Patient continued with persistent right-sided weakness as well as expressive language deficits and was admitted to the CIR for rehabilitative efforts.  Reason for Service:  The patient was referred for neuropsychological consultation due to coping and adjustment issues in the setting of recent left hemispheric infarction.  Below is the HPI for the current admission.  HPI: Wayne Mendez is a 56 year old right-handed male with history of hypertension.  Per chart review patient lives with spouse.  1 level home.  Independent prior to admission.  Presented 06/15/2021 with acute onset of right side weakness and slurred speech.  Blood pressure 240/110.  CT/MRI shows intraparenchymal hemorrhage centered in the left basal ganglia extending into the left frontal and temporal lobes with mild surrounding edema and local mass-effect causing approximate 3 mm left-to-right midline shift.  Echocardiogram ejection fraction 65 to 70% no wall motion abnormalities.  Admission chemistries unremarkable glucose 123, alcohol negative,  urine drug screen negative.  Neurology service follow-up conservative care close monitoring of blood pressure initially maintained on Cleviprex.  He was cleared to begin subcutaneous heparin for DVT prophylaxis 06/17/2021.  Tolerating a regular diet.  Therapy evaluations completed due to patient's right side weakness and slurred speech was admitted for a comprehensive rehab program.  Current Status:  Patient was awake and alert and sitting in his wheelchair with his right arm on wheelchair tray as I entered the room.  He was awake and alert and appeared to have good mental status/cognition with the exception of significant right-sided motor deficits and significant expressive aphasia.  However, he was able to effectively communicate but clearly had word finding issues, deficits in sentence structure and articulation.  Patient acknowledged right-sided motor deficits and was clearly aware of his expressive language difficulties.  He acknowledged significant improvement since the initial status after his stroke.  The patient had an appropriate emotional reaction when talking about his current status but acknowledged excellent motivation and desire to actively work on therapeutic interventions.  Behavioral Observation: Wayne Mendez  presents as a 56 y.o.-year-old Right handed Caucasian Male who appeared his stated age. his dress was Appropriate and he was Well Groomed and his manners were Appropriate to the situation.  his participation was indicative of Appropriate behaviors.  There were physical disabilities noted.  he displayed an appropriate level of cooperation and motivation.     Interactions:    Active Appropriate and Attentive  Attention:   within normal limits and attention span and concentration were age appropriate  Memory:   within normal limits; recent and remote  memory intact  Visuo-spatial:  not examined  Speech (Volume):  normal  Speech:   fluent aphasia; slurred  Thought  Process:  Coherent and Relevant  Though Content:  WNL; not suicidal and not homicidal  Orientation:   person, place, time/date, and situation  Judgment:   Good  Planning:   Fair  Affect:    Appropriate  Mood:    Dysphoric  Insight:   Good  Intelligence:   high   Medical History:   Past Medical History:  Diagnosis Date   Hypertension          Patient Active Problem List   Diagnosis Date Noted   Expressive aphasia    ICH (intracerebral hemorrhage) (HCC) 06/19/2021   Stroke, hemorrhagic (HCC) 06/15/2021          Psychiatric History:  No prior psychiatric history noted  Family Med/Psych History: History reviewed. No pertinent family history.  Impression/DX:  Wayne Mendez is a 56 year old male with past history of hypertension.  Patient presented on 06/15/2021 with acute onset of right-sided weakness and slurred speech.  CT/MRI showed intraparenchymal hemorrhage centered in the left basal ganglia extending into the left frontal and temporal lobes with mild surrounding edema and local mass-effect causing approximate 3 mm left to right midline shift.  Patient continued with persistent right-sided weakness as well as expressive language deficits and was admitted to the CIR for rehabilitative efforts.  Patient was awake and alert and sitting in his wheelchair with his right arm on wheelchair tray as I entered the room.  He was awake and alert and appeared to have good mental status/cognition with the exception of significant right-sided motor deficits and significant expressive aphasia.  However, he was able to effectively communicate but clearly had word finding issues, deficits in sentence structure and articulation.  Patient acknowledged right-sided motor deficits and was clearly aware of his expressive language difficulties.  He acknowledged significant improvement since the initial status after his stroke.  The patient had an appropriate emotional reaction when talking about his  current status but acknowledged excellent motivation and desire to actively work on therapeutic interventions.  Disposition/Plan:  Today we primarily worked on coping and adjustment issues as he has been actively cared for and seen by speech therapy.  The patient is ultimately able to express himself adequately for other people to understand but has clear word finding difficulties and also difficulties with sentence structure.  The patient's expressive language appeared to be worse as he tried to speak more quickly or describe or express emotionally salient content.  Diagnosis:    Nontraumatic subcortical hemorrhage of left cerebral hemisphere [facility]) - Plan: Ambulatory referral to Neurology         Electronically Signed   _______________________ Arley Phenix, Psy.D. Clinical Neuropsychologist

## 2021-06-26 NOTE — Progress Notes (Signed)
Physical Therapy Session Note  Patient Details  Name: Wayne Mendez MRN: [id number] Date of Birth: 05/23/1966  Today's Date: 06/26/2021 PT Individual Time: 0805-0900 PT Individual Time Calculation (min): 55 min   Short Term Goals: Week 1:  PT Short Term Goal 1 (Week 1): Pt will transfer to and from [facility] with min assist PT Short Term Goal 2 (Week 1): Pt will ambulate 51f with mod assist and LRAD PT Short Term Goal 3 (Week 1): Pt will propell WC [id number] supevision assist PT Short Term Goal 4 (Week 1): pt will perform bed mobility with min assist 50% of trials  Skilled Therapeutic Interventions/Progress Updates: Pt presented in bed agreeable to therapy. Pt denies pain during session. Performed supine to sit with CGA, verbal cues for sequencing and use of bed features. PTA donned shoes total A for time management. Performed squat pivot transfer to w/c with CGA and verbal cues for sequencing as pt initially attempted to impulsively squat over to w/c prior to BLE being properly placed. Pt noted that one leg rest was missing. Pt participated in w/c propulsion for endurance and propelled to w/c room via hemi technique. After PTA obtained leg rest, pt propelled in same manner to dayroom. After brief rest PTA set up pt in Lite Gait harness in w/c. Pt set up with ace bandage at R foot for DF assist and ace bandage at R hand for support. Pt required +2 assist to step onto treadmill. Pt then participated in gait training via Lite Gait requiring maxA for advancement of RLE, facilitation of hips for hip extension, facilitation for weight shifting to R and PTA blocking R knee to minimize buckling. With max cues pt was able to advance LLE but did compensation by flexing at hips. Pt was able to ambulate a total of 12828ft 0.3mp32mor 5 min with x 2 rest breaks due to fatigue. Pt returned to w/c in same manner as prior requiring x 2 assist to step down from treadmill. Pt transported back to room at end of session and  remained in w/c with belt alarm on, call bell within reach and needs met.       Therapy Documentation Precautions:  Precautions Precautions: Fall Precaution Comments: Rt hemi Restrictions Weight Bearing Restrictions: No General:   Vital Signs: Therapy Vitals Temp: 98.6 F (37 C) Temp Source: Oral Pulse Rate: 72 Resp: 16 BP: 117/86 Patient Position (if appropriate): Sitting Oxygen Therapy SpO2: 99 % O2 Device: Room Air Pain:   Mobility:   Locomotion :    Trunk/Postural Assessment :    Balance:   Exercises:   Other Treatments:      Therapy/Group: Individual Therapy  Florentino Laabs 06/26/2021, 4:19 PM

## 2021-06-27 NOTE — Plan of Care (Signed)
°  Problem: Consults °Goal: RH STROKE PATIENT EDUCATION °Description: See Patient Education module for education specifics  °Outcome: Progressing °  °Problem: RH SKIN INTEGRITY °Goal: RH STG MAINTAIN SKIN INTEGRITY WITH ASSISTANCE °Description: STG Maintain Skin Integrity With Mod I Assistance. °Outcome: Progressing °  °Problem: RH SAFETY °Goal: RH STG ADHERE TO SAFETY PRECAUTIONS W/ASSISTANCE/DEVICE °Description: STG Adhere to Safety Precautions With Cues and Reminders. °Outcome: Progressing °Goal: RH STG DECREASED RISK OF FALL WITH ASSISTANCE °Description: STG Decreased Risk of Fall With Mod I Assistance. °Outcome: Progressing °  °Problem: RH KNOWLEDGE DEFICIT °Goal: RH STG INCREASE KNOWLEDGE OF HYPERTENSION °Description: Patient will demonstrate knowledge of HTN medications, dietary recommendations, and BP management with educational materials and handouts provided by staff independently at discharge. °Outcome: Progressing °  °

## 2021-06-28 NOTE — Progress Notes (Signed)
Occupational Therapy Session Note  Patient Details  Name: Wayne Mendez MRN: [id number] Date of Birth: Jun 12, 1966  Today's Date: 06/28/2021 OT Individual Time: 0930-1040 OT Individual Time Calculation (min): 70 min    Short Term Goals: Week 2:  OT Short Term Goal 1 (Week 2): Pt will complete 1/3 components of toileting with no more than Mod balance assistance OT Short Term Goal 2 (Week 2): Pt will complete 2 grooming tasks while standing at the sink using RW for standing support as needed OT Short Term Goal 3 (Week 2): Pt will perform toilet tranfser with min A and min verbal cues for sequencing and safety  Skilled Therapeutic Interventions/Progress Updates:    Pt resting in w/c upon arrival. Initial focus on squat pivot transers, sitting balance, RUE NMR with NMES, RUE table tasks. Squat pivot transfers with min A and mod verbal cues for safety awareness. Pt has tendency to "throw" his body to surface transferring to. Mod verbal cues for sitting posture. Pt can activate anterior pelvic tilt but defaults to posterior pelvic tilt. RUE NMR including weight bearing (see below) with trace shoulder flexion noted following as demonstrated with ability to push stool away from body. RUE NMES per below:  1:1 NMES applied to RUE wrist and finger flexors   Ratio 1:3 Rate 35 pps Waveform- Asymmetric Ramp 1.0 Pulse 300 Intensity- 22 Duration -   10 mins   No adverse reactions after treatment and is skin intact. Flexion noted during NMES but pt unable to replicate when NMES terminated  Pt with small incontinent BM and transferred back to room to take shower. Squat pivot transfers with min A. Bathing with min A at shower level. UB dressing with min A. LB dressing with mod A. Mod verbal cues for thechnique and thoroughness. Pt remained in w/c with belt alrm activated. Half lap tray in place. All needs within reach.    Therapy Documentation Precautions:  Precautions Precautions: Fall Precaution  Comments: Rt hemi Restrictions Weight Bearing Restrictions: No   Pain:  Pt denies pain this morning Other Treatments: Treatments Neuromuscular Facilitation: Right;Upper Extremity;Activity to increase motor control;Activity to increase anterior-posterior weight shifting;Activity to increase timing and sequencing Weight Bearing Technique Weight Bearing Technique: Yes RUE Weight Bearing Technique: Extended arm seated Response to Weight Bearing Technique: trace sholder flexion   Therapy/Group: Individual Therapy  Rich Brave 06/28/2021, 11:48 AM

## 2021-06-28 NOTE — Progress Notes (Signed)
Occupational Therapy Session Note  Patient Details  Name: Wayne Mendez MRN: [id number] Date of Birth: [birth year]  Today's Date: 06/29/2021 OT Individual Time: 5638-9373 and 4287-6811 OT Individual Time Calculation (min): 43 min and 45 min  Skilled Therapeutic Interventions/Progress Updates:    Pt greeted in the w/c with no c/o pain, ready to go. Set pt up with Saebo Stim One for wrist/digit extensors and assisted him with grasp/release activity. Pt with awareness of noxious stimuli applied to certain areas on forearm, able to discern light touch at times on upper limb but unable to localize it on the Rt side. Education provided on using visualization strategies during task and also at night before going to sleep to promote neuroplastic changes. Pt remained sitting up at close of session, all needs within reach, half lap tray on chair. Tx focus placed on NMR for the Rt UE in order to improve functional use.   Saebo Stim One parameters are listed below:  Saebo Stim One 330 pulse width 35 Hz pulse rate On 8 sec/ off 8 sec Ramp up/ down 2 sec Symmetrical Biphasic wave form  Max intensity at 500 Ohm load   2nd Session 1:1 tx (45 min) Pt greeted in bed with no c/o pain, eager to participate and ready to go. Mod A for squat pivot<w/c going towards the Rt side. Pt then self propelled to the dayroom with vcs for Rt attention. Used the mirror for visual feedback in regards to postural alignment while pt stood statically and also when participating in forward/backward stepping. Pt required total facilitation to advance the affected Rt LE. While seated, pt engaged in mirror therapy targeting neuroplastic changes to promote functional return in the Rt UE. Pt returned to the room after and his wife asked about Ted hose. OT demonstrated 2 adaptive methods for donning. Wife appreciative. Pt remained sitting up with all needs within reach and safety belt fastened. Tx focus placed on NMR, postural control,  and pt/family education.   Therapy Documentation Precautions:  Precautions Precautions: Fall Precaution Comments: Rt hemi Restrictions Weight Bearing Restrictions: No Pain: Pain Assessment Pain Score: 0-No pain ADL: ADL Eating: Set up Grooming: Moderate assistance Where Assessed-Grooming: Edge of bed Upper Body Bathing: Moderate assistance Where Assessed-Upper Body Bathing: Edge of bed Lower Body Bathing: Maximal assistance Where Assessed-Lower Body Bathing: Edge of bed Upper Body Dressing: Minimal assistance Where Assessed-Upper Body Dressing: Chair Lower Body Dressing: Maximal assistance Where Assessed-Lower Body Dressing: Edge of bed Toileting: Not assessed Toilet Transfer: Dependent (using Stedy, pt 50%) Toilet Transfer Method: Stand pivot Acupuncturist: Grab bars Tub/Shower Transfer: Not assessed ADL Comments: All sit<stands and functional transfers completed using Stedy   Therapy/Group: Individual Therapy  Benn Tarver A Corene Resnick 06/29/2021, 12:42 PM

## 2021-06-28 NOTE — Progress Notes (Signed)
PROGRESS NOTE   Subjective/Complaints:  Pt reports feeling well- therapy going well per pt- no issues. Sleeping well on trazodone Family asking for short term prognosis- suggested if have stairs, likely would benefit from ramp.   ROS:   Pt denies SOB, abd pain, CP, N/V/C/D, and vision changes     Objective:   No results found. No results for input(s): WBC, HGB, HCT, PLT in the last 72 hours.  No results for input(s): NA, K, CL, CO2, GLUCOSE, BUN, CREATININE, CALCIUM in the last 72 hours.   Intake/Output Summary (Last 24 hours) at 06/28/2021 1205 Last data filed at 06/28/2021 4268 Gross per 24 hour  Intake 720 ml  Output --  Net 720 ml        Physical Exam: Vital Signs Blood pressure 128/77, pulse 75, temperature 98.1 F (36.7 C), temperature source Oral, resp. rate 16, height 6' (1.829 m), SpO2 97 %.        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     Comments: Alert. Expressive language deficits, non-fluent.  Some receptive deficits as well, likely apraxic, too. Able to tell me where he is with verbal cueing and provide some basic biographical information. Right central 7 and tongue deviation. Right arm and leg 1/2 sensation for LT and PP. RUE trace deltoid but otherwise 0/5.  RLE 1/5 HE, HAD, trace to absent distally. DTR's trace. No resting tone. LUE and LLE 5/5 with normal sensation  Psychiatric:        Mood and Affect: Mood normal.        Behavior: Behavior normal.      Assessment/Plan: 1. Functional deficits which require 3+  hours per day of interdisciplinary therapy in a comprehensive inpatient rehab setting. Physiatrist is providing close team supervision and 24 hour management of active medical problems listed below. Physiatrist and rehab team continue to assess barriers to discharge/monitor patient progress toward functional and medical goals  Care Tool:  Bathing    Body parts bathed by patient: Right arm, Left arm, Chest, Abdomen, Front perineal area, Right upper leg, Left upper leg, Left lower leg, Face   Body parts bathed by helper: Buttocks, Right lower leg     Bathing assist Assist Level: Minimal Assistance - Patient > 75%     Upper Body Dressing/Undressing Upper body dressing   What is the patient wearing?: Pull over shirt    Upper body assist Assist Level: Minimal Assistance - Patient > 75%    Lower Body Dressing/Undressing Lower body dressing      What is the patient wearing?: Underwear/pull up, Pants     Lower body assist Assist for lower body dressing: Moderate Assistance - Patient 50 - 74%     Toileting Toileting Toileting Activity did not occur (Clothing management and hygiene only): N/A (no void or bm)  Toileting  assist Assist for toileting: Total Assistance - Patient < 25% Assistive Device Comment: with urinal in bed   Transfers Chair/bed transfer  Transfers assist     Chair/bed transfer assist level: Minimal Assistance - Patient > 75%     Locomotion Ambulation   Ambulation assist   Ambulation activity did not occur: Safety/medical concerns  Assist level: 2 helpers Assistive device: Other (comment) (L handrail) Max distance: 30   Walk 10 feet activity   Assist  Walk 10 feet activity did not occur: Safety/medical concerns  Assist level: 2 helpers Assistive device: Other (comment) (L handrail)   Walk 50 feet activity   Assist Walk 50 feet with 2 turns activity did not occur: Safety/medical concerns         Walk 150 feet activity   Assist Walk  150 feet activity did not occur: Safety/medical concerns         Walk 10 feet on uneven surface  activity   Assist Walk 10 feet on uneven surfaces activity did not occur: Safety/medical concerns         Wheelchair     Assist Is the patient using a wheelchair?: Yes Type of Wheelchair: Manual    Wheelchair assist level: Supervision/Verbal cueing Max wheelchair distance: 150    Wheelchair 50 feet with 2 turns activity    Assist        Assist Level: Supervision/Verbal cueing   Wheelchair 150 feet activity     Assist      Assist Level: Supervision/Verbal cueing   Blood pressure 128/77, pulse 75, temperature 98.1 F (36.7 C), temperature source Oral, resp. rate 16, height 6' (1.829 m), SpO2 97 %.    Medical Problem List and Plan: 1. 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  Impaired mobility: continue Heparin  1/5- since been a week, will switch to Lovenox.              -antiplatelet therapy: N/A 3. Pain Management: Tylenol as needed 4. Mood: Provide emotional support             -antipsychotic agents: N/A 5. Neuropsych: This patient is? capable of making decisions on his own behalf. 6. Skin/Wound Care: Routine skin checks 7. Fluids/Electrolytes/Nutrition: Routine in and outs with follow-up chemistries 8.  Hypertension.  Norvasc 10 mg daily, lisinopril 20 mg twice daily, increase lopressor to 100mg  BID.   18/- BP doing well with change in meds- con't regimen 9.  Hyperlipidemia.  Hold due to IPH.  -Consider low dose statin at discharge 10. AKI:  placed nursing order to encourage 6-8 glasses of water per day, resolved on repeat, continue to monitor on Mondays  1/6- will recheck Monday-con't to push fluids PO  11. Suboptimal potassium: supplement 40meq today and repeat K+ tomorrow  1/6- will recheck Monday 12. Insomnia: melatonin 5mg  and grounds pass ordered to help reset circadian rhythm. 1/8- sleeping great with trazodone - will  need to take home with him 13. 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 Educated pt/friend on Spasticity and course of it.          LOS: 9 days A FACE TO FACE EVALUATION WAS PERFORMED  Voncile Schwarz 06/28/2021, 12:05 PM

## 2021-06-28 NOTE — Plan of Care (Signed)
°  Problem: Consults Goal: RH STROKE PATIENT EDUCATION Description: See Patient Education module for education specifics  Outcome: Progressing   Problem: RH SKIN INTEGRITY Goal: RH STG MAINTAIN SKIN INTEGRITY WITH ASSISTANCE Description: STG Maintain Skin Integrity With Mod I Assistance. Outcome: Progressing   Problem: RH SAFETY Goal: RH STG ADHERE TO SAFETY PRECAUTIONS W/ASSISTANCE/DEVICE Description: STG Adhere to Safety Precautions With Cues and Reminders. Outcome: Progressing Goal: RH STG DECREASED RISK OF FALL WITH ASSISTANCE Description: STG Decreased Risk of Fall With Mod I Assistance. Outcome: Progressing   Problem: RH KNOWLEDGE DEFICIT Goal: RH STG INCREASE KNOWLEDGE OF HYPERTENSION Description: Patient will demonstrate knowledge of HTN medications, dietary recommendations, and BP management with educational materials and handouts provided by staff independently at discharge. Outcome: Progressing

## 2021-06-28 NOTE — Progress Notes (Signed)
Physical Therapy Weekly Progress Note  Patient Details  Name: Wayne Mendez MRN: [id number] Date of Birth: [birth year]  Beginning of progress report period: June 20, 2021 End of progress report period: June 28, 2021  Today's Date: 06/28/2021 PT Individual Time: 0800-0900; 1100-1145 PT Individual Time Calculation (min): 60 min and 45 min  Patient has met 3 of 4 short term goals.  Pt is making good progress towards therapy goals. He is currently at [facility] to min A level for bed mobility, min A for squat pivot transfers bed to/from w/c, and Supervision for w/c mobility up to 150 ft with cues to attend to his R visual field. He has been able to initiate gait training with use of L handrail and also with assist from body weight supported treadmill training with the [place]. He does remain limited by ongoing impulsivity, R hemi-body weakness, and R inattention/neglect. Pt remains very motivated and exhibits great participation in therapy sessions.  Patient continues to demonstrate the following deficits muscle weakness, decreased cardiorespiratoy endurance, unbalanced muscle activation, decreased midline orientation, decreased attention to right, and right side neglect, decreased attention, decreased awareness, decreased problem solving, and decreased safety awareness, and decreased sitting balance, decreased standing balance, decreased postural control, hemiplegia, and decreased balance strategies and therefore will continue to benefit from skilled PT intervention to increase functional independence with mobility.  Patient progressing toward long term goals..  Continue plan of care.  PT Short Term Goals Week 1:  PT Short Term Goal 1 (Week 1): Pt will transfer to and from [facility] with min assist PT Short Term Goal 1 - Progress (Week 1): Met PT Short Term Goal 2 (Week 1): Pt will ambulate 24f with mod assist and LRAD PT Short Term Goal 2 - Progress (Week 1): Progressing toward goal PT Short Term Goal 3  (Week 1): Pt will propell WC [id number] supevision assist PT Short Term Goal 3 - Progress (Week 1): Met PT Short Term Goal 4 (Week 1): pt will perform bed mobility with min assist 50% of trials PT Short Term Goal 4 - Progress (Week 1): Met Week 2:  PT Short Term Goal 1 (Week 2): Pt will ambulation x 50 ft with mod A and LRAD PT Short Term Goal 2 (Week 2): Pt will maintain static standing balance with min A x 5 min PT Short Term Goal 3 (Week 2): Pt will initiate stair training as safe and able  Skilled Therapeutic Interventions/Progress Updates:    Session 1: Pt received seated in w/c in room, agreeable to PT session. No complaints of pain. Assisted pt with donning tennis shoes. Manual w/c propulsion to/from therapy gym at Supervision level, improved attention to R visual field and obstacles. Squat pivot transfer to/from mat table with min A. Sit to stand with min A to RW with assist needed to place R hand in orthosis on RW. Standing LLE 4" step-taps with RW and mod to max A for standing balance, use of mirror for visual feedback. Pt exhibits difficulty weight shifting onto RLE in standing with knee buckling in stance. Pt exhibits hip and knee flexion with buckling. Standing mini-squats 2 x 5 reps with RW and mirror for visual feedback with min A overall needed for balance, max multimodal cueing to maintain midline due to preference for weight bearing and weight shift to the L. Ambulation 2 x 30 ft with L handrail with mod A overall, close w/c follow for safety, use of mirror for visual feedback. Pt continues to struggle with  weigh shift onto RLE and with max manual cueing for weight shift has buckling of RLE in stance. Pt very frustrated by struggles with weight shift onto RLE and RLE buckling this date, provided emotional support. Pt left seated in w/c in room with needs in reach, quick release belt and chair alarm in place at end of session.  Session 2: Pt received seated in w/c in room, agreeable to  extra therapy session. No complaints of pain. Manual w/c propulsion to/from therapy gym at Supervision level. Squat pivot transfer w/c to/from mat table with min A. Obtained size L Giv-Mohr sling to assess use for RUE support in standing and during gait training. Pt would benefit from use of size L sling at this time. Remainder of session focus on standing with no AD, use of mirror for visual feedback, and manual cueing for lateral weight shift onto RLE. Sit to stand x 5 reps with 3" step under LLE, max manual cueing through RLE to keep limb on the ground during transfer. Pt initially struggles to maintain standing balance but with each trial exhibits improved balance and weight shift onto R limb. Squats 3 x 5 reps with min to mod A with improved ability to maintain midline from AM session. Pt returned to room, left seated in w/c with needs in reach, quick release belt in place at end of session.   Therapy Documentation Precautions:  Precautions Precautions: Fall Precaution Comments: Rt hemi Restrictions Weight Bearing Restrictions: No   Therapy/Group: Individual Therapy   Excell Seltzer, PT, DPT, CSRS 06/28/2021, 12:02 PM

## 2021-06-29 LAB — CBC WITH DIFFERENTIAL/PLATELET
Abs Immature Granulocytes: 0.02 10*3/uL (ref 0.00–0.07)
Basophils Absolute: 0 10*3/uL (ref 0.0–0.1)
Basophils Relative: 0 %
Eosinophils Absolute: 0.1 10*3/uL (ref 0.0–0.5)
Eosinophils Relative: 1 %
HCT: 39.6 % (ref 39.0–52.0)
Hemoglobin: 13.3 g/dL (ref 13.0–17.0)
Immature Granulocytes: 0 %
Lymphocytes Relative: 15 %
Lymphs Abs: 1 10*3/uL (ref 0.7–4.0)
MCH: 30.2 pg (ref 26.0–34.0)
MCHC: 33.6 g/dL (ref 30.0–36.0)
MCV: 89.8 fL (ref 80.0–100.0)
Monocytes Absolute: 0.6 10*3/uL (ref 0.1–1.0)
Monocytes Relative: 9 %
Neutro Abs: 5 10*3/uL (ref 1.7–7.7)
Neutrophils Relative %: 75 %
Platelets: 188 10*3/uL (ref 150–400)
RBC: 4.41 MIL/uL (ref 4.22–5.81)
RDW: 13.7 % (ref 11.5–15.5)
WBC: 6.7 10*3/uL (ref 4.0–10.5)
nRBC: 0 % (ref 0.0–0.2)

## 2021-06-29 LAB — COMPREHENSIVE METABOLIC PANEL
ALT: 68 U/L — ABNORMAL HIGH (ref 0–44)
AST: 25 U/L (ref 15–41)
Albumin: 3.4 g/dL — ABNORMAL LOW (ref 3.5–5.0)
Alkaline Phosphatase: 47 U/L (ref 38–126)
Anion gap: 9 (ref 5–15)
BUN: 17 mg/dL (ref 6–20)
CO2: 30 mmol/L (ref 22–32)
Calcium: 9 mg/dL (ref 8.9–10.3)
Chloride: 101 mmol/L (ref 98–111)
Creatinine, Ser: 1.16 mg/dL (ref 0.61–1.24)
GFR, Estimated: >60 mL/min (ref >60)
Glucose, Bld: 104 mg/dL — ABNORMAL HIGH (ref 70–99)
Potassium: 3.7 mmol/L (ref 3.5–5.1)
Sodium: 140 mmol/L (ref 135–145)
Total Bilirubin: 0.8 mg/dL (ref 0.3–1.2)
Total Protein: 6.4 g/dL — ABNORMAL LOW (ref 6.5–8.1)

## 2021-06-29 NOTE — Progress Notes (Addendum)
Speech Language Pathology Daily Session Note  Patient Details  Name: Antwaun Bernick MRN: [id number] Date of Birth: Sep 07, 1965  Today's Date: 06/29/2021 SLP Individual Time: 0800-0830 SLP Individual Time Calculation (min): 30 min  Short Term Goals: Week 2: SLP Short Term Goal 1 (Week 2): Patient will be able to describe object/describe object function for common, everyday objects with modA verbal cues. SLP Short Term Goal 2 (Week 2): Patient will be able to match definition//description to corresponding object word in field of 4-6 choices with minA verbal cues. SLP Short Term Goal 3 (Week 2): Patient will be able to copy printed word with 100% accuracy and modA verbal and visual cues. SLP Short Term Goal 4 (Week 2): Patient will be able to name at least 5 items in a given category with modA verbal cues. SLP Short Term Goal 5 (Week 2): Patient will be able to use printed word and short phrase communciation boards to make requests, comments with minA to funcitonally utilize. SLP Short Term Goal 6 (Week 2): Patient will demonstrate awareness to phonemic and semantic errors during highly structured language tasks, with mod-maxA cues.  Skilled Therapeutic Interventions: Skilled ST services focused on language skills. Pt's friend was present for treatment session. SLP facilitated object matching to function phrase and function phrase matching to object in fields of 4-5, pt demonstrated mod I. Pt initially required supervision A verbal cues to recognize phonemic errors when reading at phrase level fading to mod I ability to recognize and correct errors. SLP collaborated with pt pertaining to acute deficits in language and setting goals for this week, pt stated deficits in ammonia are the most severe verse reading/witting with nondominant hand. Pt is able to verbally communicate in conversation, however speech is marked by moderate anomia and reduced cohesive thoughts. Pt required max A phonemic cues to name  items in a category (fruits and vegetable.) SLP provided education to pt and pt's friend pertaining to level of cues to aid in word finding, pt's friend return demonstration providing cloze phrase and personal example. Pt was left in room with call bell within reach and bed alarm set. SLP recommends to continue skilled services.      Pain Pain Assessment Pain Scale: 0-10 Pain Score: 0-No pain  Therapy/Group: Individual Therapy  Dayshia Ballinas  [facility] 06/29/2021, 10:31 AM

## 2021-06-29 NOTE — Progress Notes (Signed)
Physical Therapy Session Note  Patient Details  Name: Wayne Mendez MRN: [id number] Date of Birth: [birth year]  Today's Date: 06/29/2021 PT Individual Time: 0900-1000 PT Individual Time Calculation (min): 60 min   Short Term Goals: Week 2:  PT Short Term Goal 1 (Week 2): Pt will ambulation x 50 ft with mod A and LRAD PT Short Term Goal 2 (Week 2): Pt will maintain static standing balance with min A x 5 min PT Short Term Goal 3 (Week 2): Pt will initiate stair training as safe and able  Skilled Therapeutic Interventions/Progress Updates:    Pt received supine in bed, agreeable to PT session. No complaints of pain. Supine to sit with Supervision with HOB slightly elevated and use of bedrail. Squat pivot transfer to w/c with min A. Manual w/c propulsion 2 x 150 ft at Supervision level with use of L UE/LE. Sit to stand with min A to RW while LiteGait sling placed with assist x 2 for safety and balance. Also assisted pt with donning RUE Giv-Mohr sling for UE support in standing. Also trialed use of GRAFO for RLE during gait. Session focus on gait training with use of LiteGait, pt dependent to max A to advance RLE. No difference in gait noted with use of GRAFO due to dependence with limb advancement. Pt ambulates at 0.5 mph, unable to record total distance ambulated this session due to equipment error. Pt also continues to exhibit poor weight shift to the R during gait and would benefit from a 2nd set of skilled hands to assist with lateral weight shifting during gait. Pt also continues to exhibit R knee buckling with stance with max A needed to maintain knee extension in stance. Pt fatigued following gait training on LiteGait. Pt returned to w/c, left seated in room with needs in reach, quick release belt and chair alarm in place.  Therapy Documentation Precautions:  Precautions Precautions: Fall Precaution Comments: Rt hemi Restrictions Weight Bearing Restrictions: No     Therapy/Group: Individual  Therapy   Peter Congo, PT, DPT, CSRS  06/29/2021, 11:41 AM

## 2021-06-29 NOTE — Progress Notes (Signed)
Speech Language Pathology Daily Session Note  Patient Details  Name: Ridhaan Dreibelbis MRN: [id number] Date of Birth: May 22, 1966  Today's Date: 06/29/2021 SLP Individual Time: 1443-1540 SLP Individual Time Calculation (min): 30 min  Short Term Goals: Week 2: SLP Short Term Goal 1 (Week 2): Patient will be able to describe object/describe object function for common, everyday objects with modA verbal cues. SLP Short Term Goal 2 (Week 2): Patient will be able to match definition//description to corresponding object word in field of 4-6 choices with minA verbal cues. SLP Short Term Goal 3 (Week 2): Patient will be able to copy printed word with 100% accuracy and modA verbal and visual cues. SLP Short Term Goal 4 (Week 2): Patient will be able to name at least 5 items in a given category with modA verbal cues. SLP Short Term Goal 5 (Week 2): Patient will be able to use printed word and short phrase communciation boards to make requests, comments with minA to funcitonally utilize. SLP Short Term Goal 6 (Week 2): Patient will demonstrate awareness to phonemic and semantic errors during highly structured language tasks, with mod-maxA cues.  Skilled Therapeutic Interventions: Skilled treatment session focused on communication goals. SLP facilitated session by utilizing semantic feature analysis to maximize word-finding. Patient was able to read the name of an object out loud and required overall Max A verbal cues to complete the feature analysis template. Mod verbal cues were needed for patient to utilize the template to generate verbal descriptions of specific items during a structured language task. Patient left upright in wheelchair with alarm on and all needs within reach. Continue with current plan of care.      Pain No/Denies Pain   Therapy/Group: Individual Therapy  Helana Macbride 06/29/2021, 3:07 PM

## 2021-06-29 NOTE — Progress Notes (Signed)
PROGRESS NOTE   Subjective/Complaints:    ROS:   Pt denies SOB, abd pain, CP, N/V/C/D, and vision changes     Objective:   No results found. Recent Labs    06/29/21 0506  WBC 6.7  HGB 13.3  HCT 39.6  PLT 188    Recent Labs    06/29/21 0506  NA 140  K 3.7  CL 101  CO2 30  GLUCOSE 104*  BUN 17  CREATININE 1.16  CALCIUM 9.0     Intake/Output Summary (Last 24 hours) at 06/29/2021 0915 Last data filed at 06/29/2021 0824 Gross per 24 hour  Intake 1020 ml  Output --  Net 1020 ml         Physical Exam: Vital Signs Blood pressure 112/75, pulse 65, temperature 99.5 F (37.5 C), resp. rate 17, height 6' (1.829 m), SpO2 96 %.   General: No acute distress Mood and affect are appropriate Heart: Regular rate and rhythm no rubs murmurs or extra sounds Lungs: Clear to auscultation, breathing unlabored, no rales or wheezes Abdomen: Positive bowel sounds, soft nontender to palpation, nondistended Extremities: No clubbing, cyanosis, or edema Skin: No evidence of breakdown, no evidence of rash MSK- no pain with shoulder ROM     Mental Status: He is alert.     Comments: Alert. Expressive language deficits, non-fluent.  Some receptive deficits as well, likely apraxic, too. Able to tell me where he is with verbal cueing and provide some basic biographical information. Right central 7 and tongue deviation. Right arm and leg 1/2 sensation for LT and PP. RUE trace deltoid but otherwise 0/5.  RLE 1/5 HE, HAD, trace to absent distally. DTR's trace. No resting tone. LUE and LLE 5/5 with normal sensation  Psychiatric:        Mood and Affect: Mood normal.        Behavior: Behavior normal.      Assessment/Plan: 1. Functional deficits which require 3+ hours per day of interdisciplinary therapy in a comprehensive inpatient rehab setting. Physiatrist is providing close team supervision and 24 hour management of active  medical problems listed below. Physiatrist and rehab team continue to assess barriers to discharge/monitor patient progress toward functional and medical goals  Care Tool:  Bathing    Body parts bathed by patient: Right arm, Left arm, Chest, Abdomen, Front perineal area, Right upper leg, Left upper leg, Left lower leg, Face   Body parts bathed by helper: Buttocks, Right lower leg     Bathing assist Assist Level: Minimal Assistance - Patient > 75%     Upper Body Dressing/Undressing Upper body dressing   What is the patient wearing?: Pull over shirt    Upper body assist Assist Level: Minimal Assistance - Patient > 75%    Lower Body Dressing/Undressing Lower body dressing      What is the patient wearing?: Underwear/pull up, Pants     Lower body assist Assist for lower body dressing: Moderate Assistance - Patient 50 - 74%     Toileting Toileting Toileting Activity did not occur (Clothing management and hygiene only): N/A (no void or bm)  Toileting assist Assist for toileting: Total Assistance - Patient < 25%  Assistive Device Comment: with urinal in bed   Transfers Chair/bed transfer  Transfers assist     Chair/bed transfer assist level: Minimal Assistance - Patient > 75%     Locomotion Ambulation   Ambulation assist   Ambulation activity did not occur: Safety/medical concerns  Assist level: 2 helpers Assistive device: Other (comment) (L handrail) Max distance: 30   Walk 10 feet activity   Assist  Walk 10 feet activity did not occur: Safety/medical concerns  Assist level: 2 helpers Assistive device: Other (comment) (L handrail)   Walk 50 feet activity   Assist Walk 50 feet with 2 turns activity did not occur: Safety/medical concerns         Walk 150 feet activity   Assist Walk 150 feet activity did not occur: Safety/medical concerns         Walk 10 feet on uneven surface  activity   Assist Walk 10 feet on uneven surfaces activity  did not occur: Safety/medical concerns         Wheelchair     Assist Is the patient using a wheelchair?: Yes Type of Wheelchair: Manual    Wheelchair assist level: Supervision/Verbal cueing Max wheelchair distance: 150    Wheelchair 50 feet with 2 turns activity    Assist        Assist Level: Supervision/Verbal cueing   Wheelchair 150 feet activity     Assist      Assist Level: Supervision/Verbal cueing   Blood pressure 112/75, pulse 65, temperature 99.5 F (37.5 C), resp. rate 17, height 6' (1.829 m), SpO2 96 %.    Medical Problem List and Plan: 1. 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  Impaired mobility: continue Heparin  1/5- since been a week, will switch to Lovenox.              -antiplatelet therapy: N/A 3. Pain Management: Tylenol as needed 4. Mood: Provide emotional support             -antipsychotic agents: N/A 5. Neuropsych: This patient is? capable of making decisions on his own behalf. 6. Skin/Wound Care: Routine skin checks 7. Fluids/Electrolytes/Nutrition: Routine in and outs with follow-up chemistries 8.  Hypertension.  Norvasc 10 mg daily, lisinopril 20 mg twice daily, increase lopressor to 100mg  BID.    Vitals:   06/28/21 1925 06/29/21 0601  BP: 128/86 112/75  Pulse: 72 65  Resp: 18 17  Temp: 98.2 F (36.8 C) 99.5 F (37.5 C)  SpO2: 99% 96%  Well controlled 1/9  9.  Hyperlipidemia.  Hold due to IPH.  -Consider low dose statin at discharge 10. AKI: placed nursing order to encourage 6-8 glasses of water per day, resolved  on repeat, continue to monitor on Mondays  1/6- will recheck Monday-con't to push fluids PO  11. Suboptimal potassium: supplement 09-17-1973 today and repeat K+ tomorrow  1/6- will recheck Monday 12. Insomnia: melatonin 5mg   and grounds pass ordered to help reset circadian rhythm. 1/8- sleeping great with trazodone - will need to take home with him 13. 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 Educated pt/friend on Spasticity and course of it.          LOS: 10 days A FACE TO FACE EVALUATION WAS PERFORMED  Erick Colacendrew E Brytni Dray 06/29/2021, 9:15 AM

## 2021-06-29 NOTE — Plan of Care (Signed)
°  Problem: RH Ambulation Goal: LTG Patient will ambulate in controlled environment (PT) Description: LTG: Patient will ambulate in a controlled environment, # of feet with assistance (PT). Flowsheets (Taken 06/29/2021 1151) LTG: Pt will ambulate in controlled environ  assist needed:: (downgrade due to slow progress) Moderate Assistance - Patient 50 - 74% LTG: Ambulation distance in controlled environment: 50 ft with LRAD Note: Downgrade due to slow progress Goal: LTG Patient will ambulate in home environment (PT) Description: LTG: Patient will ambulate in home environment, # of feet with assistance (PT). Flowsheets (Taken 06/29/2021 1151) LTG: Pt will ambulate in home environ  assist needed:: (downgrade due to slow progress) Moderate Assistance - Patient 50 - 74% LTG: Ambulation distance in home environment: 25 ft with LRAD Note: Downgrade due to slow progress   Problem: RH Stairs Goal: LTG Patient will ambulate up and down stairs w/assist (PT) Description: LTG: Patient will ambulate up and down # of stairs with assistance (PT) Flowsheets (Taken 06/29/2021 1151) LTG: Pt will ambulate up/down stairs assist needed:: (downgrade due to slow progress) Moderate Assistance - Patient 50 - 74% LTG: Pt will  ambulate up and down number of stairs: 3 steps with L handrail to access home Note: Downgrade due to slow progress

## 2021-06-30 NOTE — Patient Care Conference (Signed)
Inpatient RehabilitationTeam Conference and Plan of Care Update Date: 06/30/2021   Time: 11:06 AM    Patient Name: Wayne Mendez      Medical Record Number: [id number]  Date of Birth: 1965-11-13 Sex: Male         Room/Bed: 4W07C/4W07C-01 Payor Info: Payor: Theme park manager / Plan: Theme park manager OTHER / Product Type: *No Product type* /    Admit Date/Time:  06/19/2021  4:37 PM  Primary Diagnosis:  ICH (intracerebral hemorrhage) [facility])  Hospital Problems: Principal Problem:   ICH (intracerebral hemorrhage) ([place]) Active Problems:   Expressive aphasia    Expected Discharge Date: Expected Discharge Date: 07/10/21  Team Members Present: Physician leading conference: Dr. Alger Simons Social Worker Present: Loralee Pacas, [place] Nurse Present: Dorthula Nettles, RN PT Present: Excell Seltzer, PT OT Present: Roanna Epley, [place], OT SLP Present: Charolett Bumpers, SLP PPS Coordinator present : Gunnar Fusi, SLP     Current Status/Progress Goal Weekly Team Focus  Bowel/Bladder   Continent of b/b. LBM 1/9  Remain continent  Assist with toileting needs as needed   Swallow/Nutrition/ Hydration   goals met mod I         ADL's   bating-min A: ub dressing-min A: LB dressing-min A: transers-min A; Rt inattention, impulsive,  supervision overall  BADL retraining, RUE NMR, tranfsers, safety awareness, attention to Rt   Mobility   Supervision bed mobility, min A squat pivot transfer, min A to stand to RW, Supervision w/c mobility, +2 for gait at rail or with LiteGait  Supervision transfers, CGA gait and stairs, may need to downgrade gait and stair goals  R NMR, gait, safety   Communication   Mod A conversation, Mod I basic multimodal  mod I basic multimodal, supervision to minA more complex  word finding startegies, reading comprehension and accuracy at sentence/ paragraph level and writting at word level/phrase level   Safety/Cognition/ Behavioral Observations  Min A error  awareness  mod I awareness, attention  error awareness   Pain   no c/o pain  <3 out of 10  Assess Qshift and prn   Skin   Bruising to abdomen, Remainder of skin intact.  no new skin breakdown  Assess Qshift and prn     Discharge Planning:  D/c to home with his wife and support from his friend that will be coming back from [place] to help provide assistance at time of discharge.   Team Discussion: BP controlled, developed spasticity. Trazodone for sleep. Spouse reports patient is depressed, will begin medication for mood. Will need a family meeting. Patient doesn't feel like he is making progress. Continue to encourage patient. Min assist overall, impulsivity improving. Bathing and dressing min assist standing. Minimal return return to right arm. Communication and cognition min assist currently. Slowing down well with speech and reading.   Patient on target to meet rehab goals: yes, downgraded goals to supervision transfers, mod I WC, mod assist gait  *See Care Plan and progress notes for long and short-term goals. Supervision ADL's, may have to downgrade to contact guard assist. Mod I goals SLP.  Revisions to Treatment Plan:  Adjusting medications, downgrading goals.  Teaching Needs: Family education, medication management, depression management, safety awareness, transfer/gait training, etc.  Current Barriers to Discharge: Decreased caregiver support, Home enviroment access/layout, Lack of/limited family support, Weight, and Medication compliance  Possible Resolutions to Barriers: Family education Order recommended DME Follow-up PT/OT/SLP     Medical Summary Current Status: early tone, managing conservatively. bp control improved.  depression?  Barriers to Discharge: Medical stability   Possible Resolutions to [employer] Focus: daily assessment of labs and pt data, depression assessment   Continued Need for Acute Rehabilitation Level of Care: The patient requires daily  medical management by a physician with specialized training in physical medicine and rehabilitation for the following reasons: Direction of a multidisciplinary physical rehabilitation program to maximize functional independence : Yes Medical management of patient stability for increased activity during participation in an intensive rehabilitation regime.: Yes Analysis of laboratory values and/or radiology reports with any subsequent need for medication adjustment and/or medical intervention. : Yes   I attest that I was present, lead the team conference, and concur with the assessment and plan of the team.   Dorthula Nettles G 06/30/2021, 4:00 PM

## 2021-06-30 NOTE — Progress Notes (Signed)
Occupational Therapy Session Note  Patient Details  Name: Wayne Mendez MRN: [id number] Date of Birth: [birth year]  Today's Date: 06/30/2021 OT Individual Time: 0700-0809 OT Individual Time Calculation (min): 69 min    Short Term Goals: Week 2:  OT Short Term Goal 1 (Week 2): Pt will complete 1/3 components of toileting with no more than Mod balance assistance OT Short Term Goal 2 (Week 2): Pt will complete 2 grooming tasks while standing at the sink using RW for standing support as needed OT Short Term Goal 3 (Week 2): Pt will perform toilet tranfser with min A and min verbal cues for sequencing and safety  Skilled Therapeutic Interventions/Progress Updates:    Pt resting in bed upon arrival and ready to get OOB. Pt agreeable to bathing/dressing with sit<>stand from w/c at sink. Pt also requested to use toilet. All squat pivot transfers with min A and mod verbal cues for safety and sequencing. Toileting with max A. Bathing with min A with sit<>stand at sink. LB dressing with min A for fastening shoes and standing balance. Transition to day room. Standing at hi-lo table with weight bearing through RUE followed by forced use with minimal shoulder flexion and adduction noted. Pt propelled w/c back to room and remained seated in w/c with all needs within reach. Belt alarm activated.   Therapy Documentation Precautions:  Precautions Precautions: Fall Precaution Comments: Rt hemi Restrictions Weight Bearing Restrictions: No Pain:  Pt denies pain this morning  Other Treatments: Treatments Neuromuscular Facilitation: Right;Upper Extremity;Activity to increase motor control;Activity to increase timing and sequencing;Forced use Weight Bearing Technique Weight Bearing Technique: Yes RUE Weight Bearing Technique: Extended arm standing Response to Weight Bearing Technique: improved shoulder flexion/extension   Therapy/Group: Individual Therapy  Rich Brave 06/30/2021, 8:13 AM

## 2021-06-30 NOTE — Progress Notes (Signed)
Physical Therapy Session Note  Patient Details  Name: Wayne Mendez MRN: [id number] Date of Birth: 09-Jul-1965  Today's Date: 06/30/2021 PT Individual Time: 1130-1200; 1340-1453 PT Individual Time Calculation (min): 30 min 73 min   Short Term Goals: Week 2:  PT Short Term Goal 1 (Week 2): Pt will ambulation x 50 ft with mod A and LRAD PT Short Term Goal 2 (Week 2): Pt will maintain static standing balance with min A x 5 min PT Short Term Goal 3 (Week 2): Pt will initiate stair training as safe and able  Skilled Therapeutic Interventions/Progress Updates:    Session 1: Pt received seated in w/c in room, agreeable to PT session. No complaints of pain. Manual w/c propulsion to/from therapy gym at Supervision level with use of L UE/LE with mod cueing to attend to R visual field for obstacle avoidance. Stand pivot transfer to/from [place] with min A. Session focus on standing on [place] with use of mirror for visual feedback for maintaining midline. Pt initially struggles to weight shift onto RLE but with cueing to keep RLE planted on Kinetron platform and visual feedback from mirror pt exhibits improved ability to achieve midline and equal weight-bearing through BLE. Pt also initially heavily reliant on use of LUE to reposition himself in standing, encouraged decreased use of UE and pt able to perform weight-shifting with use of core and LE musculature. Pt returned to w/c and left seated in w/c in room with needs in reach, quick release belt and chair alarm in place at end of session.  Session 2: Pt received seated in w/c in room, agreeable to start therapy session early. No complaints of pain. [name] present and engaged in discussion regarding no change in d/c date from conference, family meeting to take place next week after pt's primary rehab MD (Dr. Dagoberto Ligas) returns, pt's CLOF and expected LOF at d/c, and recommendation of therapy that a ramp be in place upon d/c home. [name] reports they  already have a family friend who plans to install a ramp for them prior to d/c home as well as assist with other home modifications as needed. Also provided handout for stroke support group for patient and his wife. Manual w/c propulsion 2 x 150 ft at Supervision level during session with use of L UE/LE. Sit to stand with min A to L handrail in hallway during session. Ambulation 3 x 30 ft with L handrail and mod A overall for balance with close w/c follow for safety with RLE DF assist ACE wrap. Pt exhibits improved ability to weight shift onto RLE with use of mirror as well as verbal and manual cues. Pt also exhibits improved ability to advance RLE during gait. Pt encouraged by improvements in gait this date. Standing LLE 3" step-ups with L handrail and mod to max A for balance. Pt requires assist to weight shift onto RLE prior to lifting LLE on/off step. Pt left seated in recliner in room with needs in reach, BLE elevated, wife present at end of session.  Therapy Documentation Precautions:  Precautions Precautions: Fall Precaution Comments: Rt hemi Restrictions Weight Bearing Restrictions: No      Therapy/Group: Individual Therapy   Excell Seltzer, PT, DPT, CSRS  06/30/2021, 5:03 PM

## 2021-06-30 NOTE — Progress Notes (Signed)
Patient ID: Wayne Mendez, male   DOB: May 07, 1966, 56 y.o.   MRN: [id number]  SW met with pt and [name] in room to provide updates from team conference, and d/c date remains 07/10/21. Fam edu on Monday 10am-2pm with pt wife and son. SW received short term disability forms.   SW provided forms to physician.   Loralee Pacas, MSW, [place] Office: [phone] Cell: [phone] Fax: [phone]

## 2021-06-30 NOTE — Progress Notes (Signed)
PROGRESS NOTE   Subjective/Complaints: Patient has had 2 therapies early today.  No issues.  Denies any significant pains.  States that bowel and bladder are doing okay No c/o today  Labs reviewed  ROS:   Pt denies SOB, abd pain, CP, N/V/C/D, and vision changes     Objective:   No results found. Recent Labs    06/29/21 0506  WBC 6.7  HGB 13.3  HCT 39.6  PLT 188     Recent Labs    06/29/21 0506  NA 140  K 3.7  CL 101  CO2 30  GLUCOSE 104*  BUN 17  CREATININE 1.16  CALCIUM 9.0      Intake/Output Summary (Last 24 hours) at 06/30/2021 0930 Last data filed at 06/30/2021 0749 Gross per 24 hour  Intake 360 ml  Output 400 ml  Net -40 ml         Physical Exam: Vital Signs Blood pressure 135/86, pulse 71, temperature 98.2 F (36.8 C), resp. rate 18, height 6' (1.829 m), SpO2 98 %.   General: No acute distress Mood and affect are appropriate Heart: Regular rate and rhythm no rubs murmurs or extra sounds Lungs: Clear to auscultation, breathing unlabored, no rales or wheezes Abdomen: Positive bowel sounds, soft nontender to palpation, nondistended Extremities: No clubbing, cyanosis, or edema Skin: No evidence of breakdown, no evidence of rash MSK- no pain with shoulder ROM     Mental Status: He is alert.     Comments: Alert. Expressive language deficits, non-fluent.  Some receptive deficits as well, likely apraxic, too. Able to tell me where he is with verbal cueing and provide some basic biographical information. Right central 7 and tongue deviation. Right arm and leg 1/2 sensation for LT and PP. RUE trace deltoid but otherwise 0/5.  RLE 1/5 HE, HAD, trace to absent distally. DTR's trace. No resting tone. LUE and LLE 5/5 with normal sensation  Psychiatric:        Mood and Affect: Mood normal.        Behavior: Behavior normal.      Assessment/Plan: 1. Functional deficits which require 3+ hours per  day of interdisciplinary therapy in a comprehensive inpatient rehab setting. Physiatrist is providing close team supervision and 24 hour management of active medical problems listed below. Physiatrist and rehab team continue to assess barriers to discharge/monitor patient progress toward functional and medical goals  Care Tool:  Bathing    Body parts bathed by patient: Right arm, Left arm, Chest, Abdomen, Front perineal area, Right upper leg, Left upper leg, Right lower leg, Left lower leg, Face   Body parts bathed by helper: Buttocks     Bathing assist Assist Level: Minimal Assistance - Patient > 75%     Upper Body Dressing/Undressing Upper body dressing   What is the patient wearing?: Pull over shirt    Upper body assist Assist Level: Supervision/Verbal cueing    Lower Body Dressing/Undressing Lower body dressing      What is the patient wearing?: Underwear/pull up, Pants     Lower body assist Assist for lower body dressing: Minimal Assistance - Patient > 75%     Financial trader  Activity did not occur (Clothing management and hygiene only): N/A (no void or bm)  Toileting assist Assist for toileting: Total Assistance - Patient < 25% Assistive Device Comment: with urinal in bed   Transfers Chair/bed transfer  Transfers assist     Chair/bed transfer assist level: Minimal Assistance - Patient > 75%     Locomotion Ambulation   Ambulation assist   Ambulation activity did not occur: Safety/medical concerns  Assist level: Dependent - Patient 0% Assistive device: Lite Gait Max distance: 150'   Walk 10 feet activity   Assist  Walk 10 feet activity did not occur: Safety/medical concerns  Assist level: Dependent - Patient 0% Assistive device: Lite Gait   Walk 50 feet activity   Assist Walk 50 feet with 2 turns activity did not occur: Safety/medical concerns  Assist level: Dependent - Patient 0% Assistive device: Lite Gait    Walk 150  feet activity   Assist Walk 150 feet activity did not occur: Safety/medical concerns  Assist level: Dependent - Patient 0% Assistive device: Lite Gait    Walk 10 feet on uneven surface  activity   Assist Walk 10 feet on uneven surfaces activity did not occur: Safety/medical concerns         Wheelchair     Assist Is the patient using a wheelchair?: Yes Type of Wheelchair: Manual    Wheelchair assist level: Supervision/Verbal cueing Max wheelchair distance: 150    Wheelchair 50 feet with 2 turns activity    Assist        Assist Level: Supervision/Verbal cueing   Wheelchair 150 feet activity     Assist      Assist Level: Supervision/Verbal cueing   Blood pressure 135/86, pulse 71, temperature 98.2 F (36.8 C), resp. rate 18, height 6' (1.829 m), SpO2 98 %.    Medical Problem List and Plan: 1. 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  Impaired mobility: continue Heparin  1/5- since been a week, will switch to Lovenox.              -antiplatelet therapy: N/A 3. Pain Management: Tylenol as needed 4. Mood: Provide emotional support             -antipsychotic agents: N/A 5. Neuropsych: This patient is? capable of making decisions on his own behalf. 6. Skin/Wound Care: Routine skin checks 7. Fluids/Electrolytes/Nutrition: Routine in and outs with follow-up chemistries 8.  Hypertension.  Norvasc 10 mg daily, lisinopril 20 mg twice daily, increase lopressor to 100mg  BID.    Vitals:   06/29/21 1927 06/30/21 0603  BP: 119/74 135/86  Pulse: 74 71   Resp: 17 18  Temp: 98.2 F (36.8 C) 98.2 F (36.8 C)  SpO2: 97% 98%  Well controlled 1/10  9.  Hyperlipidemia.  Hold due to IPH.  -Consider low dose statin at discharge 10. AKI: placed nursing order to encourage 6-8 glasses of water per  day, resolved on repeat, continue to monitor on Mondays  1/6- will recheck Monday-con't to push fluids PO  11. Suboptimal potassium: supplement today and repeat K+ tomorrow  1/6- will recheck Monday 12. Insomnia: melatonin 5mg  and grounds pass ordered to help reset circadian rhythm. 1/8- sleeping great with trazodone - will need to take home with him 13. 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 Educated pt/friend on Spasticity and course of it.          LOS: 11 days A FACE TO FACE EVALUATION WAS PERFORMED  3/8 06/30/2021, 9:30 AM

## 2021-06-30 NOTE — Progress Notes (Signed)
Speech Language Pathology Daily Session Note  Patient Details  Name: Wayne Mendez MRN: [id number] Date of Birth: 05-02-1966  Today's Date: 06/30/2021 SLP Individual Time: 1287-8676 SLP Individual Time Calculation (min): 57 min  Short Term Goals: Week 2: SLP Short Term Goal 1 (Week 2): Patient will be able to describe object/describe object function for common, everyday objects with modA verbal cues. SLP Short Term Goal 2 (Week 2): Patient will be able to match definition//description to corresponding object word in field of 4-6 choices with minA verbal cues. SLP Short Term Goal 3 (Week 2): Patient will be able to copy printed word with 100% accuracy and modA verbal and visual cues. SLP Short Term Goal 4 (Week 2): Patient will be able to name at least 5 items in a given category with modA verbal cues. SLP Short Term Goal 5 (Week 2): Patient will be able to use printed word and short phrase communciation boards to make requests, comments with minA to funcitonally utilize. SLP Short Term Goal 6 (Week 2): Patient will demonstrate awareness to phonemic and semantic errors during highly structured language tasks, with mod-maxA cues.  Skilled Therapeutic Interventions:Skilled ST services focused on language skills. SLP engaged pt in informal conversation, pt demonstrated 50-60% intelligibility due to semantic paraphasic errors. SLP facilitated utilized RCAB to focus on reading comprehension. The results are as following: Match word to picture in a field of 3 in 3/3 opportunities and with auditory confusion in 3/3 opportunities. Match word to picture in a field of 3, in semantic confusion task in 7 out 10 opportunities Match word to word (synonym) in a field of 3, in 8 out 10 opportunities. 10/10 with semantic and verbal cues Functional reading: in 9 out 10 opportunities with 40% semantic and verbal cues.  Sentence comprehension: in 9 out 10 opportunities Simple paragraphs: 2 out 4 opportunities and 3/4  opportunities with max A semantic and verbal cues Pt demonstrated increase in carryover of word finding, reading accuracy and reading comprehension skills throughout the session.  Pt was left in room with call bell within reach and chair alarm set. SLP recommends to continue skilled services.     Pain Pain Assessment Pain Scale: 0-10 Pain Score: 0-No pain  Therapy/Group: Individual Therapy  Nadezhda Pollitt  [facility] 06/30/2021, 10:15 AM

## 2021-07-01 NOTE — Progress Notes (Signed)
Patient ID: Wayne Mendez, male   DOB: 12-30-1965, 56 y.o.   MRN: [id number]  SW faxed short term disability forms to [facility] [employer] ([phone]).   Cecile Sheerer, MSW, LCSWA Office: [phone] Cell: [phone] Fax: [phone]

## 2021-07-01 NOTE — Progress Notes (Signed)
Physical Therapy Session Note  Patient Details  Name: Wayne Mendez MRN: [id number] Date of Birth: Nov 04, 1965  Today's Date: 07/01/2021 PT Individual Time: 1110-1205 PT Individual Time Calculation (min): 55 min   Short Term Goals: Week 2:  PT Short Term Goal 1 (Week 2): Pt will ambulation x 50 ft with mod A and LRAD PT Short Term Goal 2 (Week 2): Pt will maintain static standing balance with min A x 5 min PT Short Term Goal 3 (Week 2): Pt will initiate stair training as safe and able  Skilled Therapeutic Interventions/Progress Updates: Pt presented in w/c agreeable to therapy. Pt denies pain throughout session. Propelled to rehab gym with supervision and min cues for avoidance of [place] on R side of hallway. In rehab gym performed squat pivot transfer to L CGA. Participated in Sit to stand with RW and mirror feedback for forced use of RLE and progression of midline orientation. Pt able to power up with CGA but required midA for weight shifting to midline. Pt noted frequently to move L foot to attempt to find midline vs performing weight shift at trunk. PTA then blocking R knee pt worked on static standing with weight shifting to R and attempts at quad contraction. Pt would intermittently forward flex to compensate for decreased quad activation however pt with x 2 good contractions and able to get near full extension. After x 4 trials of this activity pt transferred to sidelying on L and participated in [place] vis forced use of RLE on powder board. Pt transferred to sidelying with supervision but required cues for placement of RUE. Pt participated in knee flexion/extension requiring AA for both motions but flexion>extension. Pt also performed AA hip flexion and AA hip extension 2 x 10 for all motions. Once completed pt returned to [facility] with supervision and performed squat pivot to R in w/c with CGA. Pt then propelled back to room in same manner as prior requiring cues for avoiding meal cart on R. Pt remained in  w/c at end of session with belt alarm on, call bell within reach and needs met.      Therapy Documentation Precautions:  Precautions Precautions: Fall Precaution Comments: Rt hemi Restrictions Weight Bearing Restrictions: No   Therapy/Group: Individual Therapy  Tiki Tucciarone Ladaysha Soutar, PTA  07/01/2021, 12:57 PM

## 2021-07-01 NOTE — Evaluation (Signed)
Recreational Therapy Assessment and Plan  Patient Details  Name: Wayne Mendez MRN: [id number] Date of Birth: February 19, 1966 Today's Date: 07/01/2021  Rehab Potential:  Good ELOS:   d/c 07/10/21  Assessment Hospital Problem: Principal Problem:   ICH (intracerebral hemorrhage) ([place])     Past Medical History:      Past Medical History:  Diagnosis Date   Hypertension      Past Surgical History:       Past Surgical History:  Procedure Laterality Date   TYMPANOSTOMY TUBE PLACEMENT          Assessment & Plan Clinical Impression: Patient is a 56 year old right-handed male with history of hypertension.  Per chart review patient lives with spouse.  1 level home.  Independent prior to admission.  Presented 06/15/2021 with acute onset of right side weakness and slurred speech.  Blood pressure 240/110.  CT/MRI shows intraparenchymal hemorrhage centered in the left basal ganglia extending into the left frontal and temporal lobes with mild surrounding edema and local mass-effect causing approximate 3 mm left-to-right midline shift.  Echocardiogram ejection fraction 65 to 70% no wall motion abnormalities.  Admission chemistries unremarkable glucose 123, alcohol negative, urine drug screen negative.  Neurology service follow-up conservative care close monitoring of blood pressure initially maintained on Cleviprex.  He was cleared to begin subcutaneous heparin for DVT prophylaxis 06/17/2021.  Tolerating a regular diet.  Therapy evaluations completed due to patient's right side weakness and slurred speech was admitted for a comprehensive rehab program.  Patient transferred to CIR on 06/19/2021.    Pt presents with decreased activity tolerance, decreased functional mobility, decreased balance, decreased coordination, decreased attention, decreased awareness, decreased problem solving, decreased safety awareness, and delayed processing, feelings of stress Limiting pt's independence with leisure/community  pursuits.  Met with pt today to discuss leisure interests, leisure education, activity analysis/modifications, role of social, emotional & spiritual health in addition to the physical and their impact on each other and overall wellness.  Also discuss coping strategies.  Pt appreciative of this visit and education.  Plan   Min 1 TR session >20 minutes during LOS  Recommendations for other services: None   Discharge Criteria: Patient will be discharged from TR if patient refuses treatment 3 consecutive times without medical reason.  If treatment goals not met, if there is a change in medical status, if patient makes no progress towards goals or if patient is discharged from hospital.  The above assessment, treatment plan, treatment alternatives and goals were discussed and mutually agreed upon: by patient  [place] 07/01/2021, 4:03 PM

## 2021-07-01 NOTE — Progress Notes (Signed)
Speech Language Pathology Daily Session Note  Patient Details  Name: Wayne Mendez MRN: [id number] Date of Birth: Mar 18, 1966  Today's Date: 07/01/2021 SLP Individual Time: 59-1400 SLP Individual Time Calculation (min): 27 min  Short Term Goals: Week 2: SLP Short Term Goal 1 (Week 2): Patient will be able to describe object/describe object function for common, everyday objects with modA verbal cues. SLP Short Term Goal 2 (Week 2): Patient will be able to match definition//description to corresponding object word in field of 4-6 choices with minA verbal cues. SLP Short Term Goal 3 (Week 2): Patient will be able to copy printed word with 100% accuracy and modA verbal and visual cues. SLP Short Term Goal 4 (Week 2): Patient will be able to name at least 5 items in a given category with modA verbal cues. SLP Short Term Goal 5 (Week 2): Patient will be able to use printed word and short phrase communciation boards to make requests, comments with minA to funcitonally utilize. SLP Short Term Goal 6 (Week 2): Patient will demonstrate awareness to phonemic and semantic errors during highly structured language tasks, with mod-maxA cues.  Skilled Therapeutic Interventions:Skilled ST services focused on language skills. Pt demonstrated ability to read and match function phrase to word given a field of 3 in 5/5 opportunities. Pt was able to respond to open ended questions in 2/5 opportunities increasing to 4/5 with max A sentence completion and phonemic cues. Pt was able to name actions in photographs in 7/10 opportunities increasing to 10/10 with sentence completion and phonetic cues. Pt became teary when asking questions about aphasia recovery, SLP provided education pertaining to neurological rehabilitation and emotional support. SLP provided "homework" at the request of the pt, to name items in room/family members in photos. All questions answered to satisfaction. Pt was left in room with call bell within  reach and chair alarm set. SLP recommends to continue skilled services.     Pain Pain Assessment Pain Score: 0-No pain  Therapy/Group: Individual Therapy  Zhuri Krass  [facility] 07/01/2021, 3:59 PM

## 2021-07-01 NOTE — Progress Notes (Signed)
Occupational Therapy Session Note  Patient Details  Name: Wayne Mendez MRN: [id number] Date of Birth: [birth year]  Today's Date: 07/01/2021 OT Individual Time: 0700-0809 OT Individual Time Calculation (min): 69 min    Short Term Goals: Week 2:  OT Short Term Goal 1 (Week 2): Pt will complete 1/3 components of toileting with no more than Mod balance assistance OT Short Term Goal 2 (Week 2): Pt will complete 2 grooming tasks while standing at the sink using RW for standing support as needed OT Short Term Goal 3 (Week 2): Pt will perform toilet tranfser with min A and min verbal cues for sequencing and safety  Skilled Therapeutic Interventions/Progress Updates:    Pt resting in bed upon arrival. OT intervention with focus on BADL retraining, functional transfers, standing balance, sitting balance, attention to Rt, and safety awareness to increase independence with BADLs. All squat pivot transfers this morning with min A. Pt noted with less inpulsivity during transers this morning. Bathing with min A standing in shower with mod verbal cues for safety. Pt issued elastic shoe laces to facilitate LB dressing tasks. Standing balance with min A and mod verbal cues for safety and attention to RLE prior to standing. Pt provided info on purchasing elastic shoe laces. W/c mobility with supervisoin and min verbal cues for attention to Rt. Pt remained in w/c with all needs within reach. Belt alarm activated.  Therapy Documentation Precautions:  Precautions Precautions: Fall Precaution Comments: Rt hemi Restrictions Weight Bearing Restrictions: No Pain: Pain Assessment Pain Scale: 0-10 Pain Score: 0-No pain   Therapy/Group: Individual Therapy  Rich Brave 07/01/2021, 8:14 AM

## 2021-07-01 NOTE — Progress Notes (Signed)
Occupational Therapy Session Note  Patient Details  Name: Wayne Mendez MRN: [id number] Date of Birth: 11/13/1965  Today's Date: 07/01/2021 OT Individual Time: 7353-2992 OT Individual Time Calculation (min): 45 min    Short Term Goals: Week 2:  OT Short Term Goal 1 (Week 2): Pt will complete 1/3 components of toileting with no more than Mod balance assistance OT Short Term Goal 2 (Week 2): Pt will complete 2 grooming tasks while standing at the sink using RW for standing support as needed OT Short Term Goal 3 (Week 2): Pt will perform toilet tranfser with min A and min verbal cues for sequencing and safety  Skilled Therapeutic Interventions/Progress Updates:  Pt greeted seated in w/c agreeable to OT intervention. ADL needs had been met, session focus on RUE AAROM. Pt completed w/c propulsion to gym with supervision. Worked  on [employer] with RUE positioned on slanted stool with pt instructed to push stool forward backwards, MOD cues needed remember to stabilize trunk as pt compensating by using trunk as times, activated completed with elbow supported and slighted ABD. Provided mirror for visual feedback. Pt additional worked on rolling ball forward/backwards as well as R and L to promote active assist scapular protraction/retraction and horizontal ABD/ADD, noted trace movement for all AAROM therapeutic activities. Worked on [employer] with pt completing elbow flexion/extension with weighted ball with hand over hand assist provided for RUE, also worked on supination/pronation with weighted ball with pt needed hand over hand assist to grasp ball. Secured pts RUE to unweighted dowel with pt completed 2x10 elbow flexion/extension, chest presses and forward rows to promote improved AROM of RUE. Pt completed w/c propulsion back to room with supervision with pt left up in w/c with alarm belt activated and all needs within reach.                      Therapy Documentation Precautions:   Precautions Precautions: Fall Precaution Comments: Rt hemi Restrictions Weight Bearing Restrictions: No  Pain: no pain reported during session     Therapy/Group: Individual Therapy  Precious Haws 07/01/2021, 12:13 PM

## 2021-07-01 NOTE — Plan of Care (Signed)
°  Problem: RH Bathing Goal: LTG Patient will bathe all body parts with assist levels (OT) Description: LTG: Patient will bathe all body parts with assist levels (OT) Flowsheets (Taken 07/01/2021 1408) LTG: Pt will perform bathing with assistance level/cueing: (downgraded JLS) Minimal Assistance - Patient > 75% Note: downgraded JLS assistance in standing position for bathing backside   Problem: RH Dressing Goal: LTG Patient will perform lower body dressing w/assist (OT) Description: LTG: Patient will perform lower body dressing with assist, with/without cues in positioning using equipment (OT) Flowsheets (Taken 07/01/2021 1408) LTG: Pt will perform lower body dressing with assistance level of: (downgraded JLS) Contact Guard/Touching assist Note: downgraded JLS; for standing balance    Problem: RH Toileting Goal: LTG Patient will perform toileting task (3/3 steps) with assistance level (OT) Description: LTG: Patient will perform toileting task (3/3 steps) with assistance level (OT)  Flowsheets (Taken 07/01/2021 1408) LTG: Pt will perform toileting task (3/3 steps) with assistance level: (downgraded JLS) Moderate Assistance - Patient 50 - 74% Note: downgraded JLS; for hygiene    Problem: RH Tub/Shower Transfers Goal: LTG Patient will perform tub/shower transfers w/assist (OT) Description: LTG: Patient will perform tub/shower transfers with assist, with/without cues using equipment (OT) Flowsheets (Taken 07/01/2021 1408) LTG: Pt will perform tub/shower stall transfers with assistance level of: (downgraded JLS) Contact Guard/Touching assist Note: downgraded JLS

## 2021-07-01 NOTE — Progress Notes (Signed)
PROGRESS NOTE   Subjective/Complaints: No issues overnite , seen in PT, no c/os ROS:   Pt denies SOB, abd pain, CP, N/V/C/D, and vision changes     Objective:   No results found. Recent Labs    06/29/21 0506  WBC 6.7  HGB 13.3  HCT 39.6  PLT 188     Recent Labs    06/29/21 0506  NA 140  K 3.7  CL 101  CO2 30  GLUCOSE 104*  BUN 17  CREATININE 1.16  CALCIUM 9.0      Intake/Output Summary (Last 24 hours) at 07/01/2021 1233 Last data filed at 07/01/2021 0700 Gross per 24 hour  Intake 600 ml  Output 600 ml  Net 0 ml         Physical Exam: Vital Signs Blood pressure 121/73, pulse 72, temperature 99.4 F (37.4 C), resp. rate 16, height 6' (1.829 m), SpO2 96 %.   General: No acute distress Mood and affect are appropriate Heart: Regular rate and rhythm no rubs murmurs or extra sounds Lungs: Clear to auscultation, breathing unlabored, no rales or wheezes Abdomen: Positive bowel sounds, soft nontender to palpation, nondistended Extremities: No clubbing, cyanosis, or edema Skin: No evidence of breakdown, no evidence of rash MSK- no pain with shoulder ROM     Mental Status: He is alert.     Comments: Alert, anomic aphasia but good comprehension,  Right central 7 and tongue deviation. Right arm and leg 1/2 sensation for LT and PP. RUE trace deltoid but otherwise 0/5.  RLE 1/5 HE, HAD, trace to absent distally. DTR's trace. No resting tone. LUE and LLE 5/5 with normal sensation  Psychiatric:        Mood and Affect: Mood normal.        Behavior: Behavior normal.      Assessment/Plan: 1. Functional deficits which require 3+ hours per day of interdisciplinary therapy in a comprehensive inpatient rehab setting. Physiatrist is providing close team supervision and 24 hour management of active medical problems listed below. Physiatrist and rehab team continue to assess barriers to discharge/monitor patient  progress toward functional and medical goals  Care Tool:  Bathing    Body parts bathed by patient: Right arm, Left arm, Chest, Abdomen, Front perineal area, Right upper leg, Left upper leg, Right lower leg, Left lower leg, Face   Body parts bathed by helper: Buttocks     Bathing assist Assist Level: Minimal Assistance - Patient > 75%     Upper Body Dressing/Undressing Upper body dressing   What is the patient wearing?: Pull over shirt    Upper body assist Assist Level: Supervision/Verbal cueing    Lower Body Dressing/Undressing Lower body dressing      What is the patient wearing?: Underwear/pull up, Pants     Lower body assist Assist for lower body dressing: Minimal Assistance - Patient > 75%     Toileting Toileting Toileting Activity did not occur (Clothing management and hygiene only): N/A (no void or bm)  Toileting assist Assist for toileting: Total Assistance - Patient < 25% Assistive Device Comment: with urinal in bed   Transfers Chair/bed transfer  Transfers assist  Chair/bed transfer assist level: Minimal Assistance - Patient > 75%     Locomotion Ambulation   Ambulation assist   Ambulation activity did not occur: Safety/medical concerns  Assist level: 2 helpers Assistive device: Other (comment) (rail in hallway) Max distance: 30'   Walk 10 feet activity   Assist  Walk 10 feet activity did not occur: Safety/medical concerns  Assist level: 2 helpers Assistive device: Other (comment) (rail in hallway)   Walk 50 feet activity   Assist Walk 50 feet with 2 turns activity did not occur: Safety/medical concerns  Assist level: Dependent - Patient 0% Assistive device: Lite Gait    Walk 150 feet activity   Assist Walk 150 feet activity did not occur: Safety/medical concerns  Assist level: Dependent - Patient 0% Assistive device: Lite Gait    Walk 10 feet on uneven surface  activity   Assist Walk 10 feet on uneven surfaces  activity did not occur: Safety/medical concerns         Wheelchair     Assist Is the patient using a wheelchair?: Yes Type of Wheelchair: Manual    Wheelchair assist level: Supervision/Verbal cueing Max wheelchair distance: 150    Wheelchair 50 feet with 2 turns activity    Assist        Assist Level: Supervision/Verbal cueing   Wheelchair 150 feet activity     Assist      Assist Level: Supervision/Verbal cueing   Blood pressure 121/73, pulse 72, temperature 99.4 F (37.4 C), resp. rate 16, height 6' (1.829 m), SpO2 96 %.    Medical Problem List and Plan: 1. 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  Impaired mobility: continue Heparin  1/5- since been a week, will switch to Lovenox.              -antiplatelet therapy: N/A 3. Pain Management: Tylenol as needed 4. Mood: Provide emotional support             -antipsychotic agents: N/A 5. Neuropsych: This patient is? capable of making decisions on his own behalf. 6. Skin/Wound Care: Routine skin checks 7. Fluids/Electrolytes/Nutrition: Routine in and outs with follow-up chemistries 8.  Hypertension.  Norvasc 10 mg daily, lisinopril 20 mg twice daily, increase lopressor to 100mg  BID.    Vitals:   07/01/21 0450 07/01/21 0808  BP: 119/81 121/73  Pulse: 64 72  Resp: 16   Temp: 99.4 F (37.4 C)   SpO2: 96%   Well controlled 1/11  9.  Hyperlipidemia.  Hold due to IPH.  -Consider low dose statin at discharge 10. AKI: placed nursing order to encourage 6-8 glasses of water per day, resolved on repeat,  continue to monitor on Mondays  1/6- will recheck Monday-con't to push fluids PO  11. Suboptimal potassium: supplement 09-17-1971 today and repeat K+ tomorrow  1/6- will recheck Monday 12. Insomnia: melatonin 5mg  and grounds pass ordered to help reset circadian  rhythm. 1/8- sleeping great with trazodone - will need to take home with him 13. New spasticity  RIght pectoralis with increasing tone , no pain with ROM          LOS: 12 days A FACE TO FACE EVALUATION WAS PERFORMED  Erick Colacendrew E Nani Ingram 07/01/2021, 12:33 PM

## 2021-07-01 NOTE — Plan of Care (Signed)
°  Problem: RH Pre-functional/Other (Specify) Goal: RH LTG OT (Specify) 1 Description: RH LTG OT (Specify) 1 Flowsheets (Taken 07/01/2021 1412) LTG: Other OT (Specify) 1: Pt will attend to right body and environment during functional activities with min verbal cues

## 2021-07-02 DIAGNOSIS — R4701 Aphasia: Secondary | ICD-10-CM

## 2021-07-02 DIAGNOSIS — N179 Acute kidney failure, unspecified: Secondary | ICD-10-CM

## 2021-07-02 DIAGNOSIS — G479 Sleep disorder, unspecified: Secondary | ICD-10-CM

## 2021-07-02 NOTE — Progress Notes (Signed)
PROGRESS NOTE   Subjective/Complaints: Pt in good spirits. No new complaints. Continues to work hard in therapy  ROS: Patient denies fever, rash, sore throat, blurred vision, nausea, vomiting, diarrhea, cough, shortness of breath or chest pain, joint or back pain, headache, or mood change.      Objective:   No results found. No results for input(s): WBC, HGB, HCT, PLT in the last 72 hours.  No results for input(s): NA, K, CL, CO2, GLUCOSE, BUN, CREATININE, CALCIUM in the last 72 hours.   Intake/Output Summary (Last 24 hours) at 07/02/2021 1111 Last data filed at 07/02/2021 0700 Gross per 24 hour  Intake 840 ml  Output --  Net 840 ml        Physical Exam: Vital Signs Blood pressure 113/82, pulse 69, temperature 98.3 F (36.8 C), temperature source Oral, resp. rate 16, height 6' (1.829 m), SpO2 96 %.   Constitutional: No distress . Vital signs reviewed. HEENT: NCAT, EOMI, oral membranes moist Neck: supple Cardiovascular: RRR without murmur. No JVD    Respiratory/Chest: CTA Bilaterally without wheezes or rales. Normal effort    GI/Abdomen: BS +, non-tender, non-distended Ext: no clubbing, cyanosis, or edema Psych: pleasant and cooperative  Skin: No evidence of breakdown, no evidence of rash MSK- no pain with shoulder ROM     Mental Status: He is alert.     Comments: word finding deficits but can put together phrases and convey thoughts with extra time,  Right central 7 and tongue deviation. Right arm and leg 1/2 sensation for LT and PP. RUE tr/5 deltoid but otherwise 0/5.  RLE 1/5 HE, HAD, trace to absent distally. DTR's trace. No resting tone. LUE and LLE 5/5 with normal sensation         Assessment/Plan: 1. Functional deficits which require 3+ hours per day of interdisciplinary therapy in a comprehensive inpatient rehab setting. Physiatrist is providing close team supervision and 24 hour management of active  medical problems listed below. Physiatrist and rehab team continue to assess barriers to discharge/monitor patient progress toward functional and medical goals  Care Tool:  Bathing    Body parts bathed by patient: Right arm, Left arm, Chest, Abdomen, Front perineal area, Right upper leg, Left upper leg, Right lower leg, Left lower leg, Face   Body parts bathed by helper: Buttocks     Bathing assist Assist Level: Minimal Assistance - Patient > 75%     Upper Body Dressing/Undressing Upper body dressing   What is the patient wearing?: Pull over shirt    Upper body assist Assist Level: Supervision/Verbal cueing    Lower Body Dressing/Undressing Lower body dressing      What is the patient wearing?: Underwear/pull up, Pants     Lower body assist Assist for lower body dressing: Minimal Assistance - Patient > 75%     Toileting Toileting Toileting Activity did not occur (Clothing management and hygiene only): N/A (no void or bm)  Toileting assist Assist for toileting: Total Assistance - Patient < 25% Assistive Device Comment: with urinal in bed   Transfers Chair/bed transfer  Transfers assist     Chair/bed transfer assist level: Minimal Assistance - Patient > 75%  Locomotion Ambulation   Ambulation assist   Ambulation activity did not occur: Safety/medical concerns  Assist level: 2 helpers Assistive device: Other (comment) (rail in hallway) Max distance: 30'   Walk 10 feet activity   Assist  Walk 10 feet activity did not occur: Safety/medical concerns  Assist level: 2 helpers Assistive device: Other (comment) (rail in hallway)   Walk 50 feet activity   Assist Walk 50 feet with 2 turns activity did not occur: Safety/medical concerns  Assist level: Dependent - Patient 0% Assistive device: Lite Gait    Walk 150 feet activity   Assist Walk 150 feet activity did not occur: Safety/medical concerns  Assist level: Dependent - Patient 0% Assistive  device: Lite Gait    Walk 10 feet on uneven surface  activity   Assist Walk 10 feet on uneven surfaces activity did not occur: Safety/medical concerns         Wheelchair     Assist Is the patient using a wheelchair?: Yes Type of Wheelchair: Manual    Wheelchair assist level: Supervision/Verbal cueing Max wheelchair distance: 150    Wheelchair 50 feet with 2 turns activity    Assist        Assist Level: Supervision/Verbal cueing   Wheelchair 150 feet activity     Assist      Assist Level: Supervision/Verbal cueing   Blood pressure 113/82, pulse 69, temperature 98.3 F (36.8 C), temperature source Oral, resp. rate 16, height 6' (1.829 m), SpO2 96 %.    Medical Problem List and Plan: 1. 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  Impaired mobility:   Lovenox.              -antiplatelet therapy: N/A 3. Pain Management: Tylenol as needed 4. Mood: Provide emotional support             -antipsychotic agents: N/A 5. Neuropsych: This patient is? capable of making decisions on his own behalf. 6. Skin/Wound Care: Routine skin checks 7. Fluids/Electrolytes/Nutrition: Routine in and outs with follow-up chemistries 8.  Hypertension.  Norvasc 10 mg daily, lisinopril 20 mg twice daily, increase lopressor to 100mg  BID.    Vitals:   07/01/21 2012 07/02/21 0403  BP: 122/68 113/82  Pulse: 74 69  Resp: 15 16  Temp: 98.3 F (36.8 C) 98.3 F (36.8 C)  SpO2: 97% 96%  Well controlled 1/12  9.  Hyperlipidemia.  Hold due to IPH.   -Consider low dose statin at discharge 10. AKI: placed nursing order to encourage 6-8 glasses of water per day, resolved on repeat, continue to monitor on Mondays  1/9 Cr down to 1.16 11. Suboptimal potassium:    1/9 3.7, supplement with diet 12. Insomnia: melatonin 5mg  and grounds pass ordered to help reset circadian rhythm. 1/9- trazodone effective 13. New spasticity  RIght pectoralis with increasing tone is minimal to absent currently  LOS: 13 days A FACE TO FACE EVALUATION WAS PERFORMED  Ranelle OysterZachary T Kysha Muralles 07/02/2021, 11:11 AM

## 2021-07-02 NOTE — Progress Notes (Signed)
Occupational Therapy Session Note  Patient Details  Name: Wayne Mendez MRN: [id number] Date of Birth: [birth year]  Today's Date: 07/02/2021 OT Individual Time: 5329-9242 OT Individual Time Calculation (min): 42 min    Short Term Goals: Week 2:  OT Short Term Goal 1 (Week 2): Pt will complete 1/3 components of toileting with no more than Mod balance assistance OT Short Term Goal 2 (Week 2): Pt will complete 2 grooming tasks while standing at the sink using RW for standing support as needed OT Short Term Goal 3 (Week 2): Pt will perform toilet tranfser with min A and min verbal cues for sequencing and safety  Skilled Therapeutic Interventions/Progress Updates:    Pt resting in bed upon arrival. OT intervention with focus on bed mobility, sitting balance, functional tranfsers, bathing at shower level, dressing with sit<>stand from w/c at sink, w/c mobility, attention to Rt, and safety awareness to increase independence with BADLs. Supine>sit EOB with supervision using bed functions. Squat pivot tranfser to w/c with CGA. Squat pivot transfer w/c<>TTB with min A and mod verbal cues for safety awareness. Bathing with min A to bathe buttocks when standing. UB dressing with supervision. LB dressing with assistance to don Ted hose and min A when standing at sink to pull pants over hips. W/c mobility in hallway with supervision and min verbal cues to attend to Rt. Pt issued home measurement sheet to give to wife. Pt demo decreased impulsivity but continues to required min/mod verbal cues for safety and attend to Rt and RUE. Pt remained in w/c with belt alarm activated. Half lap tray in place. All needs within reach.  Therapy Documentation Precautions:  Precautions Precautions: Fall Precaution Comments: Rt hemi Restrictions Weight Bearing Restrictions: No   Pain:  Pt denies pain this morning  Therapy/Group: Individual Therapy  Rich Brave 07/02/2021, 8:13 AM

## 2021-07-02 NOTE — Progress Notes (Signed)
Speech Language Pathology Daily Session Note  Patient Details  Name: Wayne Mendez MRN: [id number] Date of Birth: [birth year]  Today's Date: 07/02/2021 SLP Individual Time: 1405-1500 SLP Individual Time Calculation (min): 55 min  Short Term Goals: Week 2: SLP Short Term Goal 1 (Week 2): Patient will be able to describe object/describe object function for common, everyday objects with modA verbal cues. SLP Short Term Goal 2 (Week 2): Patient will be able to match definition//description to corresponding object word in field of 4-6 choices with minA verbal cues. SLP Short Term Goal 3 (Week 2): Patient will be able to copy printed word with 100% accuracy and modA verbal and visual cues. SLP Short Term Goal 4 (Week 2): Patient will be able to name at least 5 items in a given category with modA verbal cues. SLP Short Term Goal 5 (Week 2): Patient will be able to use printed word and short phrase communciation boards to make requests, comments with minA to funcitonally utilize. SLP Short Term Goal 6 (Week 2): Patient will demonstrate awareness to phonemic and semantic errors during highly structured language tasks, with mod-maxA cues.  Skilled Therapeutic Interventions:   Patient seen for skilled ST session focused on expressive aphasia goals. Spouse and son present at very beginning of session but left  before we started structured tasks. Spouse requesting patient rest after ST session as he has been working extra hard today (verified by PT note) and he has been tired. Patient able to converse more effectively with SLP during unstructured conversation, telling SLP about growing up in [facility] and talking about his father's job. He was able to describe verb/action photos at phrase level with 85% accuracy and described nouns (printed word presentation) with use of strategy SLP showed him using voice command search on his tablet and using information to help him when he is stuck trying to think of a  specific word. He was able to demonstrate improved verbal expression when using visual/printed word cues to narrow down more on category, etc of word he wsa thinking of.  Patient expressed he likes to try to not get help when speaking but SLP advised him to have this available to use before he starts getting frustrated and that it will also help him to focus. SLP also stressed importance of first describing at basic level before trying to give more detail. Patient acknowledges that he has trouble seeing all the progress he has made at times because he is very hard on himself. We discussed taking breaks to rest and recharge. Patient left in [facility] with all needs within reach. He continues to benefit from skilled SLP intervention to maximize language function prior to discharge.  Pain Pain Assessment Pain Scale: 0-10 Pain Score: 0-No pain  Therapy/Group: Individual Therapy  Angela Nevin, MA, CCC-SLP Speech Therapy

## 2021-07-02 NOTE — Progress Notes (Signed)
Physical Therapy Session Note  Patient Details  Name: Wayne Mendez MRN: [id number] Date of Birth: 1965-09-10  Today's Date: 07/02/2021 PT Co-Treatment Time: 4098-1191 PT Co-Treatment Time Calculation (min): 35mn  Short Term Goals: Week 2:  PT Short Term Goal 1 (Week 2): Pt will ambulation x 50 ft with mod A and LRAD PT Short Term Goal 2 (Week 2): Pt will maintain static standing balance with min A x 5 min PT Short Term Goal 3 (Week 2): Pt will initiate stair training as safe and able  Skilled Therapeutic Interventions/Progress Updates: Pt presented in w/c agreeable to therapy. Session was a co-treat with PT with focus on gait training on treadmill. Pt propelled to day room with supervision and pt able to stand with minA holding Lite gait while being set up in harness. Once on treadmill pt lifted enough to decrease full body weight support. Pt ambulated 4:22 at 0.532m for 11554fPt required maxA for RLE advancement, facilitation for weight shifting to R, and multimodal cues for increasing L foot advancement. Pt unable to fully extend RLE instance phase and able to push through blocking from both PT and PTA. On second and third bouts speed decreased to 0.2mp109mo allow more time to process components of gait with pt intermittently able to advance RLE, intermittent improved weight shift but continued to be unable to maintain full extension in stance. Pt completed a total of 12:35 for a total of [id number] completed pt moved to hallway to participate in gait training at wall 30ft 40fwith w/c follow, mirror feedback and modA. Pt continues to require multimodal cues to weight shift and blocking of R knee in stance phase. Pt propelled back to room at end of session and remained in w/c with belt alarm on, call bell within reach and needs met.      Therapy Documentation Precautions:  Precautions Precautions: Fall Precaution Comments: Rt hemi Restrictions Weight Bearing Restrictions: No General:    Vital Signs: Therapy Vitals Temp: 98.1 F (36.7 C) Pulse Rate: 70 Resp: 18 BP: 108/76 Patient Position (if appropriate): Sitting Oxygen Therapy SpO2: 100 % O2 Device: Room Air    Therapy/Group: Individual Therapy  Devonna Oboyle Zebulan Hinshaw, PTA  07/02/2021, 4:13 PM

## 2021-07-02 NOTE — Progress Notes (Signed)
Physical Therapy Session Note  Patient Details  Name: Wayne Mendez MRN: [id number] Date of Birth: 06-May-1966  Today's Date: 07/02/2021 PT Co-Treatment Time: [id number] PT Co-Treatment Time Calculation (min): 30 min  Short Term Goals: Week 2:  PT Short Term Goal 1 (Week 2): Pt will ambulation x 50 ft with mod A and LRAD PT Short Term Goal 2 (Week 2): Pt will maintain static standing balance with min A x 5 min PT Short Term Goal 3 (Week 2): Pt will initiate stair training as safe and able  Skilled Therapeutic Interventions/Progress Updates:    Cotreatment session with PTA with focus on gait training and NMR of RLE. Pt received seated in w/c in room, agreeable to PT session. No complaints of pain. Manual w/c propulsion to/from therapy gym at Supervision level with use of L UE/LE with cues to attend to obstacles in R visual field. Sit to stand with min A to LiteGait. Pt is able to maintain standing balance x 5 min with min A while LiteGait harness donned. Pt able to step up onto treadmill while in harness with assist x 2 and cues for sequencing. Ambulation up to 259 ft total on LiteGait initially at 0.3 mph with decrease to 0.2 mph for increased time to complete steps and weight-shifting. Applied DF assist ACE wrap to RLE to assist with limb clearance during gait. Pt requires assist to advance RLE but does exhibit improved ability to advance limb and some quad activation in stance. Pt does continue to require heavy blocking of limb in extension to prevent buckling. Pt also requires assist for R lateral and anterior weight shift during gait. Ambulation x 30 ft with L handrail with mod A with w/c follow for safety with mirror for visual feedback. Pt continues to struggle to weight shift onto RLE during gait even with increased manual assist and verbal cueing. Pt left seated in w/c in room with needs in reach, quick release belt and chair alarm in place at end of session.  Therapy Documentation Precautions:   Precautions Precautions: Fall Precaution Comments: Rt hemi Restrictions Weight Bearing Restrictions: No    Therapy/Group: Co-Treatment   Excell Seltzer, PT, DPT, CSRS  07/02/2021, 3:16 PM

## 2021-07-02 NOTE — Progress Notes (Signed)
Physical Therapy Session Note  Patient Details  Name: Wayne Mendez MRN: [id number] Date of Birth: 20-Mar-1966  Today's Date: 07/02/2021 PT Individual Time: 2395-3202 PT Individual Time Calculation (min): 25 min   Short Term Goals: Week 1:  PT Short Term Goal 1 (Week 1): [facility] assist PT Short Term Goal 1 - Progress (Week 1): Met PT Short Term Goal 2 (Week 1): Pt will ambulate 89f with mod assist and LRAD PT Short Term Goal 2 - Progress (Week 1): Progressing toward goal PT Short Term Goal 3 (Week 1): Pt will propell WC 1564fwith supevision assist PT Short Term Goal 3 - Progress (Week 1): Met PT Short Term Goal 4 (Week 1): pt will perform bed mobility with min assist 50% of trials PT Short Term Goal 4 - Progress (Week 1): Met Week 2:  PT Short Term Goal 1 (Week 2): Pt will ambulation x 50 ft with mod A and LRAD PT Short Term Goal 2 (Week 2): Pt will maintain static standing balance with min A x 5 min PT Short Term Goal 3 (Week 2): Pt will initiate stair training as safe and able  Skilled Therapeutic Interventions/Progress Updates:  Pt received seated in WC in room, denied pain but reported significant fatigue following earlier PT session. Emphasis of session on gait assessment and pt education. Pt and therapist watched recorded video of pt walking w/LiteGait and pt had several questions regarding why his gait was so "wonky". Session spent educating pt on current gait pattern and reasoning behind his impairments. Provided exercises pt could work on in between therapy session for improved quad activation, including supine quad sets and seated LAQ. Encouraged pt to visualize his leg moving while performing exercises and pt verbalized understanding. Pt was left seated in WC in room, all needs in reach.   Therapy Documentation Precautions:  Precautions Precautions: Fall Precaution Comments: Rt hemi Restrictions Weight Bearing Restrictions:  No   Therapy/Group: Individual Therapy JaCruzita Ledererlaster, PT, DPT  07/02/2021, 7:58 AM

## 2021-07-02 NOTE — Progress Notes (Signed)
Patient ID: Wayne Mendez, male   DOB: 07-Oct-1965, 56 y.o.   MRN: [id number]  SW ordered RW and w/c with [employer] via parachute.   SW provided pt with completed short term disability forms and informed on DME being ordered: RW and w/c.

## 2021-07-03 NOTE — Progress Notes (Signed)
Occupational Therapy Weekly Progress Note  Patient Details  Name: Wayne Mendez MRN: [id number] Date of Birth: 10/23/1965  Beginning of progress report period: June 25, 2021 End of progress report period: July 03, 2021  Patient has met 3 of 3 short term goals.  Pt continues to make slow but steady progress towards LTG. Bathing and toileting goals have been downgraded, in addtition to LB dressing. Pt currently requires min/mod A for standing balance when engaged in functional tasks. Pt's impulsivity during transfers and sit<>stands has improved but he continues to required min verbal cues for safety awareness. Pt requiers mod A for attention to his RUE and environment when propelling w/c.   Patient continues to demonstrate the following deficits: muscle weakness, decreased cardiorespiratoy endurance and decreased oxygen support, impaired timing and sequencing, unbalanced muscle activation, and decreased coordination, decreased midline orientation, decreased attention and decreased awareness, and decreased standing balance and decreased postural control and therefore will continue to benefit from skilled OT intervention to enhance overall performance with BADL and Reduce care partner burden.  Patient progressing toward long term goals..  Continue plan of care.  OT Short Term Goals Week 2:  OT Short Term Goal 1 (Week 2): Pt will complete 1/3 components of toileting with no more than Mod balance assistance OT Short Term Goal 1 - Progress (Week 2): Met OT Short Term Goal 2 (Week 2): Pt will complete 2 grooming tasks while standing at the sink using RW for standing support as needed OT Short Term Goal 2 - Progress (Week 2): Met OT Short Term Goal 3 (Week 2): Pt will perform toilet tranfser with min A and min verbal cues for sequencing and safety OT Short Term Goal 3 - Progress (Week 2): Met Week 3:  OT Short Term Goal 1 (Week 3): STG=LTG 2/2 ELOS (continue working towards min A/supervsion  LTG)          Leroy Libman 07/03/2021, 6:47 AM

## 2021-07-03 NOTE — Progress Notes (Signed)
Speech Language Pathology Weekly Progress and Session Note  Patient Details  Name: Wayne Mendez MRN: [id number] Date of Birth: [birth year]  Beginning of progress report period: 06/26/2021 End of progress report period: 07/03/2021  Today's Date: 07/03/2021 SLP Individual Time: 1000-1030 SLP Individual Time Calculation (min): 30 min  Short Term Goals: Week 2: SLP Short Term Goal 1 (Week 2): Patient will be able to describe object/describe object function for common, everyday objects with modA verbal cues. SLP Short Term Goal 1 - Progress (Week 2): Met SLP Short Term Goal 2 (Week 2): Patient will be able to match definition//description to corresponding object word in field of 4-6 choices with minA verbal cues. SLP Short Term Goal 2 - Progress (Week 2): Met SLP Short Term Goal 3 (Week 2): Patient will be able to copy printed word with 100% accuracy and modA verbal and visual cues. SLP Short Term Goal 3 - Progress (Week 2): Discontinued (comment) (have focused on verbal language as patient having difficulty just with writing with non-dominant hand) SLP Short Term Goal 4 (Week 2): Patient will be able to name at least 5 items in a given category with modA verbal cues. SLP Short Term Goal 4 - Progress (Week 2): Met SLP Short Term Goal 5 (Week 2): Patient will be able to use printed word and short phrase communciation boards to make requests, comments with minA to funcitonally utilize. SLP Short Term Goal 5 - Progress (Week 2): Met SLP Short Term Goal 6 (Week 2): Patient will demonstrate awareness to phonemic and semantic errors during highly structured language tasks, with mod-maxA cues. SLP Short Term Goal 6 - Progress (Week 2): Met    New Short Term Goals: Week 3: SLP Short Term Goal 1 (Week 3): STG=LTG (ELOS: discharge planned 1/20)  Weekly Progress Updates:  Patient continues to steadily progress and met all 6 STG's. He continues to be on track for achieving LTG's by planned discharge date of  1/20, but will benefit from OP SLP upon discharge to continue with progression. Patient is demonstrating improved awareness, improved verbal fluency and ability to converse at basic conversational level. He continues to struggle with word finding, but he has also demonstrated significant improvements with this as well. He is expected to continue to improve with continued SLP intervention to maximize his language abilities prior to discharge.   Intensity: Minumum of 1-2 x/day, 30 to 90 minutes Frequency: 3 to 5 out of 7 days Duration/Length of Stay: 1/20 Treatment/Interventions: Speech/Language facilitation;Dysphagia/aspiration precaution training;Cognitive remediation/compensation;Environmental controls;Functional tasks;Multimodal communication approach;Patient/family education   Daily Session  Skilled Therapeutic Interventions: Patient seen for skilled ST session focused on expressive language goals. Patient demonstrated improved ability to converse with SLP, talking about his son's recent acceptance into college. Verbal fluency at sentence, phrase and conversational level has improved and patient is more successful with word finding and self-cueing, self-correcting word errors. He continues to benefit from verbal cues to first describe at basic level before attempting to elaborate, when describing verb/action photos. SLP wrote down recommendation of [place] which should be helpful for him with his word finding errors.     General    Pain Pain Assessment Pain Scale: 0-10 Pain Score: 0-No pain  Therapy/Group: Individual Therapy  Sonia Baller, MA, CCC-SLP Speech Therapy

## 2021-07-03 NOTE — Progress Notes (Addendum)
Occupational Therapy Session Note  Patient Details  Name: Wayne Mendez MRN: [id number] Date of Birth: [birth year]  Today's Date: 07/03/2021 OT Individual Time: 3893-7342 OT Individual Time Calculation (min): 75 min    Short Term Goals: Week 3:  OT Short Term Goal 1 (Week 3): STG=LTG 2/2 ELOS (continue working towards min A/supervsion LTG)  Skilled Therapeutic Interventions/Progress Updates:    Pt resting in bed upon arrival. OT intervention with focus on bed mobility, standing balance, BADL retraining, functional tranfsers, and safety awarness. Supine>sit EOB with supervision and extra effort 2/2 incorrect technique. Discussed with pt that later in session the focus will shift to bed mobility. Toilet transfer with CGA using hand rail. Mod A for toileting. Squat pivot transfer to TTB with min A and mod verbal cues. Bathing with min A sit<>stand in shower. Dressing with min A for standing balance to pull pants over hips. Pt transitioned to gym and then ADL apartment to practice sit<>supine EOB with correct technique. Pt completes task with supervision after demonstration and with min  verbal cues. Pt agreed that proper technique is much easier and conserves energy. Pt returned to room and reamined in recliner. Belt alarm activated. All needs within reach.   Therapy Documentation Precautions:  Precautions Precautions: Fall Precaution Comments: Rt hemi Restrictions Weight Bearing Restrictions: No   Pain:  Pt denies pain this morning  Therapy/Group: Individual Therapy  Rich Brave 07/03/2021, 8:18 AM

## 2021-07-03 NOTE — Progress Notes (Signed)
Recreational Therapy Session Note  Patient Details  Name: Wayne Mendez MRN: [id number] Date of Birth: 12-16-1965 Today's Date: 07/03/2021 Time:  11-12 Pain: no c/o Skilled Therapeutic Interventions/Progress Updates: Pt participated in stress managment/coping group today.  Pt education/discussion focused on stress exploration including factors that contribute to stress, factors that protect against stress and potential coping strategies.  Coping strategies included deep breathing, progressive muscle relaxation, imagery & challenging irrational thoughts.  Handouts provided.  Pt appreciative of this group and education provided.  Therapy/Group: Group Therapy  Emmajean Ratledge 07/03/2021, 12:43 PM

## 2021-07-03 NOTE — Progress Notes (Signed)
Occupational Therapy Session Note  Patient Details  Name: Wayne Mendez MRN: [id number] Date of Birth: May 07, 1966  Today's Date: 07/03/2021 OT Group Time: 1100-1200 OT Group Time Calculation (min): 60 min   Short Term Goals: Week 3:  OT Short Term Goal 1 (Week 3): STG=LTG 2/2 ELOS (continue working towards min A/supervsion LTG)  Skilled Therapeutic Interventions/Progress Updates:  Pt participated in group session with a focus on stress mgmt, education on healthy coping strategies, and social interaction. Focus of session on providing coping strategies to manage new current level of function as a result of new diagnosis.  Session focus on breaking down stressors into daily hassles, major life stressors and life circumstances in an effort to allow pts to chunk their stressors into groups. Pt actively sharing stressors and contributing to group conversation. Provided active listening, emotional support and therapeutic use of self. Offered education on factors that protect [place] against stress such as daily uplifts, healthy coping strategies and protective factors. Encouraged all group members to make an effort to actively recall one event from their day that was a daily uplift in an effort to protect their mindset from stressors as well as sharing this information with their caregivers to facilitate improved caregiver communication and decrease overall burden of care.  Issued pt handouts on healthy coping strategies to implement into routine. Pt transported back to room by RT.  Therapy Documentation Precautions:  Precautions Precautions: Fall Precaution Comments: Rt hemi Restrictions Weight Bearing Restrictions: No    Pain: no pain reported during session     Therapy/Group: Group Therapy  Barron Schmid 07/03/2021, 12:40 PM

## 2021-07-03 NOTE — Progress Notes (Signed)
PROGRESS NOTE   Subjective/Complaints: Pt up in chair. Was doing some flash card exercises. Slept well. No pain. Has been working on the flash cards a few times this morning  ROS: limited due to language/communication   Objective:   No results found. No results for input(s): WBC, HGB, HCT, PLT in the last 72 hours.  No results for input(s): NA, K, CL, CO2, GLUCOSE, BUN, CREATININE, CALCIUM in the last 72 hours.   Intake/Output Summary (Last 24 hours) at 07/03/2021 1142 Last data filed at 07/03/2021 0738 Gross per 24 hour  Intake 720 ml  Output 700 ml  Net 20 ml        Physical Exam: Vital Signs Blood pressure 105/69, pulse 65, temperature 98.3 F (36.8 C), temperature source Oral, resp. rate 17, height 6' (1.829 m), SpO2 95 %.   Constitutional: No distress . Vital signs reviewed. HEENT: NCAT, EOMI, oral membranes moist Neck: supple Cardiovascular: RRR without murmur. No JVD    Respiratory/Chest: CTA Bilaterally without wheezes or rales. Normal effort    GI/Abdomen: BS +, non-tender, non-distended Ext: no clubbing, cyanosis, or edema Psych: pleasant and cooperative  Skin: No evidence of breakdown, no evidence of rash MSK- he has no pain with shoulder ROM     Mental Status: He is alert.     Comments: non-fluent aphasia but able to put together phrases and short sentences. Comprehension excellent.  Right central 7 and tongue deviation. Right arm and leg 1/2 sensation for LT and PP. RUE tr/5 deltoid but otherwise 0/5.  RLE 1/5 HE, HAD, trace to absent distally. DTR's trace. No hypertonicity. LUE and LLE 5/5 with normal sensation         Assessment/Plan: 1. Functional deficits which require 3+ hours per day of interdisciplinary therapy in a comprehensive inpatient rehab setting. Physiatrist is providing close team supervision and 24 hour management of active medical problems listed below. Physiatrist and rehab team  continue to assess barriers to discharge/monitor patient progress toward functional and medical goals  Care Tool:  Bathing    Body parts bathed by patient: Right arm, Left arm, Chest, Abdomen, Front perineal area, Right upper leg, Left upper leg, Right lower leg, Left lower leg, Face   Body parts bathed by helper: Buttocks     Bathing assist Assist Level: Minimal Assistance - Patient > 75%     Upper Body Dressing/Undressing Upper body dressing   What is the patient wearing?: Pull over shirt    Upper body assist Assist Level: Supervision/Verbal cueing    Lower Body Dressing/Undressing Lower body dressing      What is the patient wearing?: Underwear/pull up, Pants     Lower body assist Assist for lower body dressing: Minimal Assistance - Patient > 75%     Toileting Toileting Toileting Activity did not occur (Clothing management and hygiene only): N/A (no void or bm)  Toileting assist Assist for toileting: Moderate Assistance - Patient 50 - 74% Assistive Device Comment: with urinal in bed   Transfers Chair/bed transfer  Transfers assist     Chair/bed transfer assist level: Minimal Assistance - Patient > 75%     Locomotion Ambulation   Ambulation assist  Ambulation activity did not occur: Safety/medical concerns  Assist level: 2 helpers Assistive device: Other (comment) (rail in hallway) Max distance: 30'   Walk 10 feet activity   Assist  Walk 10 feet activity did not occur: Safety/medical concerns  Assist level: 2 helpers Assistive device: Other (comment) (rail in hallway)   Walk 50 feet activity   Assist Walk 50 feet with 2 turns activity did not occur: Safety/medical concerns  Assist level: Dependent - Patient 0% Assistive device: Lite Gait    Walk 150 feet activity   Assist Walk 150 feet activity did not occur: Safety/medical concerns  Assist level: Dependent - Patient 0% Assistive device: Lite Gait    Walk 10 feet on uneven surface   activity   Assist Walk 10 feet on uneven surfaces activity did not occur: Safety/medical concerns         Wheelchair     Assist Is the patient using a wheelchair?: Yes Type of Wheelchair: Manual    Wheelchair assist level: Supervision/Verbal cueing Max wheelchair distance: 150    Wheelchair 50 feet with 2 turns activity    Assist        Assist Level: Supervision/Verbal cueing   Wheelchair 150 feet activity     Assist      Assist Level: Supervision/Verbal cueing   Blood pressure 105/69, pulse 65, temperature 98.3 F (36.8 C), temperature source Oral, resp. rate 17, height 6' (1.829 m), SpO2 95 %.    Medical Problem List and Plan: 1. 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  Impaired mobility:   Lovenox.              -antiplatelet therapy: N/A 3. Pain Management: Tylenol as needed 4. Mood: Provide emotional support             -antipsychotic agents: N/A 5. Neuropsych: This patient is? capable of making decisions on his own behalf. 6. Skin/Wound Care: Routine skin checks 7. Fluids/Electrolytes/Nutrition: Routine in and outs with follow-up chemistries 8.  Hypertension.  Norvasc 10 mg daily, lisinopril 20 mg twice daily, increase lopressor to 100mg  BID.    Vitals:   07/02/21 1356 07/03/21 0409  BP: 108/76 105/69  Pulse: 70 65  Resp: 18 17  Temp: 98.1 F (36.7 C) 98.3 F (36.8 C)  SpO2: 100% 95%  Well controlled 1/13  9.  Hyperlipidemia.  Hold due to IPH.  -Consider low dose statin at discharge 10. AKI: encouraging po, recent Cr 1.16  -follow up  Monday 11. Suboptimal potassium:    1/9 3.7, supplement with diet 12. Insomnia: melatonin 5mg  and grounds pass ordered to help reset circadian rhythm.  - trazodone effective           LOS: 14 days A FACE TO FACE EVALUATION WAS PERFORMED  3/9 07/03/2021, 11:42 AM

## 2021-07-03 NOTE — Progress Notes (Signed)
Physical Therapy Session Note  Patient Details  Name: Wayne Mendez MRN: [id number] Date of Birth: [birth year]  Today's Date: 07/03/2021 PT Co-Treatment Time: 1300-1330 PT Co-Treatment Time Calculation (min): 30 min  Short Term Goals: Week 2:  PT Short Term Goal 1 (Week 2): Pt will ambulation x 50 ft with mod A and LRAD PT Short Term Goal 2 (Week 2): Pt will maintain static standing balance with min A x 5 min PT Short Term Goal 3 (Week 2): Pt will initiate stair training as safe and able  Skilled Therapeutic Interventions/Progress Updates:    Cotreatment session with PTA with focus on gait training with use of Eva walker. Pt received seated in w/c in room, agreeable to PT session. No complaints of pain. Manual w/c propulsion to/from therapy gym at Supervision level with use of L UE/LE. Squat pivot transfer w/c to mat table with CGA. Attempted to use maxi sky for gait training but device found to be not charged. Use of Eva walker for gait training during session as well as RLE DF assist ACE wrap. Pt is able to stand to [facility] walker with min A. Pt requires skilled assistance x 3 for gait training with Carley Hammed walker x 135 ft, x 102 ft with assistance for R knee blocking into extension during stance, assist to advance RLE with onset of fatigue, assist for lateral weight shift and pelvic rotation, assist to keep R elbow in place on Eva walker, and cues for upright trunk. Pt exhibits improved ability to advance RLE this date! With onset of fatigue pt does exhibit trunk extension compensation in order to advance limb. Pt also exhibits increased ability to activate quads in RLE during stance phase and is occasionally able to bring knee into extension during gait without assist! Pt would benefit from ongoing high-intensity gait training. Pt encouraged by his progress this date. Pt left seated in w/c in room with needs in reach, quick release belt and chair alarm in place at end of session.  Therapy  Documentation Precautions:  Precautions Precautions: Fall Precaution Comments: Rt hemi Restrictions Weight Bearing Restrictions: No     Therapy/Group: Co-Treatment   Peter Congo, PT, DPT, CSRS  07/03/2021, 1:59 PM

## 2021-07-03 NOTE — Progress Notes (Signed)
Physical Therapy Session Note  Patient Details  Name: Aziz Slape MRN: [id number] Date of Birth: [birth year]  Today's Date: 07/03/2021 PT Co-Treatment Time: 1300-1330 PT Co-Treatment Time Calculation (min): 25 mi     Short Term Goals: Week 2:  PT Short Term Goal 1 (Week 2): Pt will ambulation x 50 ft with mod A and LRAD PT Short Term Goal 2 (Week 2): Pt will maintain static standing balance with min A x 5 min PT Short Term Goal 3 (Week 2): Pt will initiate stair training as safe and able  Skilled Therapeutic Interventions/Progress Updates: Pt presented in w/c agreeable to therapy. Session to focus on gait training with skilled clinicians. Pt propelled to rehab gym and participated in gait training with Harmon Pier walker. Pt ambulated first bout at 121f with maxA and +3 for safety. Pt initially required assist for advancement of RLE, reducing L pelvic rotation when advancing RLE, facilitating weight shift to R in RLE stance phase, and blocking of R knee. Pt was able to progress to advancing RLE >50% of time, reducing pelvic rotation, and intermittently able to extend R knee in stance phase. Pt noted with fatigue forgetting to advance RLE. After extended seated rest break pt able to ambulate an additional 102 ft in same manner as prior however with fatigue compensating and increasing trunk extension to compensate for RLE advancement. After gait activity pt propelled back to room and remained in w/c with belt alarm on, call bell within reach and needs met.      Therapy Documentation Precautions:  Precautions Precautions: Fall Precaution Comments: Rt hemi Restrictions Weight Bearing Restrictions: No General:   Vital Signs: Therapy Vitals Temp: 98.8 F (37.1 C) Pulse Rate: 68 Resp: 18 BP: 121/79 Patient Position (if appropriate): Sitting Oxygen Therapy SpO2: 99 % O2 Device: Room Air Pain: Pain Assessment Pain Scale: 0-10 Pain Score: 0-No pain Mobility:   Locomotion :     Trunk/Postural Assessment :    Balance:   Exercises:   Other Treatments:      Therapy/Group: Co-Treatment  Marylin Lathon 07/03/2021, 3:51 PM

## 2021-07-04 NOTE — Progress Notes (Signed)
PROGRESS NOTE   Subjective/Complaints: Working with SLP, had RIght arm movement when he was yawning last noc, had xtra OT session this am   ROS: limited due to language/communication   Objective:   No results found. No results for input(s): WBC, HGB, HCT, PLT in the last 72 hours.  No results for input(s): NA, K, CL, CO2, GLUCOSE, BUN, CREATININE, CALCIUM in the last 72 hours.   Intake/Output Summary (Last 24 hours) at 07/04/2021 1145 Last data filed at 07/03/2021 2300 Gross per 24 hour  Intake 720 ml  Output 200 ml  Net 520 ml         Physical Exam: Vital Signs Blood pressure 124/75, pulse 65, temperature 98.7 F (37.1 C), temperature source Oral, resp. rate 17, height 6' (1.829 m), SpO2 96 %.    General: No acute distress Mood and affect are appropriate Heart: Regular rate and rhythm no rubs murmurs or extra sounds Lungs: Clear to auscultation, breathing unlabored, no rales or wheezes Abdomen: Positive bowel sounds, soft nontender to palpation, nondistended Extremities: No clubbing, cyanosis, or edema Skin: No evidence of breakdown, no evidence of rash     Mental Status: He is alert.     Comments: non-fluent aphasia but able to put together phrases and short sentences. Comprehension excellent.  Right central 7 and tongue deviation. Right arm and leg 1/2 sensation for LT and PP. RUE tr/5 deltoid but otherwise 0/5.  RLE 3- Hip flexion !  No hypertonicity. LUE and LLE 5/5 with normal sensation         Assessment/Plan: 1. Functional deficits which require 3+ hours per day of interdisciplinary therapy in a comprehensive inpatient rehab setting. Physiatrist is providing close team supervision and 24 hour management of active medical problems listed below. Physiatrist and rehab team continue to assess barriers to discharge/monitor patient progress toward functional and medical goals  Care Tool:  Bathing    Body  parts bathed by patient: Right arm, Left arm, Chest, Abdomen, Front perineal area, Right upper leg, Left upper leg, Right lower leg, Left lower leg, Face   Body parts bathed by helper: Buttocks     Bathing assist Assist Level: Minimal Assistance - Patient > 75%     Upper Body Dressing/Undressing Upper body dressing   What is the patient wearing?: Pull over shirt    Upper body assist Assist Level: Supervision/Verbal cueing    Lower Body Dressing/Undressing Lower body dressing      What is the patient wearing?: Underwear/pull up, Pants     Lower body assist Assist for lower body dressing: Minimal Assistance - Patient > 75%     Toileting Toileting Toileting Activity did not occur (Clothing management and hygiene only): N/A (no void or bm)  Toileting assist Assist for toileting: Moderate Assistance - Patient 50 - 74% Assistive Device Comment: with steady   Transfers Chair/bed transfer  Transfers assist     Chair/bed transfer assist level: Minimal Assistance - Patient > 75%     Locomotion Ambulation   Ambulation assist   Ambulation activity did not occur: Safety/medical concerns  Assist level: 2 helpers Assistive device: Other (comment) (rail in hallway) Max distance: 69'  Walk 10 feet activity   Assist  Walk 10 feet activity did not occur: Safety/medical concerns  Assist level: 2 helpers Assistive device: Other (comment) (rail in hallway)   Walk 50 feet activity   Assist Walk 50 feet with 2 turns activity did not occur: Safety/medical concerns  Assist level: Dependent - Patient 0% Assistive device: Lite Gait    Walk 150 feet activity   Assist Walk 150 feet activity did not occur: Safety/medical concerns  Assist level: Dependent - Patient 0% Assistive device: Lite Gait    Walk 10 feet on uneven surface  activity   Assist Walk 10 feet on uneven surfaces activity did not occur: Safety/medical concerns         Wheelchair     Assist  Is the patient using a wheelchair?: Yes Type of Wheelchair: Manual    Wheelchair assist level: Supervision/Verbal cueing Max wheelchair distance: 150    Wheelchair 50 feet with 2 turns activity    Assist        Assist Level: Supervision/Verbal cueing   Wheelchair 150 feet activity     Assist      Assist Level: Supervision/Verbal cueing   Blood pressure 124/75, pulse 65, temperature 98.7 F (37.1 C), temperature source Oral, resp. rate 17, height 6' (1.829 m), SpO2 96 %.    Medical Problem List and Plan: 1. 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  Impaired mobility:   Lovenox.              -antiplatelet therapy: N/A 3. Pain Management: Tylenol as needed 4. Mood: Provide emotional support             -antipsychotic agents: N/A 5. Neuropsych: This patient is? capable of making decisions on his own behalf. 6. Skin/Wound Care: Routine skin checks 7. Fluids/Electrolytes/Nutrition: Routine in and outs with follow-up chemistries 8.  Hypertension.  Norvasc 10 mg daily, lisinopril 20 mg twice daily, increase lopressor to 100mg  BID.    Vitals:   07/03/21 2011 07/04/21 0541  BP: (!) 145/78 124/75  Pulse: 79 65  Resp: 18 17  Temp: 98.4 F (36.9 C) 98.7 F (37.1 C)  SpO2: 99% 96%  Well controlled 1/14  9.  Hyperlipidemia.  Hold due to IPH.  -Consider low dose statin at discharge 10. AKI: encouraging po, recent Cr 1.16  -follow up Monday 11. Suboptimal potassium:    1/9 3.7, supplement with diet 12. Insomnia: melatonin 5mg  and grounds pass ordered to help reset circadian rhythm.  - trazodone effective            LOS: 15 days A FACE TO FACE EVALUATION WAS PERFORMED  3/9 07/04/2021, 11:45 AM

## 2021-07-04 NOTE — Progress Notes (Signed)
Occupational Therapy Session Note  Patient Details  Name: Wayne Mendez MRN: [id number] Date of Birth: Aug 01, 1965  Today's Date: 07/04/2021 OT Individual Time: 0800-0909 OT Individual Time Calculation (min): 69 min    Skilled Therapeutic Interventions/Progress Updates:    Pt greeted in bed with no c/o pain. Agreeable to extra therapy time, very motivated to participate. He did not want to shower. Supine<sit from flat bed without bedrail transitioning to the Rt side completed with supervision assist, 1 LOB back into the bed. Vcs and Min A for squat pivot<w/c going towards affected Rt side. Pt then self propelled to the dayroom, did not bump into any obstacles on the Rt side today! With use of the mirror for visual feedback, worked on standing balance and forward/backward stepping while pt held onto a chair with his Lt hand and Rt arm supported around OT's shoulder. Pt required Mod A to move the Rt LE today which is an improvement compared to session with this therapist last week. Worked extensively on midline orientation and midline awareness as well. During seated rest, worked on Rt LE hamstring stretching and ankle stretching in dorsiflexion. Pt able to feel dorsiflexion stretch and also able to perform himself utilizing seated figure 4 position. Able to distinguish more sensation to noxious and deep touch pressure stimuli in Rt UE compared to session last week. E-stim applied to anterior/posterior deltoids while pt performed seated towel slides shoulder flexion/extension, unable to externally rotate his shoulder without Max facilitation from therapist. Vcs for decreasing compensatory strategies of trunk. While standing at high low table, pt performed Lt>Rt weight shifts and small table "push ups" with Min facilitation for equal UE/LE weightbearing. Pt took minimal rest breaks, reported feeling fatigued but in a good way. He self propelled back to the room, once again not bumping into any obstacles on his  Rt side. Pt remained sitting up in the w/c, safety belt fastened and half lap tray applied to the w/c. Tx focus today was placed on postural control, NMR, and standing balance to improve functional performance during self care tasks and transfers.    Pt tolerated E-stim to anterior/posterior deltoids for ~25 minutes today. Skin intact once pads were removed. E-stim parameters are stated below :  Saebo Stim One 330 pulse width 35 Hz pulse rate On 8 sec/ off 8 sec Ramp up/ down 2 sec Symmetrical Biphasic wave form  Max intensity at 500 Ohm load   Teds were also donned before OOB activity today  Therapy Documentation Precautions:  Precautions Precautions: Fall Precaution Comments: Rt hemi Restrictions Weight Bearing Restrictions: No Vital Signs: Therapy Vitals Temp: 98.5 F (36.9 C) Temp Source: Oral Pulse Rate: 64 Resp: 16 BP: 108/73 Patient Position (if appropriate): Lying Oxygen Therapy SpO2: 94 % O2 Device: Room Air ADL: ADL Eating: Set up Grooming: Moderate assistance Where Assessed-Grooming: Edge of bed Upper Body Bathing: Moderate assistance Where Assessed-Upper Body Bathing: Edge of bed Lower Body Bathing: Maximal assistance Where Assessed-Lower Body Bathing: Edge of bed Upper Body Dressing: Minimal assistance Where Assessed-Upper Body Dressing: Chair Lower Body Dressing: Maximal assistance Where Assessed-Lower Body Dressing: Edge of bed Toileting: Not assessed Toilet Transfer: Dependent (using Stedy, pt 50%) Toilet Transfer Method: Stand pivot Acupuncturist: Grab bars Tub/Shower Transfer: Not assessed ADL Comments: All sit<stands and functional transfers completed using Stedy  Therapy/Group: Individual Therapy  Sujey Gundry A Ben Habermann 07/04/2021, 12:55 PM

## 2021-07-04 NOTE — Progress Notes (Signed)
At [facility], wife anxious, with multiple questions and concerns R/T patient's upcoming discharge. Encouraged her to come in and observe patient during therapy. Alfredo Martinez A

## 2021-07-04 NOTE — Progress Notes (Signed)
Speech Language Pathology Daily Session Note  Patient Details  Name: Wayne Mendez MRN: [id number] Date of Birth: [birth year]  Today's Date: 07/04/2021 SLP Individual Time: 1115-1200 SLP Individual Time Calculation (min): 45 min  Short Term Goals: Week 3: SLP Short Term Goal 1 (Week 3): STG=LTG (ELOS: discharge planned 1/20)  Skilled Therapeutic Interventions:   Patient seen for skilled ST session focused on aphasia goals. Patient was awake, lying in bed as he was a little tired from earlier OT session. He was able to name 4-5 items in a given category with minA cues to redirect when perseverating somewhat. He was able to communicate with SLP at conversational level, talking about various topics (traveling, family, etc). He continues to demonstrate improving fluency, sentence structure and word finding abilities. SLP noticed that patient was able to provide more general responses, maintain topic and more clearly express himself overall. He also noted that he felt that "something changed", pointing to his head. Patient left in bed with all needs within reach. He continues to benefit from skilled SLP intervention to maximize language function prior to discharge.  Pain Pain Assessment Pain Scale: 0-10 Pain Score: 0-No pain  Therapy/Group: Individual Therapy  Angela Nevin, MA, CCC-SLP Speech Therapy

## 2021-07-05 NOTE — Progress Notes (Signed)
Uneventful night. Right PRAFO and right WHO applied by wife. Alfredo Martinez A

## 2021-07-05 NOTE — Progress Notes (Signed)
Occupational Therapy Session Note  Patient Details  Name: Graylin Sperling MRN: [id number] Date of Birth: August 14, 1965  Today's Date: 07/05/2021 OT Individual Time: 2025-4270 OT Individual Time Calculation (min): 15 min   OT Individual Time: 1400-1430 OT Individual Time Calculation (min): 30 min   Short Term Goals: Week 1:  OT Short Term Goal 1 (Week 1): Pt will complete LB dressing at sit<stand level using RW during 2 consecutive ADL sessions OT Short Term Goal 1 - Progress (Week 1): Met OT Short Term Goal 2 (Week 1): Pt will complete 2 grooming tasks while standing at the sink using RW for standing support as needed OT Short Term Goal 2 - Progress (Week 1): Progressing toward goal OT Short Term Goal 3 (Week 1): Pt will complete 1/3 components of toileting with no more than Mod balance assistance OT Short Term Goal 3 - Progress (Week 1): Progressing toward goal Week 2:  OT Short Term Goal 1 (Week 2): Pt will complete 1/3 components of toileting with no more than Mod balance assistance OT Short Term Goal 1 - Progress (Week 2): Met OT Short Term Goal 2 (Week 2): Pt will complete 2 grooming tasks while standing at the sink using RW for standing support as needed OT Short Term Goal 2 - Progress (Week 2): Met OT Short Term Goal 3 (Week 2): Pt will perform toilet tranfser with min A and min verbal cues for sequencing and safety OT Short Term Goal 3 - Progress (Week 2): Met Week 3:  OT Short Term Goal 1 (Week 3): STG=LTG 2/2 ELOS (continue working towards min A/supervsion LTG)  Skilled Therapeutic Interventions/Progress Updates:  Patient met seated in wc in agreement with OT treatment session. 0/10 pain reported at rest and with activity. Patient/family education on self-ROM for RUE. Estim applied as detailed below. Noted good activation of wrist/digit extensors. Upon return after lunch, session with focus on static standing balance and functional mobility. Patient ambulated from EOB to door of  hospital room with Max multimodal cues for RLE management, external assist for weight shifting, hand over hand assist to maintain position of RUE and cues to decrease level of frustration. Patient very excited with his ability to complete mobility this date. Session concluded with patient seated in wc with call bell within reach, belt alarm activated and all needs met.   60 min saebo E-stim to wrist extensors to restore function in prep for ADLs. Saebo Stim One 330 pulse width 35 Hz pulse rate On 8 sec/ off 8 sec Ramp up/ down 2 sec Symmetrical Biphasic wave form  Max intensity 159m at 500 Ohm load  Therapy Documentation Precautions:  Precautions Precautions: Fall Precaution Comments: Rt hemi Restrictions Weight Bearing Restrictions: No General:    Therapy/Group: Individual Therapy  Emmalee Solivan R Howerton-Davis 07/05/2021, 11:49 AM

## 2021-07-06 LAB — CBC WITH DIFFERENTIAL/PLATELET
Abs Immature Granulocytes: 0.02 10*3/uL (ref 0.00–0.07)
Basophils Absolute: 0 10*3/uL (ref 0.0–0.1)
Basophils Relative: 0 %
Eosinophils Absolute: 0 10*3/uL (ref 0.0–0.5)
Eosinophils Relative: 1 %
HCT: 41.3 % (ref 39.0–52.0)
Hemoglobin: 13.7 g/dL (ref 13.0–17.0)
Immature Granulocytes: 0 %
Lymphocytes Relative: 16 %
Lymphs Abs: 1.2 10*3/uL (ref 0.7–4.0)
MCH: 29.8 pg (ref 26.0–34.0)
MCHC: 33.2 g/dL (ref 30.0–36.0)
MCV: 90 fL (ref 80.0–100.0)
Monocytes Absolute: 0.7 10*3/uL (ref 0.1–1.0)
Monocytes Relative: 9 %
Neutro Abs: 5.9 10*3/uL (ref 1.7–7.7)
Neutrophils Relative %: 74 %
Platelets: 190 10*3/uL (ref 150–400)
RBC: 4.59 MIL/uL (ref 4.22–5.81)
RDW: 13.8 % (ref 11.5–15.5)
WBC: 7.9 10*3/uL (ref 4.0–10.5)
nRBC: 0 % (ref 0.0–0.2)

## 2021-07-06 LAB — COMPREHENSIVE METABOLIC PANEL
ALT: 31 U/L (ref 0–44)
AST: 18 U/L (ref 15–41)
Albumin: 3.6 g/dL (ref 3.5–5.0)
Alkaline Phosphatase: 56 U/L (ref 38–126)
Anion gap: 3 — ABNORMAL LOW (ref 5–15)
BUN: 21 mg/dL — ABNORMAL HIGH (ref 6–20)
CO2: 28 mmol/L (ref 22–32)
Calcium: 8.6 mg/dL — ABNORMAL LOW (ref 8.9–10.3)
Chloride: 106 mmol/L (ref 98–111)
Creatinine, Ser: 1.29 mg/dL — ABNORMAL HIGH (ref 0.61–1.24)
GFR, Estimated: >60 mL/min (ref >60)
Glucose, Bld: 100 mg/dL — ABNORMAL HIGH (ref 70–99)
Potassium: 4.1 mmol/L (ref 3.5–5.1)
Sodium: 137 mmol/L (ref 135–145)
Total Bilirubin: 1.2 mg/dL (ref 0.3–1.2)
Total Protein: 6.7 g/dL (ref 6.5–8.1)

## 2021-07-06 NOTE — Plan of Care (Signed)
°  Problem: RH Stairs Goal: LTG Patient will ambulate up and down stairs w/assist (PT) Description: LTG: Patient will ambulate up and down # of stairs with assistance (PT) Flowsheets (Taken 07/06/2021 1742) LTG: Pt will ambulate up/down stairs assist needed:: (d/c goal due to decreased safety, pt has ramp in place) -- Note: D/c goal due to decreased safety, pt has ramp in place

## 2021-07-06 NOTE — Progress Notes (Signed)
PROGRESS NOTE   Subjective/Complaints:  Pt reports friend went home last week, but coming back today. Has family training today with wife and son.  LBM Saturday but feels like needs to go.  Getting more movement in RLE.  Denies pain.   ROS: limited due to aphasia  Objective:   No results found. Recent Labs    07/06/21 0515  WBC 7.9  HGB 13.7  HCT 41.3  PLT 190    Recent Labs    07/06/21 0515  NA 137  K 4.1  CL 106  CO2 28  GLUCOSE 100*  BUN 21*  CREATININE 1.29*  CALCIUM 8.6*     Intake/Output Summary (Last 24 hours) at 07/06/2021 0834 Last data filed at 07/06/2021 0700 Gross per 24 hour  Intake 1080 ml  Output 1100 ml  Net -20 ml        Physical Exam: Vital Signs Blood pressure 109/71, pulse 66, temperature 97.8 F (36.6 C), temperature source Oral, resp. rate 18, height 6' (1.829 m), SpO2 96 %.     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     Comments: non-fluent aphasia but able to put together phrases and short sentences. Comprehension excellent.  Right central 7 and tongue deviation. Right arm and leg 1/2 sensation for LT and PP. RUE tr/5 deltoid but otherwise 0/5.  RLE 3- Hip flexion- doing better;  !  No hypertonicity. LUE and LLE 5/5 with normal sensation         Assessment/Plan: 1. Functional deficits which require 3+ hours per day of interdisciplinary therapy in a comprehensive inpatient rehab setting. Physiatrist is providing close team supervision and 24 hour management of active medical problems listed below. Physiatrist and rehab team continue to  assess barriers to discharge/monitor patient progress toward functional and medical goals  Care Tool:  Bathing    Body parts bathed by patient: Right arm, Left arm, Chest, Abdomen, Front perineal area, Right upper leg, Left upper leg, Right lower leg, Left lower leg, Face   Body parts bathed by helper: Buttocks     Bathing assist Assist Level: Minimal Assistance - Patient > 75%     Upper Body Dressing/Undressing Upper body dressing   What is the patient wearing?: Pull over shirt    Upper body assist Assist Level: Supervision/Verbal cueing    Lower Body Dressing/Undressing Lower body dressing      What is the patient wearing?: Underwear/pull up, Pants     Lower body assist Assist for lower body dressing: Minimal Assistance - Patient > 75%     Toileting Toileting Toileting Activity did not occur (Clothing management and hygiene only): N/A (no void or bm)  Toileting assist Assist for toileting: Moderate Assistance - Patient 50 - 74% Assistive Device Comment: with steady   Transfers Chair/bed transfer  Transfers assist  Chair/bed transfer assist level: Minimal Assistance - Patient > 75%     Locomotion Ambulation   Ambulation assist   Ambulation activity did not occur: Safety/medical concerns  Assist level: 2 helpers Assistive device: Other (comment) (rail in hallway) Max distance: 30'   Walk 10 feet activity   Assist  Walk 10 feet activity did not occur: Safety/medical concerns  Assist level: 2 helpers Assistive device: Other (comment) (rail in hallway)   Walk 50 feet activity   Assist Walk 50 feet with 2 turns activity did not occur: Safety/medical concerns  Assist level: Dependent - Patient 0% Assistive device: Lite Gait    Walk 150 feet activity   Assist Walk 150 feet activity did not occur: Safety/medical concerns  Assist level: Dependent - Patient 0% Assistive device: Lite Gait    Walk 10 feet on uneven surface   activity   Assist Walk 10 feet on uneven surfaces activity did not occur: Safety/medical concerns         Wheelchair     Assist Is the patient using a wheelchair?: Yes Type of Wheelchair: Manual    Wheelchair assist level: Supervision/Verbal cueing Max wheelchair distance: 150    Wheelchair 50 feet with 2 turns activity    Assist        Assist Level: Supervision/Verbal cueing   Wheelchair 150 feet activity     Assist      Assist Level: Supervision/Verbal cueing   Blood pressure 109/71, pulse 66, temperature 97.8 F (36.6 C), temperature source Oral, resp. rate 18, height 6' (1.829 m), SpO2 96 %.    Medical Problem List and Plan: 1. 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  Impaired mobility:   Lovenox.              -antiplatelet therapy: N/A 3. Pain Management: Tylenol as needed 4. Mood: Provide emotional support             -antipsychotic agents: N/A 5. Neuropsych: This patient is? capable of making decisions on his own behalf. 6. Skin/Wound Care: Routine skin checks 7. Fluids/Electrolytes/Nutrition: Routine in and outs with follow-up chemistries 8.  Hypertension.  Norvasc 10 mg daily, lisinopril 20 mg twice daily, increase lopressor to 100mg  BID.    Vitals:   07/06/21 0507 07/06/21 0745  BP: 108/71 109/71  Pulse: 66 66  Resp: 18   Temp: 97.8 F (36.6 C)   SpO2: 95% 96%   1/16- BP controlled- con't regimen  9.  Hyperlipidemia.  Hold due to IPH.  -Consider low dose statin at discharge 10. AKI: encouraging po, recent Cr 1.16  -follow up Monday 11. Suboptimal potassium:    1/9 3.7, supplement with  diet 12. Insomnia: melatonin 5mg  and grounds pass ordered to help reset circadian rhythm.  - trazodone effective 13. AKI 1/16- likely due to decreased intake- will recheck in AM and push fluids- might need IVFs if doesn't drink more.           LOS: 17 days A FACE  TO FACE EVALUATION WAS PERFORMED  Wayne Mendez 07/06/2021, 8:34 AM

## 2021-07-06 NOTE — Progress Notes (Signed)
Physical Therapy Session Note  Patient Details  Name: Wayne Mendez MRN: [id number] Date of Birth: [birth year]  Today's Date: 07/06/2021 PT Individual Time: 1300-1400 PT Individual Time Calculation (min): 60 min   Short Term Goals: Week 2:  PT Short Term Goal 1 (Week 2): Pt will ambulation x 50 ft with mod A and LRAD PT Short Term Goal 2 (Week 2): Pt will maintain static standing balance with min A x 5 min PT Short Term Goal 3 (Week 2): Pt will initiate stair training as safe and able  Skilled Therapeutic Interventions/Progress Updates:    Pt received seated in w/c in room with wife and son present for hands-on family education session. Discussed follow up therapy and that OPPT being recommended. Pt is at Supervision level for w/c mobility with use of L UE/LE x 200 ft with cues to attend to R visual field. Demonstrated how to assist pt with safe car transfer via squat pivot with min A, pt's wife able to perform return demonstration. Squat pivot transfer w/c to/from mat table with CGA to min A with pt's family able to perform return demonstration. Sit to/from supine with min A for some RLE management. In supine reviewed PROM for R UE and LE to prevent contracture. During session pt exhibits good PROM with no tone. Pt does have onset of tone with mobility and gait training in RUE. Ambulation x 50 ft with RW with R hand orthosis, mod A for balance and blocking R knee, w/c follow for safety with use of DF assist ACE wrap. Pt and his family pleased by progress with gait, understanding that pt will be at w/c level upon d/c home and will continue to work on gait with outpatient therapies. Pt left seated in w/c in room with needs in reach, family present.  Therapy Documentation Precautions:  Precautions Precautions: Fall Precaution Comments: Rt hemi Restrictions Weight Bearing Restrictions: No      Therapy/Group: Individual Therapy   Peter Congo, PT, DPT, CSRS  07/06/2021, 5:36 PM

## 2021-07-06 NOTE — Progress Notes (Signed)
Occupational Therapy Session Note  Patient Details  Name: Wayne Mendez MRN: [id number] Date of Birth: 07/18/1965  Today's Date: 07/06/2021 OT Individual Time: 1400-1445 OT Individual Time Calculation (min): 45 min    Short Term Goals: Week 3:  OT Short Term Goal 1 (Week 3): STG=LTG 2/2 ELOS (continue working towards min A/supervsion LTG)  Skilled Therapeutic Interventions/Progress Updates:    Pt sitting up in w/c, agreeable to OT session no c/o pain. Pts son and wife present for session and very attentive and supportive.  Assessed RUE strength noting the following MMT:    Scapular Elevation and Retraction 1/5  Shoulder Abduction 1/5                 Horizontal adduction 2-/5                 IR 2/5                 ER 1/5  Elbow     Flexion 2-/5                Extension 2-/5  Forearm Pronation 2-/5                Supination 0/5  Wrist       Extension 0/5                Flexion 0/5  Digits 0/5   Also noted positive sulcus sign with full finger tip width space at glenohumeral joint right shoulder.  Pt completed 10 reps of shoulder horizontal adduction, elbow flexion then extension, forearm pronation facilitating external focus using physical targets and multimodal cues.  Pt appearing significantly internally focused and holding breath frequently which likely impairs his motor planning and output.  Educated pt on importance of focusing externally for improved control.  Also educated pt and family regarding shoulder precautions including avoiding PROM above 90 degrees abduction/flexion due to shoulder weakness and instability.  Also educated pt and family on safe positioning when sleeping on affected side. Call bell in reach,family present at end of session.  Therapy Documentation Precautions:  Precautions Precautions: Fall Precaution Comments: Rt hemi Restrictions Weight Bearing Restrictions: No    Therapy/Group: Individual Therapy  Amie Critchley 07/06/2021, 3:12 PM

## 2021-07-06 NOTE — Progress Notes (Signed)
Speech Language Pathology Daily Session Note  Patient Details  Name: Wayne Mendez MRN: [id number] Date of Birth: 1966/03/02  Today's Date: 07/06/2021 SLP Individual Time: 1102-1205 SLP Individual Time Calculation (min): 63 min  Short Term Goals: Week 3: SLP Short Term Goal 1 (Week 3): STG=LTG (ELOS: discharge planned 1/20)  Skilled Therapeutic Interventions:Skilled ST services focused on education and language skills.  [name] and son were present for and actively participated in education. SLP facilitated naming of actions at sentence level given photographs, pt demonstrated 5/5 accuracy mod I. Pt was able to respond to open ended questions with a 1 word response with 100% accuracy and open ended questions with multiple answers (divergent naming) with mod A decreasing to min A semantic and sentence completion cues. Pt was able to read simple paragraphs in RCAB with 70% accuracy and given supervision A verbal cues for error awareness/ re-read information increased to 100% accuracy. Pt demonstrated carryover of reading comprehension strategies, inferencing and slowing rate for reading acuity in inference and factual paragraphs/short stories in RCBA subsection (increase complexity) with mod A verbal cues fading to mod I on last story. SLP provided education throughout the session pertaining to level complexities of cues, strategies for word finding and reading acuity/comprehension. [name] returned demonstration of word finding cues. All questions answered to satisfaction. Pt continues to demonstrate increase in skill level (complexity) and carryover of strategies. Pt was left in room with family, call bell within reach and chair alarm set. SLP recommends to continue skilled services.     Pain Pain Assessment Pain Score: 0-No pain  Therapy/Group: Individual Therapy  Wayne Mendez  [facility] 07/06/2021, 12:36 PM

## 2021-07-06 NOTE — Progress Notes (Signed)
Physical Therapy Weekly Progress Note  Patient Details  Name: Wayne Mendez MRN: [id number] Date of Birth: 11/06/1965  Beginning of progress report period: June 28, 2021 End of progress report period: July 06, 2021  Patient has met 2 of 3 short term goals.  Pt continues to make great progress towards therapy goals. He is currently at Supervision to min A level for bed mobility, CGA to min A for sit to stand and squat pivot transfers, Supervision for w/c mobility with use of L UE/LE, and still requires assist x 2 for safe gait. Pt has been able to initiate gait training with use of RW vs railing in hallway or dependently with use of LiteGait. He does still require a close w/c follow for safety but overall exhibits improved gait mechanics, balance, and safety awareness. Pt's family (wife and son) have completed family education with week in order to safely assist pt upon d/c home. Family has a ramp in place and pt will not need to navigate stairs at home.  Patient continues to demonstrate the following deficits muscle weakness and muscle joint tightness, decreased cardiorespiratoy endurance, abnormal tone and unbalanced muscle activation, decreased attention, decreased awareness, decreased problem solving, and decreased safety awareness, and decreased standing balance, decreased postural control, hemiplegia, and decreased balance strategies and therefore will continue to benefit from skilled PT intervention to increase functional independence with mobility.  Patient progressing toward long term goals..  Plan of care revisions: d/c stair goal due to decreased safety with stair training and family has a ramp in place.  PT Short Term Goals Week 2:  PT Short Term Goal 1 (Week 2): Pt will ambulation x 50 ft with mod A and LRAD PT Short Term Goal 1 - Progress (Week 2): Progressing toward goal PT Short Term Goal 2 (Week 2): Pt will maintain static standing balance with min A x 5 min PT Short Term Goal 2  - Progress (Week 2): Met PT Short Term Goal 3 (Week 2): Pt will initiate stair training as safe and able PT Short Term Goal 3 - Progress (Week 2): Met Week 3:  PT Short Term Goal 1 (Week 3): =LTG due to ELOS  Therapy Documentation Precautions:  Precautions Precautions: Fall Precaution Comments: Rt hemi Restrictions Weight Bearing Restrictions: No   Therapy/Group: Individual Therapy   Excell Seltzer, PT, DPT, CSRS 07/06/2021, 5:36 PM

## 2021-07-06 NOTE — Progress Notes (Signed)
Occupational Therapy Session Note  Patient Details  Name: Wayne Mendez MRN: [id number] Date of Birth: Sep 02, 1965  Today's Date: 07/06/2021 OT Individual Time: 1000-1057 OT Individual Time Calculation (min): 57 min    Short Term Goals: Week 2:  OT Short Term Goal 1 (Week 2): Pt will complete 1/3 components of toileting with no more than Mod balance assistance OT Short Term Goal 1 - Progress (Week 2): Met OT Short Term Goal 2 (Week 2): Pt will complete 2 grooming tasks while standing at the sink using RW for standing support as needed OT Short Term Goal 2 - Progress (Week 2): Met OT Short Term Goal 3 (Week 2): Pt will perform toilet tranfser with min A and min verbal cues for sequencing and safety OT Short Term Goal 3 - Progress (Week 2): Met Week 3:  OT Short Term Goal 1 (Week 3): STG=LTG 2/2 ELOS (continue working towards min A/supervsion LTG)   Skilled Therapeutic Interventions/Progress Updates:    Pt greeted at time of session sitting up in wheelchair with wife and son in the room for family ed and training. Initial part of session focused on squat pivot transfers wheelchair <> bed first trial with therapist Min guard both directions, performed x1 with both son and spouse. Transitioned to bathroom and performed squat pivot wheelchair <> BSC over toilet in simulated environment for home, performed again with CGA/Min. Wife also performing x1. Sit <> stands at commode with R knee block Min guard and simulated toileting tasks with both therapist for demonstration and spouse. Shower bench transfer in same manner. Note extensive discussion with spouse and patient regarding home set up, bathroom "room" that will be separate and house toilet, DME needs, etc. Pt up in wheelchair alarm on call bell in reach and RUE elevated.   Therapy Documentation Precautions:  Precautions Precautions: Fall Precaution Comments: Rt hemi Restrictions Weight Bearing Restrictions: No    Therapy/Group: Individual  Therapy  Viona Gilmore 07/06/2021, 7:25 AM

## 2021-07-07 LAB — BASIC METABOLIC PANEL
Anion gap: 10 (ref 5–15)
BUN: 19 mg/dL (ref 6–20)
CO2: 25 mmol/L (ref 22–32)
Calcium: 9 mg/dL (ref 8.9–10.3)
Chloride: 105 mmol/L (ref 98–111)
Creatinine, Ser: 1.17 mg/dL (ref 0.61–1.24)
GFR, Estimated: >60 mL/min (ref >60)
Glucose, Bld: 112 mg/dL — ABNORMAL HIGH (ref 70–99)
Potassium: 3.7 mmol/L (ref 3.5–5.1)
Sodium: 140 mmol/L (ref 135–145)

## 2021-07-07 NOTE — Progress Notes (Signed)
PROGRESS NOTE   Subjective/Complaints:  Pt reports friend is back- til her goes home and a little bit longer.  Per OT,  getting more tone in R bicep and shoulder.  No response to estim.   D/c planned for Friday.  We discussed baclofen or not- due to tone- will wait and see how ROM works.   ROS: Limited due to aphasia  Objective:   No results found. Recent Labs    07/06/21 0515  WBC 7.9  HGB 13.7  HCT 41.3  PLT 190    Recent Labs    07/06/21 0515 07/07/21 0510  NA 137 140  K 4.1 3.7  CL 106 105  CO2 28 25  GLUCOSE 100* 112*  BUN 21* 19  CREATININE 1.29* 1.17  CALCIUM 8.6* 9.0     Intake/Output Summary (Last 24 hours) at 07/07/2021 0830 Last data filed at 07/07/2021 6283 Gross per 24 hour  Intake 240 ml  Output 300 ml  Net -60 ml        Physical Exam: Vital Signs Blood pressure 107/85, pulse 95, temperature 98.9 F (37.2 C), temperature source Oral, resp. rate 16, height 6' (1.829 m), SpO2 95 %.      General: awake, alert, appropriate, NAD HENT: conjugate gaze; oropharynx moist CV: regular rate; no JVD Pulmonary: CTA B/L; no W/R/R- good air movement GI: soft, NT, ND, (+)BS Psychiatric: appropriate Neurological: nonfluent aphasia- about the same today Developing tone/spasticity in RUE- MAS of 1+ in R bicep/triceps; trac ein R wrist and MAS of 2 in R shoulder, esp with external rotation.     Mental Status: He is alert.     Comments: non-fluent aphasia but able to put together phrases and short sentences. Comprehension excellent.  Right central 7 and tongue deviation. Right arm and leg 1/2 sensation for LT and PP. RUE tr/5 deltoid but otherwise 0/5.  RLE 3- Hip flexion- doing better;  !  No hypertonicity. LUE and LLE 5/5 with normal sensation         Assessment/Plan: 1. Functional deficits which require 3+ hours per day of interdisciplinary therapy in a comprehensive inpatient rehab  setting. Physiatrist is providing close team supervision and 24 hour management of active medical problems listed below. Physiatrist and rehab team continue to assess barriers to discharge/monitor patient progress toward functional and medical goals  Care Tool:  Bathing    Body parts bathed by patient: Right arm, Left arm, Chest, Abdomen, Front perineal area, Right upper leg, Left upper leg, Right lower leg, Left lower leg, Face   Body parts bathed by helper: Buttocks     Bathing assist Assist Level: Minimal Assistance - Patient > 75%     Upper Body Dressing/Undressing Upper body dressing   What is the patient wearing?: Pull over shirt    Upper body assist Assist Level: Supervision/Verbal cueing    Lower Body Dressing/Undressing Lower body dressing      What is the patient wearing?: Underwear/pull up, Pants     Lower body assist Assist for lower body dressing: Minimal Assistance - Patient > 75%     Toileting Toileting Toileting Activity did not occur (Clothing management and hygiene only): N/A (  no void or bm)  Toileting assist Assist for toileting: Moderate Assistance - Patient 50 - 74% Assistive Device Comment: with steady   Transfers Chair/bed transfer  Transfers assist     Chair/bed transfer assist level: Minimal Assistance - Patient > 75%     Locomotion Ambulation   Ambulation assist   Ambulation activity did not occur: Safety/medical concerns  Assist level: 2 helpers Assistive device: Walker-rolling Max distance: 50'   Walk 10 feet activity   Assist  Walk 10 feet activity did not occur: Safety/medical concerns  Assist level: 2 helpers Assistive device: Walker-rolling   Walk 50 feet activity   Assist Walk 50 feet with 2 turns activity did not occur: Safety/medical concerns  Assist level: Moderate Assistance - Patient - 50 - 74% Assistive device: Walker-rolling    Walk 150 feet activity   Assist Walk 150 feet activity did not occur:  Safety/medical concerns  Assist level: Dependent - Patient 0% Assistive device: Lite Gait    Walk 10 feet on uneven surface  activity   Assist Walk 10 feet on uneven surfaces activity did not occur: Safety/medical concerns         Wheelchair     Assist Is the patient using a wheelchair?: Yes Type of Wheelchair: Manual    Wheelchair assist level: Supervision/Verbal cueing Max wheelchair distance: 150    Wheelchair 50 feet with 2 turns activity    Assist        Assist Level: Supervision/Verbal cueing   Wheelchair 150 feet activity     Assist      Assist Level: Supervision/Verbal cueing   Blood pressure 107/85, pulse 95, temperature 98.9 F (37.2 C), temperature source Oral, resp. rate 16, height 6' (1.829 m), SpO2 95 %.    Medical Problem List and Plan: 1. 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  Impaired mobility:   Lovenox.              -antiplatelet therapy: N/A 3. Pain Management: Tylenol as needed 4. Mood: Provide emotional support             -antipsychotic agents: N/A 5. Neuropsych: This patient is? capable of making decisions on his own behalf. 6. Skin/Wound Care: Routine skin checks 7. Fluids/Electrolytes/Nutrition: Routine in and outs with follow-up chemistries 8.  Hypertension.  Norvasc 10 mg daily, lisinopril 20 mg twice daily, increase lopressor to 100mg  BID.    Vitals:   07/06/21 2106 07/07/21 0329  BP: 133/84 107/85  Pulse: 90 95  Resp: 16 16  Temp: 98.3 F (36.8 C) 98.9 F (37.2 C)  SpO2: 97% 95%   1/17- BP controlled- con't  regimen 9.  Hyperlipidemia.  Hold due to IPH.  -Consider low dose statin at discharge 10. AKI: encouraging po, recent Cr 1.16  -follow up Monday 11. Suboptimal potassium:    1/9 3.7, supplement with diet 12. Insomnia: melatonin 5mg  and grounds pass ordered to help reset circadian rhythm.  -  trazodone effective 13. AKI 1/16- likely due to decreased intake- will recheck in AM and push fluids- might need IVFs if doesn't drink more. 1/17- resolved today after pushing fluids 14. Spasticity 1/17- Developing tone of RUE/RLE- went over spasticity again and educated pt/friend on it, it's process- takes 1-2 years ot hit maximal progress and usually get worse 3-12 weeks- so I'm concerned going home without meds might put him at risk for contractures- will wait on baclofen for today and start ROM 10 reps 3-4x/day and reassess in AM.   I spent a total of 39 minutes on total care- >50% coordination of care - team conference- educating pt, BFF on spasticity and options to treat.          LOS: 18 days A FACE TO FACE EVALUATION WAS PERFORMED  Idabelle Mcpeters 07/07/2021, 8:30 AM

## 2021-07-07 NOTE — Progress Notes (Signed)
Patient ID: Wayne Mendez, male   DOB: 05/06/1966, 56 y.o.   MRN: [id number] ° °SW met with pt in room to provide updates on gains made in rehab, and d/c date remains on Friday (1/20). Pt reports that his wife will be here later on today. ° °*second attempt wife is not here. SW provided pt wife Trish with updates from team conference. Confirms she spoke with [employer] about DME delivery. Prefers [facility] (p:[phone]/f:[phone]). She would also like to know about family meeting on Thursday. SW informed will confirm with physician and f/u. ° ° °Auria Chamberlain, MSW, LCSWA °Office: [phone] °Cell: [phone] °Fax: [phone]  °

## 2021-07-07 NOTE — Patient Care Conference (Cosign Needed Addendum)
Inpatient RehabilitationTeam Conference and Plan of Care Update Date: 07/07/2021   Time: 10:59 AM    Patient Name: Wayne Mendez      Medical Record Number: [id number]  Date of Birth: Nov 09, 1965 Sex: Male         Room/Bed: 4W07C/4W07C-01 Payor Info: Payor: Advertising copywriter / Plan: Advertising copywriter OTHER / Product Type: *No Product type* /    Admit Date/Time:  06/19/2021  4:37 PM  Primary Diagnosis:  ICH (intracerebral hemorrhage) [facility])  Hospital Problems: Principal Problem:   ICH (intracerebral hemorrhage) (HCC) Active Problems:   Expressive aphasia    Expected Discharge Date: Expected Discharge Date: 07/10/21  Team Members Present: Physician leading conference: Dr. Genice Rouge Nurse Present: Chana Bode, RN PT Present: Peter Congo, PT OT Present: Ardis Rowan, COTA;Jennifer Katrinka Blazing, OT SLP Present: Colin Benton, SLP PPS Coordinator present : Fae Pippin, SLP     Current Status/Progress Goal Weekly Team Focus  Bowel/Bladder   Pt is continent of b/b. w/ normal bowel pattern.  Remain continent  Q2h toileting/ PRN   Swallow/Nutrition/ Hydration             ADL's   bathing-min A at shower level; UB dressing-supervisoin; LB dressing-min A: tranfsers-CGA; impulsivity improving  supervision/min A overall  education, RUE NMR, tranfsers, safety   Mobility   Supervision to min A bed mobility, CGA to min A squat pivot transfer, min A to stand to RW, Supervision w/c mobility, mod A gait with RW with close w/c follow  Supervision transfers, downgraded gait to mod A, d/c stair goal due to safety and family has ramp in place  family edu, d/c planning, R NMR, gait   Communication   min-supervision A, greater carryover of self-cuing  mod I basic multimodal, supervision to minA more complex  education, word finding, reading at paragraph level, writting   Safety/Cognition/ Behavioral Observations  supervision A  mod I awareness, attention  education, error awareness   Pain   no  complaints of pain at this time.  will be free of pain.  Assess [place] qshift/ PRN   Skin   Skin in tact, no signs of breakdown  no new skin breakdown  Assess skin qshift/prn     Discharge Planning:  D/c to home with his wife and support from his friend that will be coming back from [place]. [place] to help provide assistance at time of discharge. Fam edu completed on 1/16 with pt wife and son.   Team Discussion: Patient with clonus in shoulder; wincing noted with external rotation. Medically better per MD; BP stable, potassium  level better and AKI addressed; pushed fluids and labs improved.  Patient on target to meet rehab goals: yes, currently need CGA for transfers and Mod assist to ambulate with a RW and cues for sequencing. Unfortunately will not be safe with walking at home. Needs CGA for ADLs and problem solving the bathroom management with set up. Aphasia has improved and working on complex reading.   *See Care Plan and progress notes for long and short-term goals.   Revisions to Treatment Plan:  N/A   Teaching Needs: Family education completed 07/06/21 with wife and son   Current Barriers to Discharge: Decreased caregiver support; ramped entry to home in place  Possible Resolutions to Barriers: Family education with wife/son DME: Wheelchair, RW, right lap tray OP follow up services (PT, and SLP)     Medical Summary Current Status: AKI has resolved with pushing fluids- continent B/B; somewhat constipated  Barriers to  Discharge: Home enviroment access/layout;Medical stability;Weight bearing restrictions;Other (comments)  Barriers to Discharge Comments: family education yesterday Possible Resolutions to Barriers/Weekly Focus: CGA for transfers- mod A RW with therapy only; has ramp; on target for Friday- slightly impulsive- improving; Developing spasticity on RUE>RLE- will try ROM, might need Baclofen- d/c 07/10/21   Continued Need for Acute Rehabilitation Level of Care: The patient  requires daily medical management by a physician with specialized training in physical medicine and rehabilitation for the following reasons: Direction of a multidisciplinary physical rehabilitation program to maximize functional independence : Yes Medical management of patient stability for increased activity during participation in an intensive rehabilitation regime.: Yes Analysis of laboratory values and/or radiology reports with any subsequent need for medication adjustment and/or medical intervention. : Yes   I attest that I was present, lead the team conference, and concur with the assessment and plan of the team.   Chana Bode B 07/07/2021, 2:31 PM

## 2021-07-07 NOTE — Progress Notes (Signed)
Occupational Therapy Session Note  Patient Details  Name: Reshard Guillet MRN: [id number] Date of Birth: 21-Mar-1966  Today's Date: 07/07/2021 OT Individual Time: 8657-8469 OT Individual Time Calculation (min): 73 min    Short Term Goals: Week 3:  OT Short Term Goal 1 (Week 3): STG=LTG 2/2 ELOS (continue working towards min A/supervsion LTG)  Skilled Therapeutic Interventions/Progress Updates:    Pt resting in bed upon arrival. OT intervention with focus on bed mobility, squat pivot transfers, standing balance, bathing at shower level, dressig with sit<>stand from w/c, discharge planning, and safety awareness to increase independence with bADLS. All squat pivot tranfsers with min A/CGA. Standing balance with CGA this morning. Pt requires assistance with washing buttocks and donning Ted Hose. Pt with improved impulsivity. Pt remained in w/c with all needs within reach and belt alarm activated. Half lap tray in place.  Therapy Documentation Precautions:  Precautions Precautions: Fall Precaution Comments: Rt hemi Restrictions Weight Bearing Restrictions: No   Pain:  Pt denies pain this morning  Therapy/Group: Individual Therapy  Rich Brave 07/07/2021, 8:16 AM

## 2021-07-07 NOTE — Progress Notes (Signed)
Physical Therapy Session Note  Patient Details  Name: Wayne Mendez MRN: [id number] Date of Birth: August 15, 1965  Today's Date: 07/08/2021 PT Individual Time: 1300-1400 PT Individual Time Calculation (min): 60 min   Short Term Goals: Week 3:  PT Short Term Goal 1 (Week 3): =LTG due to ELOS  Skilled Therapeutic Interventions/Progress Updates:    Pt received seated in w/c in room, agreeable to PT session. No complaints of pain. Provided handout for HEP, see below for details. Pt then taken outdoors for trial car transfer on vehicle pt will d/c home in. Pt able to perform w/c mobility at Supervision level for over 500 ft with use of L UE/LE with min cueing for obstacle avoidance. Pt able to perform car transfer at [facility] level and pt's wife able to perform return demonstration of transfer with patient. Pt and family demonstrate good understanding of pt's current assist level and good ability to perform transfers safely at home. Sit to stand with min A to RW. Ambulation x 100 ft with RW and mod A for balance, assist needed to block R knee in stance and for lateral weight shift onto RLE. Use of R hand orthosis on RW and DF assist wrap for RLE, close w/c follow for safety. Second round of ambulation x 30 ft with use of mirror for visual feedback and increased focus on pt performing lateral and anterior weight shift onto RLE, mod to max A needed with use of RW and close w/c follow. Pt returns to bed at end of session, squat pivot transfer w/c to bed with CGA. Sit to supine Supervision. Pt left seated in bed with needs in reach, family present.  Access Code: [email or web address] URL: [email or web address] Date: 07/07/2021 Prepared by: Peter Congo  Exercises Supine Active Straight Leg Raise - 1 x daily - 7 x weekly - 3 sets - 10 reps Supine Quad Set - 1 x daily - 7 x weekly - 3 sets - 10 reps Supine Gluteal Sets - 1 x daily - 7 x weekly - 3 sets - 10 reps Supine Bridge - 1 x daily - 7 x weekly - 3 sets -  10 reps Supine Hamstring Stretch with Strap - 1 x daily - 7 x weekly - 1 sets - 5 reps - 60 hold Seated Hamstring Stretch - 1 x daily - 7 x weekly - 1 sets - 5 reps - 60 hold   Therapy Documentation Precautions:  Precautions Precautions: Fall Precaution Comments: Rt hemi Restrictions Weight Bearing Restrictions: No     Therapy/Group: Individual Therapy   Peter Congo, PT, DPT, CSRS  07/08/2021, 4:56 PM

## 2021-07-07 NOTE — Progress Notes (Signed)
Speech Language Pathology Daily Session Note  Patient Details  Name: Wayne Mendez MRN: [id number] Date of Birth: 05/30/1966  Today's Date: 07/07/2021 SLP Individual Time: 1432-1530 SLP Individual Time Calculation (min): 58 min  Short Term Goals: Week 3: SLP Short Term Goal 1 (Week 3): STG=LTG (ELOS: discharge planned 1/20)  Skilled Therapeutic Interventions:Skilled ST services focused on language skills. SLP facilitated reading comprehension/reading acuity with complex language from RCBA and GORT, pt required min A fading to supervision A verbal cues. Pt demonstrated increase difficulty in divergent naming tasks, however was able to name and average of 5 members in each category with mod-min A semantic cues. SLP provided education pertaining to anomia and use of compensatory strategies. Pt was left in room with call bell within reach and chair alarm set. SLP recommends to continue skilled services.     Pain Pain Assessment Pain Score: 0-No pain  Therapy/Group: Individual Therapy  Ashantia Amaral  [facility] 07/07/2021, 3:53 PM

## 2021-07-07 NOTE — Progress Notes (Signed)
Physical Therapy Session Note  Patient Details  Name: Wayne Mendez MRN: [id number] Date of Birth: 1966/06/14  Today's Date: 07/07/2021 PT Individual Time: 0900-1000 PT Individual Time Calculation (min): 60 min   Short Term Goals: Week 3:  PT Short Term Goal 1 (Week 3): =LTG due to ELOS  Skilled Therapeutic Interventions/Progress Updates:    Pt received seated in w/c in room, agreeable to PT session. No complaints of pain. Pt's family friend Wayne Mendez present for therapy session and able to provide hands-on assist. Manual w/c propulsion to/from therapy gym at Supervision level with use of L UE/LE with cues to attend to R visual field. Sit to stand with min A to RW. Ambulation x 50 ft, 2 x 70 ft with RW with mod A and close w/c follow for safety with use of R hand orthosis on RW and DF assist ACE wrap to RLE. Pt continues to exhibit improved ability to advance RLE independently with decreased assist needed for blocking of R knee in stance. Pt does require manual assist to weight shift onto RLE in stance. Standing LLE 4" step-taps 3 x 10 reps with RW and use of mirror for visual feedback, mod to max A to block R knee and assist with weight shift onto RLE. With onset of fatigue pt exhibits increase in R knee buckling. Pt returned to room and left seated in w/c with needs in reach, quick release belt and chair alarm in place, [name] present.  Therapy Documentation Precautions:  Precautions Precautions: Fall Precaution Comments: Rt hemi Restrictions Weight Bearing Restrictions: No      Therapy/Group: Individual Therapy   Excell Seltzer, PT, DPT, CSRS  07/07/2021, 11:19 AM

## 2021-07-08 NOTE — Progress Notes (Signed)
Patient ID: Wayne Mendez, male   DOB: Feb 27, 1966, 56 y.o.   MRN: [id number] Met with the patient and wife to review rehab process, discharge process and plan of care. Discussed team conference notes, medications, secondary risk management, dietary modification recommendations for HTN (DASH diet/foods to limit salt intake), HLD (LDL 103), and prediabetes (A1C 5.7; ETOH use). Question about BB/Medication parameters; referred to the [facility]. Also reviewed access to My chart and follow up visits. Continue to follow along to discharge to address educational needs and facilitate preparation for discharge. Margarito Liner

## 2021-07-08 NOTE — Progress Notes (Signed)
Patient ID: Wayne Mendez, male   DOB: 09/19/1965, 56 y.o.   MRN: [id number]  SW faxed outpatient PT/OT/SLP to [facility] ([phone]).   SW followed up with pt wife to discuss family meeting. Reports after speaking with Case management, all questions have been answered clearly. SW reviewed outpatient therapy referral and provided contact information. SW explained all contact information for vendor and outpatient will be in d/c summary. No further questions/concerns reported.   Cecile Sheerer, MSW, LCSWA Office: [phone] Cell: [phone] Fax: [phone]

## 2021-07-08 NOTE — Progress Notes (Signed)
Speech Language Pathology Daily Session Note  Patient Details  Name: Wayne Mendez MRN: [id number] Date of Birth: 06/01/1966  Today's Date: 07/08/2021 SLP Individual Time: 0917-1000 SLP Individual Time Calculation (min): 43 min  Short Term Goals: Week 3: SLP Short Term Goal 1 (Week 3): STG=LTG (ELOS: discharge planned 1/20)  Skilled Therapeutic Interventions: Pt seen for skilled ST with focus on language skills, family friend present throughout. Pt downloaded and trialing 2 Aphasia applications on iPad to encourage independent practice to increase word finding abilities. Pt able to name stimulus with ~70% accuracy with min A verbal cues. Reviewed different levels of cueing provided in app (first letter, sentence completion, etc) if patient "stuck" on word. SLP facilitating simple rhyming exercise by providing mod A cues, encouraged pt to complete independently. Pt averaged ~8 items in abstract divergent naming category with min-mod A multimodal cues. Pt left in wheelchair with alarm belt activated and friend present for needs. Cont ST POC.   Pain Pain Assessment Pain Scale: 0-10 Pain Score: 0-No pain  Therapy/Group: Individual Therapy  Tacey Ruiz 07/08/2021, 11:51 AM

## 2021-07-08 NOTE — Progress Notes (Signed)
Occupational Therapy Session Note  Patient Details  Name: Wayne Mendez MRN: [id number] Date of Birth: [birth year]  Today's Date: 07/08/2021 OT Individual Time: 1045-1130 OT Individual Time Calculation (min): 45 min    Short Term Goals: Week 3:  OT Short Term Goal 1 (Week 3): STG=LTG 2/2 ELOS (continue working towards min A/supervsion LTG)  Skilled Therapeutic Interventions/Progress Updates:    Pt resting in w/c upon arrival with wife present. Initial focus on education with wife and answering questions in addition to recommendations. Discussed bathroom setup and home safety. Pt and wife verbalized understanding of all recommendations. Pt transported to ortho gym (time mgmt) and engaged in standing activity at Bits with emphasis on shifting priority to standing balance vs task completion. Pt verbalized understanding and demonstrated improvement with repeat of activity. Cotx with Recreational Therapist with focus on discharge planning, safety awareness, and community reintegration. Pt reaturned to room and reamined in w/c. Belt alarm activated.   Therapy Documentation Precautions:  Precautions Precautions: Fall Precaution Comments: Rt hemi Restrictions Weight Bearing Restrictions: No  Pain: Pain Assessment Pain Scale: 0-10 Pain Score: 0-No pain  Therapy/Group: Individual Therapy  Rich Brave 07/08/2021, 12:20 PM

## 2021-07-08 NOTE — Progress Notes (Signed)
PROGRESS NOTE   Subjective/Complaints:  Pt reports woke up at 2am and finally got back to sleep. Feeling good this AM.  Spasticity about the same- did ROM 5-6x yesterday.   ROS: Limited due to aphasia  Objective:   No results found. Recent Labs    07/06/21 0515  WBC 7.9  HGB 13.7  HCT 41.3  PLT 190    Recent Labs    07/06/21 0515 07/07/21 0510  NA 137 140  K 4.1 3.7  CL 106 105  CO2 28 25  GLUCOSE 100* 112*  BUN 21* 19  CREATININE 1.29* 1.17  CALCIUM 8.6* 9.0     Intake/Output Summary (Last 24 hours) at 07/08/2021 0754 Last data filed at 07/07/2021 1826 Gross per 24 hour  Intake 480 ml  Output --  Net 480 ml        Physical Exam: Vital Signs Blood pressure 122/75, pulse (!) 59, temperature 98.1 F (36.7 C), temperature source Oral, resp. rate 19, height 6' (1.829 m), SpO2 96 %.       General: awake, alert, appropriate, sitting up in bed; NAD HENT: conjugate gaze; oropharynx moist CV: regular rate; no JVD Pulmonary: CTA B/L; no W/R/R- good air movement GI: soft, NT, ND, (+)BS Psychiatric: appropriate Neurological: Ox3- but still aphasia at phrase level.  Developing tone/spasticity in RUE- MAS of 1+ in R bicep/triceps; trac ein R wrist and MAS of 2 in R shoulder, esp with external rotation. No change today    Mental Status: He is alert.     Comments: non-fluent aphasia but able to put together phrases and short sentences. Comprehension excellent.  Right central 7 and tongue deviation. Right arm and leg 1/2 sensation for LT and PP. RUE tr/5 deltoid but otherwise 0/5.  RLE 3- Hip flexion- doing better;  !  No hypertonicity. LUE and LLE 5/5 with normal sensation         Assessment/Plan: 1. Functional deficits which require 3+ hours per day of interdisciplinary therapy in a comprehensive inpatient rehab setting. Physiatrist is providing close team supervision and 24 hour management of active  medical problems listed below. Physiatrist and rehab team continue to assess barriers to discharge/monitor patient progress toward functional and medical goals  Care Tool:  Bathing    Body parts bathed by patient: Right arm, Left arm, Chest, Abdomen, Front perineal area, Right upper leg, Left upper leg, Right lower leg, Left lower leg, Face   Body parts bathed by helper: Buttocks     Bathing assist Assist Level: Minimal Assistance - Patient > 75%     Upper Body Dressing/Undressing Upper body dressing   What is the patient wearing?: Pull over shirt    Upper body assist Assist Level: Supervision/Verbal cueing    Lower Body Dressing/Undressing Lower body dressing      What is the patient wearing?: Underwear/pull up, Pants     Lower body assist Assist for lower body dressing: Minimal Assistance - Patient > 75%     Toileting Toileting Toileting Activity did not occur (Clothing management and hygiene only): N/A (no void or bm)  Toileting assist Assist for toileting: Moderate Assistance - Patient 50 - 74% Assistive Device  Comment: with steady   Transfers Chair/bed transfer  Transfers assist     Chair/bed transfer assist level: Contact Guard/Touching assist     Locomotion Ambulation   Ambulation assist   Ambulation activity did not occur: Safety/medical concerns  Assist level: 2 helpers Assistive device: Walker-rolling Max distance: 70'   Walk 10 feet activity   Assist  Walk 10 feet activity did not occur: Safety/medical concerns  Assist level: 2 helpers Assistive device: Walker-rolling   Walk 50 feet activity   Assist Walk 50 feet with 2 turns activity did not occur: Safety/medical concerns  Assist level: 2 helpers Assistive device: Walker-rolling    Walk 150 feet activity   Assist Walk 150 feet activity did not occur: Safety/medical concerns  Assist level: Dependent - Patient 0% Assistive device: Lite Gait    Walk 10 feet on uneven  surface  activity   Assist Walk 10 feet on uneven surfaces activity did not occur: Safety/medical concerns         Wheelchair     Assist Is the patient using a wheelchair?: Yes Type of Wheelchair: Manual    Wheelchair assist level: Supervision/Verbal cueing Max wheelchair distance: 150    Wheelchair 50 feet with 2 turns activity    Assist        Assist Level: Supervision/Verbal cueing   Wheelchair 150 feet activity     Assist      Assist Level: Supervision/Verbal cueing   Blood pressure 122/75, pulse (!) 59, temperature 98.1 F (36.7 C), temperature source Oral, resp. rate 19, height 6' (1.829 m), SpO2 96 %.    Medical Problem List and Plan: 1. 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  Impaired mobility:   Lovenox.              -antiplatelet therapy: N/A 3. Pain Management: Tylenol as needed 4. Mood: Provide emotional support             -antipsychotic agents: N/A 5. Neuropsych: This patient is? capable of making decisions on his own behalf. 6. Skin/Wound Care: Routine skin checks 7. Fluids/Electrolytes/Nutrition: Routine in and outs with follow-up chemistries 8.  Hypertension.  Norvasc 10 mg daily, lisinopril 20 mg twice daily, increase lopressor to 100mg  BID.    Vitals:   07/07/21 1924 07/08/21 0349  BP: 119/76 122/75  Pulse: 77 (!) 59  Resp: 16 19  Temp: 98.4 F (36.9 C) 98.1 F (36.7 C)  SpO2: 99% 96%   1/18- BP controlled- con't regimen 9.  Hyperlipidemia.  Hold due to IPH.  -Consider low dose statin at discharge 10. AKI: encouraging po, recent Cr 1.16  resolved 11. Suboptimal  potassium:    1/9 3.7, supplement with diet 12. Insomnia: melatonin 5mg  and grounds pass ordered to help reset circadian rhythm.  - trazodone effective 13. AKI 1/16- likely due to decreased intake- will recheck in AM and push fluids- might need IVFs if doesn't drink more. 1/17- resolved today after pushing fluids 14. Spasticity 1/17-  Developing tone of RUE/RLE- went over spasticity again and educated pt/friend on it, it's process- takes 1-2 years ot hit maximal progress and usually get worse 3-12 weeks- so I'm concerned going home without meds might put him at risk for contractures- will wait on baclofen for today and start ROM 10 reps 3-4x/day and reassess in AM.  1/18- will reassess in AM since hasn't done ROM yet this AM.         LOS: 19 days A FACE TO FACE EVALUATION WAS PERFORMED  Rolinda Impson 07/08/2021, 7:54 AM

## 2021-07-08 NOTE — Progress Notes (Signed)
Occupational Therapy Discharge Summary  Patient Details  Name: Wayne Mendez MRN: [id number] Date of Birth: [birth year]  Patient has met 7 of 8 long term goals due to improved activity tolerance, improved balance, postural control, ability to compensate for deficits, functional use of  RIGHT upper and RIGHT lower extremity, improved attention, improved awareness, and improved coordination.  Pt made steady progress with BADLs and functional tranfsers during this admission. Bathing with min A. UB dressing with supervision. LB dressing with CGA for standing balance. Toileting with mod A. Functional tranfsers with supervision/CGA. [name] has been present for education and provides the appropriate level of assistance/supervision. Pt continues to require min verbal cues for attending to Rt visual field and RUE. Pt's impulsivity has improved but requires min verbal cues for safety awareness during transfers and standing balance. Patient to discharge at [facility] Assist level.  Patient's care partner is independent to provide the necessary physical and cognitive assistance at discharge.    Reasons goals not met: Pt continues to need min assist for toilet transfers  Recommendation:  Patient will benefit from ongoing skilled OT services in outpatient setting to continue to advance functional skills in the area of BADL, iADL, and Reduce care partner burden.  Pt will  benefit from continued OT to progress ADL function to at least a modified independent level as well as increasing RUE functional use, and increasing overall safety and awareness.    Equipment: BSC  Reasons for discharge: treatment goals met and discharge from hospital  Patient/family agrees with progress made and goals achieved: Yes  OT Discharge Vision Baseline Vision/History: 1 Wears glasses Patient Visual Report: No change from baseline Vision Assessment?: No apparent visual deficits Perception  Perception:  Impaired Inattention/Neglect: Does not attend to right visual field;Does not attend to right side of body Praxis Praxis: Impaired Praxis Impairment Details: Motor planning Cognition Overall Cognitive Status: Within Functional Limits for tasks assessed Arousal/Alertness: Awake/alert Orientation Level: Oriented X4 Year: 2023 Month: January Day of Week: Correct Attention: Sustained Sustained Attention: Appears intact Memory: Appears intact Immediate Memory Recall: Sock;Blue;Bed Memory Recall Sock: Without Cue Memory Recall Blue: Without Cue Memory Recall Bed: Without Cue Awareness: Impaired Awareness Impairment: Emergent impairment Problem Solving: Impaired Problem Solving Impairment: Functional basic Safety/Judgment: Impaired Comments: slightly impulsive Sensation Sensation Light Touch: Impaired Detail Light Touch Impaired Details: Absent RUE Hot/Cold: Impaired by gross assessment Proprioception: Impaired Detail Proprioception Impaired Details: Absent RUE;Impaired RLE Coordination Gross Motor Movements are Fluid and Coordinated: No Fine Motor Movements are Fluid and Coordinated: No Coordination and Movement Description: Affected by Rt hemiparesis and balance deficits Finger Nose Finger Test: WNL Lt, unable to complete on the Rt Motor  Motor Motor: Hemiplegia;Abnormal postural alignment and control    Trunk/Postural Assessment  Cervical Assessment Cervical Assessment: Within Functional Limits Thoracic Assessment Thoracic Assessment: Within Functional Limits Lumbar Assessment Lumbar Assessment: Within Functional Limits  Extremity/Trunk Assessment RUE Assessment RUE Assessment: Exceptions to [facility] Passive Range of Motion (PROM) Comments: WNL RUE Body System: Neuro Brunstrum levels for arm and hand: Arm;Hand Brunstrum level for arm: Stage II Synergy is developing Brunstrum level for hand: Stage I Flaccidity LUE Assessment LUE Assessment: Within Functional  Limits Active Range of Motion (AROM) Comments: WNL   Leroy Libman 07/08/2021, 1:02 PM  Clyda Greener OTR/L

## 2021-07-08 NOTE — Discharge Summary (Signed)
Physician Discharge Summary  Patient ID: Wayne Mendez MRN: [id number] DOB/AGE: 12-06-1965 56 y.o.  Admit date: 06/19/2021 Discharge date: 07/10/2021  Discharge Diagnoses:  Principal Problem:   ICH (intracerebral hemorrhage) ([place]) Active Problems:   Expressive aphasia DVT prophylaxis Hypertension Hyperlipidemia AKI  Discharged Condition: Stable  Significant Diagnostic Studies: CT HEAD WO CONTRAST  Result Date: 06/16/2021 CLINICAL DATA:  Follow-up stroke EXAM: CT HEAD WITHOUT CONTRAST TECHNIQUE: Contiguous axial images were obtained from the base of the skull through the vertex without intravenous contrast. COMPARISON:  CT from the previous day. FINDINGS: Brain: There are changes consistent with parenchymal hemorrhage in the left parietal lobe deep white matter and extending into the basal ganglia and superiorly into the corona radiata on the left. The hematoma measures approximately 4 cm in greatest AP dimension. It measures approximately 3.1 cm in transverse dimension although evaluation is somewhat difficult due to the irregular shape of hematoma. It extends approximately 3.4 cm in the cranial caudad projection. No other focal areas of hemorrhage are seen. Mild mass effect is noted with minimal midline shift of approximately 3 mm. No other focal abnormality is noted. Vascular: No hyperdense vessel or unexpected calcification. Skull: Normal. Negative for fracture or focal lesion. Sinuses/Orbits: Paranasal sinuses demonstrate mucosal thickening in the ethmoid and frontal sinuses. Air-fluid level is noted in the right maxillary antrum. Other: None. IMPRESSION: Parenchymal hemorrhage in the left parietal lobe and extending into the left basal ganglia and corona radiata. Mild midline shift is noted from local mass effect. The overall appearance is stable from prior examination obtained under the name Wayne Mendez earlier on 06/15/2021 Air-fluid level is again seen in the right maxillary antrum stable  from the prior exam. These results will be called to the ordering clinician or representative by the Radiologist Assistant, and communication documented in the PACS or [employer]. Electronically Signed   By: Inez Catalina M.D.   On: 06/16/2021 00:51   MR BRAIN W WO CONTRAST  Result Date: 06/16/2021 CLINICAL DATA:  Hemorrhagic stroke, right-sided weakness, slurred speech EXAM: MRI HEAD WITHOUT AND WITH CONTRAST TECHNIQUE: Multiplanar, multiecho pulse sequences of the brain and surrounding structures were obtained without and with intravenous contrast. CONTRAST:  57mL GADAVIST GADOBUTROL 1 MMOL/ML IV SOLN COMPARISON:  No prior MRI, correlation is made with 06/16/2021 CT FINDINGS: Evaluation is somewhat limited by motion artifact. Brain: Redemonstrated intraparenchymal hemorrhage in the left basal ganglia, extending inferiorly into the left temporal lobe and superiorly into the left frontal lobe/corona radiata, measuring approximately 4.5 x 2.2 x 3.9 cm (AP x TR x CC) (series 6, image 17 and series 10, image 14), likely unchanged when accounting for differences in technique and scan plane. A fluid fluid level is noted within the hematoma (series 2, image 6). Mild surrounding T2 hyperintense signal, likely edema. Mild local mass effect, with approximately 3 mm of left-to-right midline shift at the level of the thalami, unchanged. Mild restricted diffusion surrounding the hematoma, which is not unexpected. No more distant restricted diffusion to suggest additional acute infarct. No other area of acute hemorrhage. No acute mass. No abnormal enhancement. Mild narrowing of the left lateral ventricle, no evidence of hydrocephalus. No extra-axial collection. Vascular: Normal flow voids. Skull and upper cervical spine: Normal marrow signal. Sinuses/Orbits: Mucosal thickening in the ethmoid air cells, frontal sinuses, and maxillary sinuses, with an air-fluid level in the right maxillary sinus. The orbits are  unremarkable. Other: Fluid throughout the mastoid air cells. IMPRESSION: 1. Grossly unchanged intraparenchymal hemorrhage centered in the  left basal ganglia, extending into the left frontal and temporal lobes, with mild surrounding edema and local mass effect, causing approximately 3 mm of left-to-right midline shift, unchanged. 2. Redemonstrated diffuse sinus mucosal thickening with air-fluid level in the right maxillary sinus. 3. No additional acute intracranial process. Electronically Signed   By: Merilyn Baba M.D.   On: 06/16/2021 17:09   DG Swallowing Func-Speech Pathology  Result Date: 06/17/2021 Table formatting from the original result was not included. Objective Swallowing Evaluation: Type of Study: MBS-Modified Barium Swallow Study  Patient Details Name: Wayne Mendez MRN: [id number] Date of Birth: [birth year] Today's Date: 06/17/2021 Time: SLP Start Time (ACUTE ONLY): 0830 -SLP Stop Time (ACUTE ONLY): 0843 SLP Time Calculation (min) (ACUTE ONLY): 13 min Past Medical History: Past Medical History: Diagnosis Date  Hypertension  Past Surgical History: Past Surgical History: Procedure Laterality Date  TYMPANOSTOMY TUBE PLACEMENT   HPI: Wayne Mendez is a 56 y.o. male PMHx HTN (untreated) with acute left basal ganglia intraparenchymal hemorrhage.  No data recorded  Recommendations for follow up therapy are one component of a multi-disciplinary discharge planning process, led by the attending physician.  Recommendations may be updated based on patient status, additional functional criteria and insurance authorization. Assessment / Plan / Recommendation Clinical Impressions 06/17/2021 Clinical Impression Pt demosntrates mild sensory impairment leading to delayed swallow initaition with all trials, despite this, even with mixed consistency and pressure to drink consecutively, at worst, pt only had trace flash penetration with thin liquids. No residuals. Pt able to attempt regualr solids and thin liquids. May need  reminders for lingual sweep/oral care after meals given sensory changes on the right. Suspect pt may have intermittent cough with intake if he is drinking very quickly.  Will f/u for tolerance. SLP Visit Diagnosis Dysphagia, oropharyngeal phase (R13.12) Attention and concentration deficit following -- Frontal lobe and executive function deficit following -- Impact on safety and function Mild aspiration risk   Treatment Recommendations 06/17/2021 Treatment Recommendations Therapy as outlined in treatment plan below   Prognosis 06/17/2021 Prognosis for Safe Diet Advancement Good Barriers to Reach Goals Language deficits;Cognitive deficits Barriers/Prognosis Comment -- Diet Recommendations 06/17/2021 SLP Diet Recommendations Regular solids;Thin liquid Liquid Administration via Cup;Straw Medication Administration Whole meds with liquid Compensations Slow rate;Small sips/bites Postural Changes Seated upright at 90 degrees   Other Recommendations 06/17/2021 Recommended Consults -- Oral Care Recommendations Oral care before and after PO Other Recommendations Have oral suction available Follow Up Recommendations Acute inpatient rehab (3hours/day) Assistance recommended at discharge Frequent or constant Supervision/Assistance Functional Status Assessment Patient has had a recent decline in their functional status and demonstrates the ability to make significant improvements in function in a reasonable and predictable amount of time. Frequency and Duration  06/17/2021 Speech Therapy Frequency (ACUTE ONLY) min 2x/week Treatment Duration 2 weeks   Oral Phase 06/17/2021 Oral Phase Impaired Oral - Pudding Teaspoon -- Oral - Pudding Cup -- Oral - Honey Teaspoon -- Oral - Honey Cup -- Oral - Nectar Teaspoon WFL Oral - Nectar Cup Right anterior bolus loss Oral - Nectar Straw Right anterior bolus loss Oral - Thin Teaspoon -- Oral - Thin Cup Right anterior bolus loss Oral - Thin Straw Right anterior bolus loss Oral - Puree WFL Oral -  Mech Soft -- Oral - Regular WFL Oral - Multi-Consistency -- Oral - Pill -- Oral Phase - Comment --  Pharyngeal Phase 06/17/2021 Pharyngeal Phase Impaired Pharyngeal- Pudding Teaspoon -- Pharyngeal -- Pharyngeal- Pudding Cup -- Pharyngeal -- Pharyngeal- Honey Teaspoon -- Pharyngeal --  Pharyngeal- Honey Cup -- Pharyngeal -- Pharyngeal- Nectar Teaspoon Delayed swallow initiation-vallecula Pharyngeal -- Pharyngeal- Nectar Cup Delayed swallow initiation-pyriform sinuses Pharyngeal -- Pharyngeal- Nectar Straw Delayed swallow initiation-pyriform sinuses Pharyngeal -- Pharyngeal- Thin Teaspoon -- Pharyngeal -- Pharyngeal- Thin Cup Delayed swallow initiation-pyriform sinuses;Penetration/Aspiration before swallow Pharyngeal Material enters airway, remains ABOVE vocal cords then ejected out;Material does not enter airway Pharyngeal- Thin Straw Delayed swallow initiation-pyriform sinuses;Penetration/Aspiration before swallow Pharyngeal Material enters airway, remains ABOVE vocal cords then ejected out;Material does not enter airway Pharyngeal- Puree Delayed swallow initiation-vallecula Pharyngeal -- Pharyngeal- Mechanical Soft -- Pharyngeal -- Pharyngeal- Regular Delayed swallow initiation-vallecula Pharyngeal -- Pharyngeal- Multi-consistency -- Pharyngeal -- Pharyngeal- Pill -- Pharyngeal -- Pharyngeal Comment --  No flowsheet data found. DeBlois, Katherene Ponto 06/17/2021, 10:38 AM                     ECHOCARDIOGRAM COMPLETE  Result Date: 06/15/2021    ECHOCARDIOGRAM REPORT   Patient Name:   AHMI NUNNERY Date of Exam: 06/15/2021 Medical Rec #:  [id number]    Height:       71.0 in Accession #:    [id number]   Weight:       216.0 lb Date of Birth:  1966-03-24     BSA:          2.179 m Patient Age:    48 years     BP:           141/92 mmHg Patient Gender: M            HR:           98 bpm. Exam Location:  Inpatient Procedure: 2D Echo, Cardiac Doppler and Color Doppler Indications:    Stroke  History:        Patient has no  prior history of Echocardiogram examinations.  Sonographer:    Glo Herring Referring Phys: [id number] [place]  1. Left ventricular ejection fraction, by estimation, is 65 to 70%. The left ventricle has normal function. The left ventricle has no regional wall motion abnormalities. Indeterminate diastolic filling due to E-A fusion.  2. Right ventricular systolic function is normal. The right ventricular size is mildly enlarged.  3. Left atrial size was mildly dilated.  4. The mitral valve is normal in structure. No evidence of mitral valve regurgitation. No evidence of mitral stenosis.  5. The aortic valve is normal in structure. Aortic valve regurgitation is not visualized. No aortic stenosis is present.  6. There is borderline dilatation of the aortic root, measuring 38 mm.  7. The inferior vena cava is normal in size with greater than 50% respiratory variability, suggesting right atrial pressure of 3 mmHg. FINDINGS  Left Ventricle: Left ventricular ejection fraction, by estimation, is 65 to 70%. The left ventricle has normal function. The left ventricle has no regional wall motion abnormalities. The left ventricular internal cavity size was normal in size. There is  no left ventricular hypertrophy. Indeterminate diastolic filling due to E-A fusion. Right Ventricle: The right ventricular size is mildly enlarged. No increase in right ventricular wall thickness. Right ventricular systolic function is normal. Left Atrium: Left atrial size was mildly dilated. Right Atrium: Right atrial size was normal in size. Pericardium: There is no evidence of pericardial effusion. Mitral Valve: The mitral valve is normal in structure. No evidence of mitral valve regurgitation. No evidence of mitral valve stenosis. Tricuspid Valve: The tricuspid valve is normal in structure. Tricuspid valve regurgitation is not demonstrated. No evidence of  tricuspid stenosis. Aortic Valve: The aortic valve is normal in  structure. Aortic valve regurgitation is not visualized. No aortic stenosis is present. Aortic valve mean gradient measures 6.0 mmHg. Aortic valve peak gradient measures 10.1 mmHg. Aortic valve area, by VTI measures 2.75 cm. Pulmonic Valve: The pulmonic valve was normal in structure. Pulmonic valve regurgitation is not visualized. No evidence of pulmonic stenosis. Aorta: The aortic root is normal in size and structure. There is borderline dilatation of the aortic root, measuring 38 mm. Venous: The inferior vena cava is normal in size with greater than 50% respiratory variability, suggesting right atrial pressure of 3 mmHg. IAS/Shunts: No atrial level shunt detected by color flow Doppler.  LEFT VENTRICLE PLAX 2D LVOT diam:     2.10 cm LV SV:         80 LV SV Index:   37 LVOT Area:     3.46 cm  RIGHT VENTRICLE RV Basal diam:  2.90 cm RV S prime:     20.00 cm/s LEFT ATRIUM           Index        RIGHT ATRIUM           Index LA Vol (A4C): 66.8 ml 30.66 ml/m  RA Area:     10.10 cm                                    RA Volume:   19.40 ml  8.90 ml/m  AORTIC VALVE                     PULMONIC VALVE AV Area (Vmax):    2.79 cm      PV Vmax:       1.45 m/s AV Area (Vmean):   2.72 cm      PV Peak grad:  8.4 mmHg AV Area (VTI):     2.75 cm AV Vmax:           159.00 cm/s AV Vmean:          113.000 cm/s AV VTI:            0.292 m AV Peak Grad:      10.1 mmHg AV Mean Grad:      6.0 mmHg LVOT Vmax:         128.00 cm/s LVOT Vmean:        88.900 cm/s LVOT VTI:          0.232 m LVOT/AV VTI ratio: 0.79  AORTA Ao Root diam: 3.80 cm  SHUNTS Systemic VTI:  0.23 m Systemic Diam: 2.10 cm Dani Gobble Croitoru MD Electronically signed by Sanda Klein MD Signature Date/Time: 06/15/2021/3:03:05 PM    Final    CT HEAD CODE STROKE WO CONTRAST  Result Date: 06/15/2021 CLINICAL DATA:  Code stroke. Neuro deficit, acute, stroke suspected. EXAM: CT HEAD WITHOUT CONTRAST TECHNIQUE: Contiguous axial images were obtained from the base of the  skull through the vertex without intravenous contrast. COMPARISON:  None. FINDINGS: Brain: No abnormality affects the brainstem or cerebellum. Right cerebral hemisphere shows mild chronic small-vessel change of the deep white matter. Old small vessel infarction in the right thalamus. Left hemispheres shows acute intraparenchymal hemorrhage within the basal ganglia and external capsule region with the hematoma measuring approximately 4.0 x 4.3 x 2.6 cm (volume = 23 cm^3). Mild surrounding edema. Mild mass effect but without midline shift. No intraventricular  or subarachnoid penetration. Vascular: There is atherosclerotic calcification of the major vessels at the base of the brain. Skull: Negative Sinuses/Orbits: Rhinosinusitis, most prominent affecting the maxillary sinus. Other: None IMPRESSION: 1. Intraparenchymal hemorrhage in the left basal ganglia and external capsule region, probably a hemorrhagic infarction. Focal hematoma with estimated volume of 23 cc. Mild surrounding edema and mass effect. No intraventricular or subarachnoid penetration. 2. Mild small vessel ischemic changes of the right hemispheric white matter and thalamus. 3. These results were communicated to Dr. Theda Sers at 1:26 pm on 06/15/2021 by text page via the [facility] messaging system. Electronically Signed   By: Nelson Chimes M.D.   On: 06/15/2021 13:27   CT ANGIO HEAD NECK W WO CM (CODE STROKE)  Result Date: 06/15/2021 CLINICAL DATA:  Stroke, follow-up, slurred speech, right-sided weakness. Left brain hemorrhage. EXAM: CT ANGIOGRAPHY HEAD AND NECK TECHNIQUE: Multidetector CT imaging of the head and neck was performed using the standard protocol during bolus administration of intravenous contrast. Multiplanar CT image reconstructions and MIPs were obtained to evaluate the vascular anatomy. Carotid stenosis measurements (when applicable) are obtained utilizing NASCET criteria, using the distal internal carotid diameter as the denominator.  CONTRAST:  53mL OMNIPAQUE IOHEXOL 350 MG/ML SOLN COMPARISON:  Head CT earlier same day FINDINGS: CTA NECK FINDINGS Aortic arch: Minimal aortic atherosclerotic calcification. Branching pattern is normal. Right carotid system: Common carotid artery widely patent to the bifurcation. Minimal soft plaque at the bifurcation. No stenosis. Cervical ICA widely patent. Left carotid system: Common carotid artery widely patent to the bifurcation. Carotid bifurcation is normal. Cervical ICA is normal. Vertebral arteries: Both vertebral artery origins are widely patent. Both vertebral arteries appear normal through the cervical region to the foramen magnum. Skeleton: Ordinary cervical spondylosis and facet arthritis. Other neck: No soft tissue mass or lymphadenopathy. Upper chest: Motion degraded. Question few patchy infiltrates in the right lung which could go along with pneumonia. Review of the MIP images confirms the above findings CTA HEAD FINDINGS Anterior circulation: Both internal carotid arteries are widely patent through the skull base and siphon regions. No siphon stenosis. The anterior and middle cerebral vessels are patent. No large vessel occlusion or proximal stenosis. No aneurysm or vascular malformation. Posterior circulation: Both vertebral arteries widely patent to the basilar. No basilar stenosis. Posterior circulation branch vessels are normal. Venous sinuses: Patent and normal. Anatomic variants: None significant. Review of the MIP images confirms the above findings IMPRESSION: No intracranial large vessel occlusion or stenosis. No aneurysm or vascular malformation. No significant carotid bifurcation disease. Posterior circulation widely patent. Few minimal patchy infiltrates suspected in the right lung which could go along with mild patchy pneumonia. There is considerable motion degradation on the limited upper chest images. Electronically Signed   By: Nelson Chimes M.D.   On: 06/15/2021 13:43    Labs:   Basic Metabolic Panel: Recent Labs  Lab 07/06/21 0515 07/07/21 0510  NA 137 140  K 4.1 3.7  CL 106 105  CO2 28 25  GLUCOSE 100* 112*  BUN 21* 19  CREATININE 1.29* 1.17  CALCIUM 8.6* 9.0    CBC: Recent Labs  Lab 07/06/21 0515  WBC 7.9  NEUTROABS 5.9  HGB 13.7  HCT 41.3  MCV 90.0  PLT 190    CBG: No results for input(s): GLUCAP in the last 168 hours.  Family history.  Positive for hypertension.  Denies any colon cancer esophageal cancer or rectal cancer  Brief HPI:   Severus Aguiar is a 56 y.o. right-handed  male with history of hypertension.  Per chart review lives with spouse.  Presented 06/15/2021 with acute onset of right side weakness and slurred speech.  Blood pressure 240/110.  CT/MRI showed intraparenchymal hemorrhage centered in the left basal ganglia extending into the left frontal and temporal lobes with surrounding mild edema and local mass-effect causing approximately 3 mm left-to-right midline shift.  Echocardiogram ejection fraction of 65 to 70% no wall motion abnormalities.  Admission chemistries unremarkable alcohol negative urine drug screen negative.  Neurology consulted follow-up conservative care close monitoring of blood pressure initially on Cleviprex.  He was cleared to begin subcutaneous heparin for DVT prophylaxis 06/17/2021.  Therapy evaluations completed due to patient decreased functional mobility right side weakness was admitted for a comprehensive rehab program   Hospital Course: Carlos Upadhyaya was admitted to rehab 06/19/2021 for inpatient therapies to consist of PT, ST and OT at least three hours five days a week. Past admission physiatrist, therapy team and rehab RN have worked together to provide customized collaborative inpatient rehab.  Pertain to patient's left basal ganglia and external capsule ICH due to uncontrolled hypertension remained stable participating with therapies.  Subcutaneous Lovenox for DVT prophylaxis no bleeding episodes.  Blood  pressure controlled on monitor on Norvasc lisinopril as well as adjustments and Lopressor and would need outpatient follow-up.  Hyperlipidemia with low-dose statin initiated at discharge.  AKI encouragement of fluids latest follow-up creatinine 1.17.   Blood pressures were monitored on TID basis and controlled     Rehab course: During patient's stay in rehab weekly team conferences were held to monitor patient's progress, set goals and discuss barriers to discharge. At admission, patient required max assist step pivot transfers minimal guard sit to stand   Physical exam.  Blood pressure 151/96 pulse 73 temperature 97.5 respirations 16 oxygen saturations 100% room air Constitutional.  No acute distress HEENT Head.  Normocephalic and atraumatic Eyes.  Pupils round and reactive to light no discharge without nystagmus Neck.  Supple nontender no JVD without thyromegaly Cardiac regular rate rhythm moderate extra sounds or murmur heard Abdomen.  Soft nontender positive bowel sounds without rebound Respiratory effort normal no respiratory distress without wheeze Musculoskeletal.  Normal range of motion Skin.  Warm and dry Neurologic.  Alert expressive language deficits, nonfluent.  Some receptive deficits as well likely apraxic.  Able to tell me where he was with verbal cueing and provide some basic biographical information.  Right central 7 and tongue deviation.  Right arm and leg 1/2 sensation for light touch and proprioception.  Right upper extremity trace deltoid but otherwise 0/5.  Right lower extremity 1/5 hip extension, HAD, trace to absent distally.  DTR's trace.  No resting tone.  Left upper left lower extremity 5/5  He/She  has had improvement in activity tolerance, balance, postural control as well as ability to compensate for deficits. He/She has had improvement in functional use RUE/LUE  and RLE/LLE as well as improvement in awareness.  Manual wheelchair propulsion to and from therapy  gym with supervision.  Ambulates 70 feet rolling walker moderate assist and close wheelchair to follow for safety.  OT intervention with focus OT intervention with focus on bed mobility squat pivot transfer standing balance bathing and shower level dressing with sit to stand from wheelchair.  All squat pivot transfers minimal assist contact-guard.  Standing balance contact-guard.  Speech therapy follow-up for aphasia reading comprehension and reading acuity with complex language required minimal assist fading to supervision assist verbal cues.  Full family teaching  completed plan discharged to home       Disposition: Discharge to home    Diet: Regular  Special Instructions: No driving smoking or alcohol  Medications at discharge 1.  Tylenol as needed 2.  Amantadine 100 mg p.o. daily 3.  Norvasc 10 mg p.o. daily 4.  B complex with vitamin C daily 5.  Lisinopril 20 mg p.o. twice daily 6.  Lopressor 100 mg p.o. twice daily 7.  Protonix 40 mg p.o. daily 8.  Trazodone 50 mg p.o. nightly 9.  Lipitor 10 mg p.o. daily  30-35 minutes were spent completing discharge summary and discharge planning  Discharge Instructions     Ambulatory referral to Neurology   Complete by: As directed    An appointment is requested in approximately: 4 weeks [place]   Ambulatory referral to Occupational Therapy   Complete by: As directed    Evaluate and treat   Ambulatory referral to Physical Medicine Rehab   Complete by: As directed    Moderate complexity follow-up 1 to 2 weeks [place]   Ambulatory referral to Physical Therapy   Complete by: As directed    Evaluate and treat   Ambulatory referral to Speech Therapy   Complete by: As directed    Evaluate and treat        Follow-up Information     Lovorn, Jinny Blossom, MD Follow up.   Specialty: Physical Medicine and Rehabilitation Why: Office to call for appointment Contact information: [id number] [address] [place] [address] [phone]                  Signed: Cathlyn Parsons 07/10/2021, 5:29 AM

## 2021-07-08 NOTE — Progress Notes (Signed)
Recreational Therapy Session Note  Patient Details  Name: Wayne Mendez MRN: [id number] Date of Birth: Dec 01, 1965 Today's Date: 07/08/2021  Pain: no c/o Skilled Therapeutic Interventions/Progress Updates: Session focused on discharge planning and awareness during co-treat with OT.  Pt with multiple questions/concerns about discharge including the logistic of outpatient therapies and their focus.  Answered provided.  Also discussed support system and their dynamics, how they are helpful and how they may need to be adjusted to better meet his an his family's needs.  Discussion included delegating tasks and limited the number of visitor and time visiting at home.  Reiterated information discussed at the stress management group last week for reenforcement.  Pt stated understanding.  Discussed a mind shift in thinking about his balance and mobility as his tasks to complete vs the activity/exercise being used to address this as pt is very task driven.  Pt stated understanding.  Therapy/Group: Co-Treatment  Wayne Mendez 07/08/2021, 2:42 PM

## 2021-07-08 NOTE — Progress Notes (Signed)
Physical Therapy Discharge Summary  Patient Details  Name: Wayne Mendez MRN: [id number] Date of Birth: 10-30-1965  Today's Date: 07/09/2021    Patient has met 8 of 9 long term goals due to improved activity tolerance, improved balance, improved postural control, increased strength, ability to compensate for deficits, improved attention, and improved awareness.  Patient to discharge at a wheelchair level Supervision.   Patient's care partner is independent to provide the necessary physical and cognitive assistance at discharge. [name] and son have completed hands-on family education and are safe to assist pt upon d/c home.  Reasons goals not met: Pt continues to require intermittent cues for safety negotiation in more crowded spaces for w/c mobility therefore is more at supervision vs mod I at w/c level.   Recommendation:  Patient will benefit from ongoing skilled PT services in outpatient setting to continue to advance safe functional mobility, address ongoing impairments in endurance, strength, balance, safety, independence with functional mobility, gait impairments, and minimize fall risk.  Equipment: RW and 18x18 w/c  Reasons for discharge: treatment goals met and discharge from hospital  Patient/family agrees with progress made and goals achieved: Yes  PT Discharge Precautions/Restrictions Precautions Precautions: Fall Precaution Comments: Rt hemi Restrictions Weight Bearing Restrictions: No Vital Signs Therapy Vitals Temp: 98.6 F (37 C) Pulse Rate: 72 Resp: 15 BP: 109/69 Patient Position (if appropriate): Sitting Oxygen Therapy SpO2: 95 % O2 Device: Room Air Pain Pain Assessment Pain Score: 0-No pain Pain Interference Pain Interference Pain Effect on Sleep: 0. Does not apply - I have not had any pain or hurting in the past 5 days Pain Interference with Therapy Activities: 1. Rarely or not at all Pain Interference with Day-to-Day Activities: 1. Rarely or not at  all Vision/Perception  Vision - History Ability to See in Adequate Light: 0 Adequate  Cognition Overall Cognitive Status: Within Functional Limits for tasks assessed Arousal/Alertness: Awake/alert Orientation Level: Oriented X4 Year: 2023 Month: January Day of Week: Correct Attention: Sustained Focused Attention: Appears intact Sustained Attention: Appears intact Selective Attention: Appears intact Memory: Appears intact Immediate Memory Recall: Sock;Blue;Bed Memory Recall Sock: Without Cue Memory Recall Blue: Without Cue Memory Recall Bed: Without Cue Awareness: Impaired Awareness Impairment: Anticipatory impairment Problem Solving: Impaired Problem Solving Impairment: Functional basic;Functional complex Safety/Judgment: Impaired Sensation Sensation Light Touch: Impaired Detail Light Touch Impaired Details: Absent RUE Hot/Cold: Impaired by gross assessment Proprioception: Impaired Detail Proprioception Impaired Details: Absent RUE;Impaired RLE Coordination Gross Motor Movements are Fluid and Coordinated: No Fine Motor Movements are Fluid and Coordinated: No Motor  Motor Motor: Hemiplegia;Abnormal postural alignment and control Motor - Skilled Clinical Observations: dense R sided hemiplegia Motor - Discharge Observations: RUE with mild tone, improved RLE mobility  Mobility Bed Mobility Bed Mobility: Rolling Right;Rolling Left;Supine to Sit;Sit to Supine Rolling Right: Supervision/verbal cueing Rolling Left: Supervision/Verbal cueing Supine to Sit: Supervision/Verbal cueing Sit to Supine: Supervision/Verbal cueing Transfers Transfers: Sit to Stand;Stand to Sit;Squat Pivot Transfers;Stand Pivot Transfers Sit to Stand: Minimal Assistance - Patient > 75% Stand to Sit: Minimal Assistance - Patient > 75% Stand Pivot Transfers: Minimal Assistance - Patient > 75% Squat Pivot Transfers: Supervision/Verbal cueing Transfer (Assistive device): None Locomotion   Gait Ambulation: Yes Gait Assistance: 2 Helpers;Moderate Assistance - Patient 50-74% Gait Distance (Feet): 40 Feet Assistive device: Rolling walker Pick up small object from the floor assist level: Total Assistance - Patient < 25%  Trunk/Postural Assessment  Cervical Assessment Cervical Assessment: Within Functional Limits Thoracic Assessment Thoracic Assessment: Within Functional Limits Lumbar Assessment Lumbar  Assessment: Within Functional Limits Postural Control Postural Control: Deficits on evaluation  Balance Balance Balance Assessed: Yes Static Sitting Balance Static Sitting - Balance Support: Left upper extremity supported Static Sitting - Level of Assistance: 6: Modified independent (Device/Increase time) Dynamic Sitting Balance Dynamic Sitting - Balance Support: During functional activity Dynamic Sitting - Level of Assistance: 5: Stand by assistance Static Standing Balance Static Standing - Balance Support: Bilateral upper extremity supported Static Standing - Level of Assistance: 5: Stand by assistance Dynamic Standing Balance Dynamic Standing - Balance Support: Bilateral upper extremity supported;During functional activity Dynamic Standing - Level of Assistance: 4: Min assist Extremity Assessment  RUE Assessment RUE Assessment: Exceptions to [facility] Passive Range of Motion (PROM) Comments: WNL RUE Body System: Neuro Brunstrum levels for arm and hand: Arm;Hand Brunstrum level for arm: Stage II Synergy is developing Brunstrum level for hand: Stage I Flaccidity LUE Assessment LUE Assessment: Within Functional Limits Active Range of Motion (AROM) Comments: WNL RLE Assessment RLE Assessment: Exceptions to [facility] Strength Comments: quad and hip flexor emerging, hamstring trace, DF 0/5      Rosita DeChalus  Excell Seltzer, PT, DPT, CSRS  07/09/2021, 4:45 PM

## 2021-07-08 NOTE — Progress Notes (Signed)
Occupational Therapy Session Note  Patient Details  Name: Wayne Mendez MRN: [id number] Date of Birth: 28-Nov-1965  Today's Date: 07/08/2021 OT Individual Time: 7989-2119 OT Individual Time Calculation (min): 63 min    Short Term Goals: Week 3:  OT Short Term Goal 1 (Week 3): STG=LTG 2/2 ELOS (continue working towards min A/supervsion LTG)  Skilled Therapeutic Interventions/Progress Updates:    Pt in bed to start with fam\ily present.  He was able to complete transfer to sitting EOB with mod demonstrational cueing and min assist secondary to impulsivity and trying to get up as fast as he could without awareness of the safe steps to complete this.  He was able to donn TEDS EOB with total assist and then slip on shoes at mod assist with elastic shoe laces.  Mod assist for stand pivot transfer to the wheelchair secondary to impulsivity and leaning to the left with the RLE lifting off of the ground secondary to slight tone.  Mod demonstrational cueing to slow down and take one step at a time.  He was then taken down to the therapy gym where he was able to complete work on RUE weightbearing in quadriped to start with max assist.  He needs max facilitation for sustained weightbearing over the RUE while lifting the LUE off the mat for brief periods of time.  He also worked on [employer] and RLE with sit to squat transitions EOB as well as for controlled leans onto the RUE in sitting with work on pushing back up to midline with mod assist, using the RUE and not the left trunk.  He also worked on pushing a tilted stool to target with the RUE and min assist.  Slight increased adduction tone noted in the LUE at times as well with greater demand on the arm.  Finished session with transfer back to the wheelchair stand pivot at max assist with return to the room.  Pt left sitting up with family present and with the call button and phone in reach.  Safety alarm belt in place and RUE supported on half lap tray.    Therapy  Documentation Precautions:  Precautions Precautions: Fall Precaution Comments: Rt hemi Restrictions Weight Bearing Restrictions: No  Pain: Pain Assessment Pain Scale: Faces Pain Score: 0-No pain ADL: See Care Tool Section for some details of mobility and selfcare  Therapy/Group: Individual Therapy  Ophia Shamoon OTR/L 07/08/2021, 10:34 AM

## 2021-07-09 ENCOUNTER — Other Ambulatory Visit (HOSPITAL_COMMUNITY): Payer: Self-pay

## 2021-07-09 MED ORDER — ATORVASTATIN CALCIUM 10 MG PO TABS
10.0000 mg | ORAL_TABLET | Freq: Every day | ORAL | 0 refills | Status: DC
  Filled 2021-07-09: qty 30, 30d supply, fill #0

## 2021-07-09 MED ORDER — AMANTADINE HCL 100 MG PO CAPS
100.0000 mg | ORAL_CAPSULE | Freq: Every day | ORAL | 0 refills | Status: DC
  Filled 2021-07-09: qty 30, 30d supply, fill #0

## 2021-07-09 MED ORDER — ACETAMINOPHEN 325 MG PO TABS: 650.0000 mg | ORAL_TABLET | ORAL | Status: DC | PRN

## 2021-07-09 MED ORDER — AMLODIPINE BESYLATE 10 MG PO TABS
10.0000 mg | ORAL_TABLET | Freq: Every day | ORAL | 0 refills | Status: DC
  Filled 2021-07-09: qty 30, 30d supply, fill #0

## 2021-07-09 MED ORDER — METOPROLOL TARTRATE 100 MG PO TABS
100.0000 mg | ORAL_TABLET | Freq: Two times a day (BID) | ORAL | 0 refills | Status: DC
  Filled 2021-07-09: qty 60, 30d supply, fill #0

## 2021-07-09 MED ORDER — LISINOPRIL 20 MG PO TABS
20.0000 mg | ORAL_TABLET | Freq: Two times a day (BID) | ORAL | 0 refills | Status: DC
  Filled 2021-07-09: qty 60, 30d supply, fill #0

## 2021-07-09 MED ORDER — TRAZODONE HCL 50 MG PO TABS
50.0000 mg | ORAL_TABLET | Freq: Every day | ORAL | 0 refills | Status: DC
  Filled 2021-07-09: qty 30, 30d supply, fill #0

## 2021-07-09 MED ORDER — B COMPLEX-C PO TABS
1.0000 | ORAL_TABLET | Freq: Every day | ORAL | 0 refills | Status: DC
  Filled 2021-07-09: qty 30, 30d supply, fill #0

## 2021-07-09 MED ORDER — PANTOPRAZOLE SODIUM 40 MG PO TBEC
40.0000 mg | DELAYED_RELEASE_TABLET | Freq: Every day | ORAL | 0 refills | Status: DC
  Filled 2021-07-09: qty 30, 30d supply, fill #0

## 2021-07-09 MED ORDER — SENNOSIDES-DOCUSATE SODIUM 8.6-50 MG PO TABS: 1.0000 | ORAL_TABLET | Freq: Two times a day (BID) | ORAL | Status: DC

## 2021-07-09 NOTE — Progress Notes (Signed)
Physical Therapy Session Note  Patient Details  Name: Wayne Mendez MRN: [id number] Date of Birth: 10/23/1965  Today's Date: 07/09/2021 PT Individual Time: 1336-1430 PT Individual Time Calculation (min): 54 min   Short Term Goals: Week 3:  PT Short Term Goal 1 (Week 3): =LTG due to ELOS  Skilled Therapeutic Interventions/Progress Updates: Pt presented in w/c agreeable to therapy. Lattie Haw, RT present through session. Session to focus on mobility in community environment and ambulation. Pt propelled to [facility] entrance with supervision and performed squat pivot transfer to booth with supervision. Pt required CGA to pivot out of booth due to poor positioning. Extensive discussion with pt and RT regarding community re-entry and energy conservation. Pt then participated in w/c propulsion outside in patio all performed with supervision. Pt noted to have improved awareness of R side and demonstrated improved safety negotiation around objects. Pt then propelled back in building including executive corridor for carpeted surface. Pt then returned to 4W and participated in gait training in hallway. Pt ambulated 60f and 449fwith RW and ace bandage for DF assist, and minA fading to modA with fatigue. Pt demonstrated significantly improved weight shifting, quad contraction in stance phase and improve erect posture as compared to previous time with this therapist. Pt states feels improvement with activity and encouraged by progress. Pt propelled back to room at end of session and remained in w/c with belt alarm on, call bell within reach and needs met.      Therapy Documentation Precautions:  Precautions Precautions: Fall Precaution Comments: Rt hemi Restrictions Weight Bearing Restrictions: No General:   Vital Signs: Therapy Vitals Temp: 98.6 F (37 C) Pulse Rate: 72 Resp: 15 BP: 109/69 Patient Position (if appropriate): Sitting Oxygen Therapy SpO2: 95 % O2 Device: Room Air Pain:   Mobility:    Locomotion :    Trunk/Postural Assessment :    Balance:   Exercises:   Other Treatments:      Therapy/Group: Individual Therapy  Meha Vidrine 07/09/2021, 4:21 PM

## 2021-07-09 NOTE — Progress Notes (Signed)
Occupational Therapy Session Note  Patient Details  Name: Wayne Mendez MRN: [id number] Date of Birth: 1965-11-22  Today's Date: 07/09/2021 OT Individual Time: 0800-0901 OT Individual Time Calculation (min): 61 min    Short Term Goals: Week 3:  OT Short Term Goal 1 (Week 3): STG=LTG 2/2 ELOS (continue working towards min A/supervsion LTG)  Skilled Therapeutic Interventions/Progress Updates:    Pt in bed to start with his best friend present.  He was requesting to go to the bathroom with completion of shower post toileting tasks.  Min assist for supine to sit with rolling to the right side and transition to sitting from sidelying.  Min assist for sit to stand with total assist for functional mobility to the toilet without an assistive device.  He was able to complete clothing management and toilet hygiene with min assist sit to stand as well.  Min assist for transfer over to the tub bench two steps away with use of the grab bars for support.  He completed all bathing at min assist level sit to stand with max hand over hand assist needed for using the RUE to wash the left arm.  Once he dried off, he was able to complete transfer stand pivot to the wheelchair with min assist for dressing tasks at the sink.  Supervision for donning a pullover shirt with min assist for donning all LB clothing except for TEDs at total assist and shoes at min assist.  He finished session with completion of oral hygiene in sitting with setup.  He was left with the call button and phone in reach and safety alarm in place.    Therapy Documentation Precautions:  Precautions Precautions: Fall Precaution Comments: Rt hemi Restrictions Weight Bearing Restrictions: No  Pain: Pain Assessment Pain Scale: Faces Pain Score: 0-No pain ADL: See Care Tool Section for some details of mobility and selfcare   Therapy/Group: Individual Therapy  Talbot Monarch OTR/L 07/09/2021, 12:15 PM

## 2021-07-09 NOTE — Plan of Care (Signed)
Problem: RH Expression Communication Goal: LTG Patient will express needs/wants via multi-modal(SLP) Description: LTG:  Patient will express needs/wants via multi-modal communication (gestures/written, etc) with cues (SLP) Outcome: Completed/Met Goal: LTG Patient will verbally express basic/complex needs(SLP) Description: LTG:  Patient will verbally express basic/complex needs, wants or ideas with cues  (SLP) Outcome: Completed/Met Goal: LTG Patient will increase word finding of common (SLP) Description: LTG:  Patient will increase word finding of common objects/daily info/abstract thoughts with cues using compensatory strategies (SLP). Outcome: Completed/Met   Problem: RH Attention Goal: LTG Patient will demonstrate this level of attention during functional activites (SLP) Description: LTG:  Patient will will demonstrate this level of attention during functional activites (SLP) Outcome: Completed/Met   Problem: RH Awareness Goal: LTG: Patient will demonstrate awareness during functional activites type of (SLP) Description: LTG: Patient will demonstrate awareness during functional activites type of (SLP) Outcome: Completed/Met   Problem: RH Swallowing Goal: LTG Patient will consume least restrictive diet using compensatory strategies with assistance (SLP) Description: LTG:  Patient will consume least restrictive diet using compensatory strategies with assistance (SLP) Outcome: Completed/Met

## 2021-07-09 NOTE — Progress Notes (Signed)
Recreational Therapy Session Note  Patient Details  Name: Wayne Mendez MRN: [id number] Date of Birth: [birth year] Today's Date: 07/09/2021  Pain: no c/o Skilled Therapeutic Interventions/Progress Updates: Session focused on simulated  Community reintegration at overall supervision-min assist level.  Discussion and practice with indoor/outdoor w/c mobility, squat pivot transfers w/c<-->booth seating, accessing public restrooms and energy conservation.  Pt propelled w/c throughout the hospital on carpeted and tiled surfaces, accessed public elevators, w/c mobility outdoors on uneven surfaces with supervision-min assist, min cues.  Pt with multiple questions about follow up therapies and anticipated continued progress.  Answers provided by LRT & PTA.  Therapy/Group: Co-Treatment   Wayne Mendez 07/09/2021, 3:54 PM

## 2021-07-09 NOTE — Progress Notes (Signed)
Speech Language Pathology Discharge Summary  Patient Details  Name: Wayne Mendez MRN: [id number] Date of Birth: 1965/09/16  Today's Date: 07/09/2021 SLP Individual Time: 1016-1100 SLP Individual Time Calculation (min): 44 min   Skilled Therapeutic Interventions:  Pt seen for skilled ST with focus on language goals and education, friend present throughout. Pt expressing excitement with upcoming discharge, states his mind has been racing all morning. Pt initially required extra time to increase focused attention to structured tasks, able to do Mod I. Pt benefiting from overall Supervision A cues for abstract category language activities. Pt continues to work very hard and gets frustrated with word finding challenges, will be an excellent candidate for Outpatient ST services. Pt and friend with no questions at this time, discussed independent exercises to complete at home with friends/family to promote word finding skills. Pt left in wheelchair with alarm belt activated and friend present for needs.  Patient has met 6 of 6 long term goals.  Patient to discharge at overall Supervision;Modified Independent level.   Clinical Impression/Discharge Summary:   Pt has made excellent progress during CIR stay having met 6 out of 6 long term goals. Pt is discharging at overall Supervision level for complex expressive language goals and Mod I for attention. Pt is communicating complex thoughts/ideas at conversation level benefiting from Supervision A to utilize word finding strategies and strategies to reduce frustration during errors. Pt's self-monitoring and error awareness/correction has improved from initial eval. [name], son and family friend have been present for many treatment sessions and have been educated on communication strategies and compensations that will be most effective for home environment. All demonstrate teach back and verbalized understanding. Recommend Outpatient Speech Therapy services to  continue to increase communication skills.   Care Partner:  Caregiver Able to Provide Assistance: Yes  Type of Caregiver Assistance: Physical;Cognitive  Recommendation:  Outpatient SLP  Rationale for SLP Follow Up: Maximize functional communication;Reduce caregiver burden   Reasons for discharge: Discharged from hospital;Treatment goals met   Patient/Family Agrees with Progress Made and Goals Achieved: Yes    Dewaine Conger 07/09/2021, 11:43 AM

## 2021-07-09 NOTE — Progress Notes (Signed)
Inpatient Rehabilitation Discharge Medication Review by a Pharmacist   A complete drug regimen review was completed for this patient to identify any potential clinically significant medication issues.   High Risk Drug Classes Is patient taking? Indication by Medication  Antipsychotic No    Anticoagulant No   Antibiotic No    Opioid No    Antiplatelet No    Hypoglycemics/insulin No    Vasoactive Medication Yes Lopressor, amlodipine, lisinopril- hypertension  Chemotherapy No    Other Yes Amantadine - speech recovery Trazodone - sleep        Type of Medication Issue Identified Description of Issue Recommendation(s)  Drug Interaction(s) (clinically significant)        Duplicate Therapy        Allergy        No Medication Administration End Date        Incorrect Dose        Additional Drug Therapy Needed        Significant med changes from prior encounter (inform family/care partners about these prior to discharge).      Other            Clinically significant medication issues were identified that warrant physician communication and completion of prescribed/recommended actions by midnight of the next day:  No   Name of provider notified for urgent issues identified:    Provider Method of Notification:    Pharmacist comments:    Time spent performing this drug regimen review (minutes):  30  Ulyses Southward, PharmD, [place], AAHIVP, CPP Infectious Disease Pharmacist 07/09/2021 9:44 AM

## 2021-07-09 NOTE — Progress Notes (Signed)
PROGRESS NOTE   Subjective/Complaints:  Pt reports doing ROM for spasticity, however didn't do as much ROM of shoulder since wasn't sure "how far to push it".   Did ROM this AM with BFF-  Asking about plan for beta blocker and when to take and when to hold. Nursing told him it was for pulse, not BP- explained in his case, it's for BP, his HR is good.     ROS: Limited due to aphasia, however denies C/D/N/V  Objective:   No results found. No results for input(s): WBC, HGB, HCT, PLT in the last 72 hours.   Recent Labs    07/07/21 0510  NA 140  K 3.7  CL 105  CO2 25  GLUCOSE 112*  BUN 19  CREATININE 1.17  CALCIUM 9.0     Intake/Output Summary (Last 24 hours) at 07/09/2021 1002 Last data filed at 07/09/2021 0745 Gross per 24 hour  Intake 720 ml  Output --  Net 720 ml        Physical Exam: Vital Signs Blood pressure 117/72, pulse 68, temperature 98.7 F (37.1 C), temperature source Oral, resp. rate 14, height 6' (1.829 m), SpO2 98 %.        General: awake, alert, appropriate, sitting on toilet- having BM; NAD HENT: conjugate gaze; oropharynx moist CV: regular rate; no JVD Pulmonary: CTA B/L; no W/R/R- good air movement GI: soft, NT, ND, (+)BS Psychiatric: appropriate Neurological: Ox3; however still aphasia noted at phrase/sentence level- better naming; mainly anomia at this time;  MAS 1- 1+ in R elbow and 1 in R wrist; 1+ to almost 2 in R shoulder esp with external rotation.     Mental Status: He is alert.     Comments: non-fluent aphasia but able to put together phrases and short sentences. Comprehension excellent.  Right central 7 and tongue deviation. Right arm and leg 1/2 sensation for LT and PP. RUE tr/5 deltoid but otherwise 0/5.  RLE 3- Hip flexion- doing better;  !  No hypertonicity. LUE and LLE 5/5 with normal sensation         Assessment/Plan: 1. Functional deficits which require 3+  hours per day of interdisciplinary therapy in a comprehensive inpatient rehab setting. Physiatrist is providing close team supervision and 24 hour management of active medical problems listed below. Physiatrist and rehab team continue to assess barriers to discharge/monitor patient progress toward functional and medical goals  Care Tool:  Bathing    Body parts bathed by patient: Right arm, Left arm, Chest, Abdomen, Front perineal area, Right upper leg, Left upper leg, Right lower leg, Left lower leg, Face   Body parts bathed by helper: Buttocks     Bathing assist Assist Level: Minimal Assistance - Patient > 75%     Upper Body Dressing/Undressing Upper body dressing   What is the patient wearing?: Pull over shirt    Upper body assist Assist Level: Supervision/Verbal cueing    Lower Body Dressing/Undressing Lower body dressing      What is the patient wearing?: Underwear/pull up, Pants     Lower body assist Assist for lower body dressing: Minimal Assistance - Patient > 75%  Toileting Toileting Toileting Activity did not occur Press photographer(Clothing management and hygiene only): N/A (no void or bm)  Toileting assist Assist for toileting: Moderate Assistance - Patient 50 - 74% Assistive Device Comment: with steady   Transfers Chair/bed transfer  Transfers assist     Chair/bed transfer assist level: Contact Guard/Touching assist     Locomotion Ambulation   Ambulation assist   Ambulation activity did not occur: Safety/medical concerns  Assist level: 2 helpers Assistive device: Walker-rolling Max distance: 100'   Walk 10 feet activity   Assist  Walk 10 feet activity did not occur: Safety/medical concerns  Assist level: 2 helpers Assistive device: Walker-rolling   Walk 50 feet activity   Assist Walk 50 feet with 2 turns activity did not occur: Safety/medical concerns  Assist level: 2 helpers Assistive device: Walker-rolling    Walk 150 feet  activity   Assist Walk 150 feet activity did not occur: Safety/medical concerns  Assist level: Dependent - Patient 0% Assistive device: Lite Gait    Walk 10 feet on uneven surface  activity   Assist Walk 10 feet on uneven surfaces activity did not occur: Safety/medical concerns         Wheelchair     Assist Is the patient using a wheelchair?: Yes Type of Wheelchair: Manual    Wheelchair assist level: Supervision/Verbal cueing Max wheelchair distance: 500'    Wheelchair 50 feet with 2 turns activity    Assist        Assist Level: Supervision/Verbal cueing   Wheelchair 150 feet activity     Assist      Assist Level: Supervision/Verbal cueing   Blood pressure 117/72, pulse 68, temperature 98.7 F (37.1 C), temperature source Oral, resp. rate 14, height 6' (1.829 m), SpO2 98 %.    Medical Problem List and Plan: 1. 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  Impaired mobility:   Lovenox.              -antiplatelet therapy: N/A 3. Pain Management: Tylenol as needed 4. Mood: Provide emotional support             -antipsychotic agents: N/A 5. Neuropsych: This patient is? capable of making decisions on his own behalf. 6. Skin/Wound Care: Routine skin checks 7. Fluids/Electrolytes/Nutrition: Routine in and outs with follow-up chemistries 8.  Hypertension.  Norvasc 10 mg daily, lisinopril 20 mg twice daily, increase lopressor to 100mg  BID.    Vitals:   07/09/21 0444 07/09/21 0852  BP: 117/84 117/72  Pulse: 72 68  Resp: 14   Temp: 98.7 F (37.1 C)   SpO2: 98%     1/19- holding Beta blocker when BP low- however I added recs to hold if BP <110 and give if 110 or over- also recommended getting BP cuff at home and checking in AM to determine if to give B blocker. Educated pt and friend.  9.  Hyperlipidemia.  Hold due to IPH.  -Consider low  dose statin at discharge 10. AKI: encouraging po, recent Cr 1.16  resolved 11. Suboptimal potassium:    1/9 3.7, supplement with diet 12. Insomnia: melatonin 5mg  and grounds pass ordered to help reset circadian rhythm.  - trazodone effective 13. AKI 1/16- likely due to decreased intake- will recheck in AM and push fluids- might need IVFs if doesn't drink more. 1/17- resolved today after pushing fluids 14. Spasticity 1/17- Developing tone of RUE/RLE- went over spasticity again and educated pt/friend on it, it's process- takes 1-2 years ot hit maximal progress and usually get worse 3-12 weeks- so I'm concerned going home without meds might put him at risk for contractures- will wait on baclofen for today and start ROM 10 reps 3-4x/day and reassess in AM.  1/18- will reassess in AM since hasn't done ROM yet this AM.  1/19- Spasticity a little better after ROM-after prolonged d/w pt and friend- and focusing on pt desire, will wait on Baclofen today, however to call me in 2-4 weeks or so, as spasticity will likely progress, and need spasticity meds- educated on baclofen that need to be with someone for first hour of first dose; al;so educated that spasticity progresses for 1-2 years; and ROM 4-5x/day can reduce need/ med dosing in future, so doesn't need as much- went over side effects of baclofen as compared to other meds and educated that's a TID med.        I spent a total of 51 minutes on total care- >50% coordination of care- speaking with pt and OT as well as friend with pt and separately- and PA's about d/c plans.   LOS: 20 days A FACE TO FACE EVALUATION WAS PERFORMED  Corrine Tillis 07/09/2021, 10:02 AM

## 2021-07-09 NOTE — Progress Notes (Signed)
Occupational Therapy Session Note  Patient Details  Name: Virgal Warmuth MRN: [id number] Date of Birth: 1966-06-10  Today's Date: 07/09/2021 OT Individual Time: 0923-3007 OT Individual Time Calculation (min): 29 min    Short Term Goals: Week 2:  OT Short Term Goal 1 (Week 2): Pt will complete 1/3 components of toileting with no more than Mod balance assistance OT Short Term Goal 1 - Progress (Week 2): Met OT Short Term Goal 2 (Week 2): Pt will complete 2 grooming tasks while standing at the sink using RW for standing support as needed OT Short Term Goal 2 - Progress (Week 2): Met OT Short Term Goal 3 (Week 2): Pt will perform toilet tranfser with min A and min verbal cues for sequencing and safety OT Short Term Goal 3 - Progress (Week 2): Met Week 3:  OT Short Term Goal 1 (Week 3): STG=LTG 2/2 ELOS (continue working towards min A/supervsion LTG)   Skilled Therapeutic Interventions/Progress Updates:    Pt greeted at time of session sitting up in wheelchair agreeable to OT session and no pain. Self propel wheelchair room <> main gym supervision. Squat pivot transfer wheelchair <> mat with Supervision and focused on NMR for RUE focused on 2x10 large ball roll outs, 2x10 weight bearing presses, and 1x10 lateral leans on mat edge with weight bearing, all to facilitate return in RUE. Discussed techniques to carryover at home for sensory integration and NMR for RUE. Back in room set up alarm on call bell in reach.   Therapy Documentation Precautions:  Precautions Precautions: Fall Precaution Comments: Rt hemi Restrictions Weight Bearing Restrictions: No     Therapy/Group: Individual Therapy  Viona Gilmore 07/09/2021, 12:58 PM

## 2021-07-10 NOTE — Progress Notes (Signed)
Anticipates discharge home today, no acute distress or discomfort, appears asleep the majority of the sift. Monitor and assisted call bell within reach on left side

## 2021-07-10 NOTE — Plan of Care (Addendum)
Pt greeted in bed with family at bedside, encouraged use of hand splint at HS and AFO. Went over medications, aware not to take the beta blocker when sbp is less than 110. Encourage to pick a time in am/pm get up go to bathroom and then take blood pressure and take pm as well, record and keep track for follow up appointment. Pt has equipment in room (RW and wheelchair). Informed to call front desk when wife arrives and PA will go over discharge paperwork. Pt excited to get home.

## 2021-07-10 NOTE — Progress Notes (Signed)
Inpatient Rehabilitation Care Coordinator Discharge Note   Patient Details  Name: Wayne Mendez MRN: [id number] Date of Birth: 08-Feb-1966   Discharge location: D/c to home with his wife and support from friend  Length of Stay: 20 days  Discharge activity level: wheelchair level Supervision  Home/community participation: Limited  Patient response FA:OZHYQM Literacy - How often do you need to have someone help you when you read instructions, pamphlets, or other written material from your doctor or pharmacy?: Never  Patient response VH:QIONGE Isolation - How often do you feel lonely or isolated from those around you?: Never  Services provided included: MD, RD, PT, OT, SLP, RN, CM, Pharmacy, Neuropsych, SW, TR  Financial Services:  Field seismologist Utilized: Private Insurance [facility]  Choices offered to/list presented to: Yes  Follow-up services arranged:  Outpatient, DME    Outpatient Servicies: [facility] for PT/OT/SLP DME : [employer] for RW, w/c with righ side lap tray    Patient response to transportation need: Is the patient able to respond to transportation needs?: Yes In the past 12 months, has lack of transportation kept you from medical appointments or from getting medications?: No In the past 12 months, has lack of transportation kept you from meetings, work, or from getting things needed for daily living?: No  Comments (or additional information):  Patient/Family verbalized understanding of follow-up arrangements:  Yes  Individual responsible for coordination of the follow-up plan: contact pt wife Rosann Auerbach [phone]  Confirmed correct DME delivered: Gretchen Short 07/10/2021    Gretchen Short

## 2021-07-10 NOTE — Progress Notes (Signed)
Recreational Therapy Discharge Summary Patient Details  Name: Wayne Mendez MRN: [id number] Date of Birth: 1965/10/30 Today's Date: 07/10/2021  Long term goals set: 1  Long term goals met: 1  Comments on progress toward goals: Pt has made great progress during LOS and is ready for discharge home today with family to provide/coordinate supervision/assistance.  TR sessions focused on leisure education, activity analysis & modifications, stress management and social, emotional, spiritual domains and their effect on overall health in addition to the physical.  Pt participated in a simulated community reintegration tasks at [place] w/c level.   Reasons goals not met: n/a  Equipment acquired: n/a  Reasons for discharge: discharge from hospital  Follow-up: Outpatient  Patient/family agrees with progress made and goals achieved: Yes  Arayah Krouse 07/10/2021, 8:38 AM

## 2021-07-10 NOTE — Progress Notes (Signed)
PROGRESS NOTE   Subjective/Complaints:  Pt asking if needs sleeping pill- makes him go to sleep immediately after taking, however wakes up around 1-2am nightly-  And then difficult to go back to sleep.   Askin gif should take at home.   ROS: Limited due to aphasia  Objective:   No results found. No results for input(s): WBC, HGB, HCT, PLT in the last 72 hours.   No results for input(s): NA, K, CL, CO2, GLUCOSE, BUN, CREATININE, CALCIUM in the last 72 hours.    Intake/Output Summary (Last 24 hours) at 07/10/2021 0823 Last data filed at 07/10/2021 0744 Gross per 24 hour  Intake 880 ml  Output --  Net 880 ml        Physical Exam: Vital Signs Blood pressure 109/75, pulse 63, temperature 98.2 F (36.8 C), resp. rate 14, height 6' (1.829 m), SpO2 97 %.         General: awake, alert, appropriate, BFF in room; sitting EOB using steady to get to toilet; NAD HENT: conjugate gaze; oropharynx moist CV: regular rate; no JVD Pulmonary: CTA B/L; no W/R/R- good air movement GI: soft, NT, ND, (+)BS Psychiatric: appropriate Neurological: aphasic at sentence level  MAS 1- 1+ in R elbow and 1 in R wrist; 1+ to almost 2 in R shoulder esp with external rotation.     Mental Status: He is alert.     Comments: non-fluent aphasia but able to put together phrases and short sentences. Comprehension excellent.  Right central 7 and tongue deviation. Right arm and leg 1/2 sensation for LT and PP. RUE tr/5 deltoid but otherwise 0/5.  RLE 3- Hip flexion- doing better;  !  No hypertonicity. LUE and LLE 5/5 with normal sensation         Assessment/Plan: 1. Functional deficits which require 3+ hours per day of interdisciplinary therapy in a comprehensive inpatient rehab setting. Physiatrist is providing close team supervision and 24 hour management of active medical problems listed below. Physiatrist and rehab team continue to assess  barriers to discharge/monitor patient progress toward functional and medical goals  Care Tool:  Bathing    Body parts bathed by patient: Right arm, Chest, Abdomen, Front perineal area, Right upper leg, Left upper leg, Right lower leg, Left lower leg, Face, Buttocks   Body parts bathed by helper: Buttocks Body parts n/a: Left arm   Bathing assist Assist Level: Minimal Assistance - Patient > 75%     Upper Body Dressing/Undressing Upper body dressing   What is the patient wearing?: Pull over shirt    Upper body assist Assist Level: Supervision/Verbal cueing    Lower Body Dressing/Undressing Lower body dressing      What is the patient wearing?: Underwear/pull up, Pants     Lower body assist Assist for lower body dressing: Minimal Assistance - Patient > 75%     Toileting Toileting Toileting Activity did not occur (Clothing management and hygiene only): N/A (no void or bm)  Toileting assist Assist for toileting: Minimal Assistance - Patient > 75% Assistive Device Comment: with steady   Transfers Chair/bed transfer  Transfers assist     Chair/bed transfer assist level: Supervision/Verbal cueing (squat  pivot)     Locomotion Ambulation   Ambulation assist   Ambulation activity did not occur: Safety/medical concerns  Assist level: 2 helpers Assistive device: Walker-rolling Max distance: 47ft   Walk 10 feet activity   Assist  Walk 10 feet activity did not occur: Safety/medical concerns  Assist level: 2 helpers Assistive device: Walker-rolling   Walk 50 feet activity   Assist Walk 50 feet with 2 turns activity did not occur: Safety/medical concerns  Assist level: 2 helpers Assistive device: Walker-rolling    Walk 150 feet activity   Assist Walk 150 feet activity did not occur: Safety/medical concerns  Assist level: Dependent - Patient 0% Assistive device: Lite Gait    Walk 10 feet on uneven surface  activity   Assist Walk 10 feet on uneven  surfaces activity did not occur: Safety/medical concerns         Wheelchair     Assist Is the patient using a wheelchair?: Yes Type of Wheelchair: Manual    Wheelchair assist level: Supervision/Verbal cueing Max wheelchair distance: >557ft    Wheelchair 50 feet with 2 turns activity    Assist        Assist Level: Supervision/Verbal cueing   Wheelchair 150 feet activity     Assist      Assist Level: Supervision/Verbal cueing   Blood pressure 109/75, pulse 63, temperature 98.2 F (36.8 C), resp. rate 14, height 6' (1.829 m), SpO2 97 %.    Medical Problem List and Plan: 1. 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  Impaired mobility:   Lovenox.              -antiplatelet therapy: N/A 3. Pain Management: Tylenol as needed 4. Mood: Provide emotional support             -antipsychotic agents: N/A 5. Neuropsych: This patient is? capable of making decisions on his own behalf. 6. Skin/Wound Care: Routine skin checks 7. Fluids/Electrolytes/Nutrition: Routine in and outs with follow-up chemistries 8.  Hypertension.  Norvasc 10 mg daily, lisinopril 20 mg twice daily, increase lopressor to 100mg  BID.    Vitals:   07/09/21 1521 07/09/21 1942  BP: 109/69 109/75  Pulse: 72 63  Resp: 15 14  Temp: 98.6 F (37 C) 98.2 F (36.8 C)  SpO2: 95% 97%   1/19- holding Beta blocker when BP low- however I added recs to hold if BP <110 and give if 110 or over- also recommended getting BP cuff at home and checking in AM to determine if to give B blocker. Educated pt and friend.  9.  Hyperlipidemia.   Hold due to IPH.  -Consider low dose statin at discharge 10. AKI: encouraging po, recent Cr 1.16  resolved 11. Suboptimal potassium:    1/9 3.7, supplement with diet 12. Insomnia: melatonin 5mg  and grounds pass ordered to help reset circadian rhythm.  - trazodone effective 1/20- will send home on trazodone, but suggested trying without it- but also  might need a higher dose- experiment with what works for him.  13. AKI 1/16- likely due to decreased intake- will recheck in AM and push fluids- might need IVFs if doesn't drink more. 1/17- resolved today after pushing fluids 14. Spasticity 1/17- Developing tone of RUE/RLE- went over spasticity again and educated pt/friend on it, it's process- takes 1-2 years ot hit maximal progress and usually get worse 3-12 weeks- so I'm concerned going home without meds might put him at risk for contractures- will wait on baclofen for today and start ROM 10 reps 3-4x/day and reassess in AM.  1/18- will reassess in AM since hasn't done ROM yet this AM.  1/19- Spasticity a little better after ROM-after prolonged d/w pt and friend- and focusing on pt desire, will wait on Baclofen today, however to call me in 2-4 weeks or so, as spasticity will likely progress, and need spasticity meds- educated on baclofen that need to be with someone for first hour of first dose; al;so educated that spasticity progresses for 1-2 years; and ROM 4-5x/day can reduce need/ med dosing in future, so doesn't need as much- went over side effects of baclofen as compared to other meds and educated that's a TID med.    1/20- to call me in clinic in next 2-4 weeks to discuss spasticity      LOS: 21 days A FACE TO FACE EVALUATION WAS PERFORMED  Wayne Mendez 07/10/2021, 8:23 AM

## 2021-07-13 ENCOUNTER — Ambulatory Visit: Payer: 59 | Attending: Physician Assistant | Admitting: Occupational Therapy

## 2021-07-13 ENCOUNTER — Telehealth: Payer: Self-pay

## 2021-07-13 ENCOUNTER — Other Ambulatory Visit: Payer: Self-pay

## 2021-07-13 DIAGNOSIS — R41840 Attention and concentration deficit: Secondary | ICD-10-CM

## 2021-07-13 DIAGNOSIS — I69151 Hemiplegia and hemiparesis following nontraumatic intracerebral hemorrhage affecting right dominant side: Secondary | ICD-10-CM | POA: Diagnosis present

## 2021-07-13 DIAGNOSIS — M6281 Muscle weakness (generalized): Secondary | ICD-10-CM

## 2021-07-13 DIAGNOSIS — R278 Other lack of coordination: Secondary | ICD-10-CM

## 2021-07-13 DIAGNOSIS — R2681 Unsteadiness on feet: Secondary | ICD-10-CM

## 2021-07-13 DIAGNOSIS — R4701 Aphasia: Secondary | ICD-10-CM | POA: Insufficient documentation

## 2021-07-13 DIAGNOSIS — R208 Other disturbances of skin sensation: Secondary | ICD-10-CM | POA: Diagnosis present

## 2021-07-13 DIAGNOSIS — R471 Dysarthria and anarthria: Secondary | ICD-10-CM | POA: Diagnosis present

## 2021-07-13 DIAGNOSIS — R41841 Cognitive communication deficit: Secondary | ICD-10-CM | POA: Diagnosis present

## 2021-07-13 DIAGNOSIS — R29818 Other symptoms and signs involving the nervous system: Secondary | ICD-10-CM | POA: Diagnosis present

## 2021-07-13 NOTE — Therapy (Signed)
[facility] [address] [place], [place], [address] Phone: [phone]   Fax:  [phone]  Occupational Therapy Evaluation  Patient Details  Name: Wayne Mendez MRN: [id number] Date of Birth: 04/26/1966 Referring Provider (OT): Mariam DollarAngiulli, Daniel PA   Encounter Date: 07/13/2021   OT End of Session - 07/13/21 1224     Visit Number 1    Number of Visits 10    Date for OT Re-Evaluation 10/06/21   10 visits over 12 weeks   Authorization Type U[employer]    Authorization Time Period $30 copay, VL: 30 hard max combined (ST, PT, OT)    Authorization - Number of Visits 10    OT Start Time 1100    OT Stop Time 1155    OT Time Calculation (min) 55 min    Activity Tolerance Patient tolerated treatment well    Behavior During Therapy Wayne Mendez for tasks assessed/performed;Impulsive             Past Medical History:  Diagnosis Date   Hypertension     Past Surgical History:  Procedure Laterality Date   TYMPANOSTOMY TUBE PLACEMENT      There were no vitals filed for this visit.   Subjective Assessment - 07/13/21 1216     Subjective  Pt is a 56 year old male that presents to Neuro OPOT s/p L basal ganglia intraperenchymal hemmorrhage on 06/15/21. Pt with PMH of HTN (blood pressure 240/110 on admission). Pt accompanied by spouse and best friend. Pt completed evaluation at wheelchair level and demonstrated ability to negotiate w/c on flat surface within clinic. Pt reports primary goals ot get arm working and walking. Pt has hard max visit limit of 30 visits combined with ST, OT and PT. Pt lives with family and was independent prior to this episode and working full time.    Patient is accompanied by: Family member   spouse and best friend   Pertinent History HTN    Limitations Fall Risk, impulsive    Patient Stated Goals walk, get arm working    Currently in Pain? No/denies    Pain Score 0-No pain               [facility] OT  Assessment - 07/13/21 1107       Assessment   Medical Diagnosis Nontraumatic subcortical hemorrhage of left cerebral hemisphere    Referring Provider (OT) Mariam DollarAngiulli, Daniel PA    Onset Date/Surgical Date 06/15/21    Hand Dominance Right    Prior Therapy Inpatient Rehab      Precautions   Precautions Fall    Required Braces or Orthoses Other Brace/Splint    Other Brace/Splint prefab resting hand at night      Home  Environment   Family/patient expects to be discharged to: Private residence    Living Arrangements Spouse/significant other    Available Help at Discharge Family    Type of Home House    Home Access Ramped entrance    Home Layout Able to live on main level with bedroom/bathroom    Bathroom Shower/Tub Walk-in Shower   lip   Home Equipment Wheelchair - manual;Shower seat;Hand held shower head;Grab bars - tub/shower;Grab bars - toilet      Prior Function   Level of Independence Independent    Vocation Full time employment    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    Leisure going to the gym, working      ADL   Eating/Feeding Needs assist  with cutting food    Grooming Modified independent    Upper Body Bathing Minimal assistance    Lower Body Bathing Minimal assistance    Upper Body Dressing Increased time    Lower Body Dressing Minimal assistance   assistance for pulling pants up some   Toilet Transfer Min guard    Toileting - Clothing Manipulation Maximal assistance    Toileting -  Hygiene Maximal assistance    Tub/Shower Transfer Supervision/safety    Secretary/administrator bars      IADL   Shopping Needs to be accompanied on any shopping trip    Prior Level of Function Light Housekeeping spouse completes these tasks    Light Housekeeping Does not participate in any housekeeping tasks    Meal Prep Able to complete simple cold meal and snack prep    Community Mobility Relies on family or friends for transportation    Medication Management  Takes responsibility if medication is prepared in advance in seperate dosage    Financial Management Dependent      Written Expression   Dominant Hand Right    Handwriting Not legible      Vision - History   Baseline Vision Wears glasses only for reading    Additional Comments pt denies any changes      Cognition   Overall Cognitive Status Impaired/Different from baseline    Cognition Comments continue to assess - pt and spouse report changes but has gotten better      Observation/Other Assessments   Focus on Therapeutic Outcomes (FOTO)  was not caputured by front desk at eval                                OT Short Term Goals - 07/14/21 0857       OT SHORT TERM GOAL #1   Title Pt will be independent with initial HEP for RUE stretching and NMR    Time 4    Period Weeks    Status New    Target Date 08/11/21      OT SHORT TERM GOAL #2   Title Pt will be verbalize understanding of sesnory strategies for increased safety with sensation impairments with RUE    Time 4    Period Weeks    Status New      OT SHORT TERM GOAL #3   Title Pt will report completing all dressing with set up assistance only    Baseline needing assistance for pulling pants up    Time 4    Period Weeks    Status New               OT Long Term Goals - 07/14/21 0904       OT LONG TERM GOAL #1   Title Pt will be independent with any updated HEPs    Time 12    Period Weeks    Status New    Target Date 10/06/21      OT LONG TERM GOAL #2   Title Pt will be independent with any splint wear and care PRN    Baseline currently pre fab resting hand splint for RUE    Time 12    Period Weeks    Status New      OT LONG TERM GOAL #3   Title Pt will report taking self to bathroom and complete toileting with mod I    Time  12    Period Weeks    Status New      OT LONG TERM GOAL #4   Title Pt will demonstrate active shoulder flexion of 15 degrees or greater in RUE for  progressing towards low level reaching    Baseline trace activation    Time 12    Period Weeks    Status New      OT LONG TERM GOAL #5   Title Pt will verbalize understanding of work related tasks and skills and return to work recommendations.    Time 12    Period Weeks    Status New                   Plan - 07/13/21 1219     Clinical Impression Statement Pt is a 56 year old male that presents to Neuro OPOT s/p L basal ganglia intraperenchymal hemmorrhage on 06/15/21. Pt with PMH of HTN (blood pressure 240/110 on admission). Pt presents with RUE hemiplegia with significant decreased functional use of RUE, unsteadiness on feet, deficits with attention, multitasking and problem solving. Skilled occupational therapy is recommended to target listed areas of deficit and increase indepedence with ADLs and IADLs.    OT Occupational Profile and History Problem Focused Assessment - Including review of records relating to presenting problem    Occupational performance deficits (Please refer to evaluation for details): ADL's;IADL's;Work;Leisure;Rest and Sleep    Body Structure / Function / Physical Skills ADL;Balance;Coordination;ROM;IADL;Proprioception;UE functional use;Sensation;FMC;Strength;GMC;Tone;Decreased knowledge of use of DME    Cognitive Skills Attention;Problem Solve;Safety Awareness;Sequencing    Rehab Potential Good    Clinical Decision Making Limited treatment options, no task modification necessary    Comorbidities Affecting Occupational Performance: None    Modification or Assistance to Complete Evaluation  No modification of tasks or assist necessary to complete eval    OT Frequency 1x / week    OT Duration 12 weeks   10 visits over 12 weeks   OT Treatment/Interventions Self-care/ADL training;Aquatic Therapy;Electrical Stimulation;Ultrasound;Moist Heat;Fluidtherapy;Therapeutic exercise;Neuromuscular education;Manual Therapy;Patient/family education;Functional Mobility  Training;Visual/perceptual remediation/compensation;Passive range of motion;Cognitive remediation/compensation;Therapeutic activities;Splinting;DME and/or AE instruction    Plan self PROM, RUE NMR    Consulted and Agree with Plan of Care Patient;Family member/caregiver    Family Member Consulted spouse, Bethann Berkshire             Patient will benefit from skilled therapeutic intervention in order to improve the following deficits and impairments:   Body Structure / Function / Physical Skills: ADL, Balance, Coordination, ROM, IADL, Proprioception, UE functional use, Sensation, FMC, Strength, GMC, Tone, Decreased knowledge of use of DME Cognitive Skills: Attention, Problem Solve, Safety Awareness, Sequencing     Visit Diagnosis: 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 [address] [place], [place], [address] Phone: [phone]   Fax:  [phone]  Name: Milad Bublitz MRN: [id number] Date of Birth: 1966-01-30

## 2021-07-13 NOTE — Telephone Encounter (Signed)
Wayne Mendez called seeking more information about the Stroke Clinic for Wayne Mendez. She also stated he is very exhausted and sleepy between 11:30 & 12:30 PM. Could it be the medicine he is taking or is it the stroke? She would like to speak you it its possible.  Call back phone [phone].

## 2021-07-13 NOTE — Telephone Encounter (Signed)
Transitional Care Call--who you spoke with Wayne Mendez [phone] (Wife)   Are you/is patient experiencing any problems since coming home? None.  Are there any questions regarding any aspect of care?  Should he be seen at the stroke clinic? (Question sent to [name]). Are there any questions regarding medications administration/dosing? No but his is becomes very sleepy between 11:30 & 12:30 daily. Are meds being taken as prescribed? Yes. Patient should review meds with caller to confirm. Medications confirmed.  Have there been any falls? No falls. Right arm still lacks tone. Has Home Health been to the house and/or have they contacted you? Yes. If not, have you tried to contact them? No need. Can we help you contact them? N/A. Are bowels and bladder emptying properly? Yes. Are there any unexpected incontinence issues? None. If applicable, is patient following bowel/bladder programs?N/A Any fevers, problems with breathing, unexpected pain?No.  Are there any skin problems or new areas of breakdown? No. Has the patient/family member arranged specialty MD follow up (ie cardiology/neurology/renal/surgical/etc)? Yes. Can we help arrange? No need.  Does the patient need any other services or support that we can help arrange? No.  Are caregivers following through as expected in assisting the patient?Yes        11. Has the patient quit smoking, drinking alcohol, or using drugs as recommended? Per caller he does not take part in such activities.   Appointment Date/Time/ Arrival time/ and who they are seeing [name] NP on 2/2/023 at 1:00 PM. [address]

## 2021-07-14 ENCOUNTER — Ambulatory Visit: Payer: 59 | Admitting: Physical Therapy

## 2021-07-14 ENCOUNTER — Encounter: Payer: Self-pay | Admitting: Occupational Therapy

## 2021-07-14 ENCOUNTER — Encounter: Payer: Self-pay | Admitting: Physical Therapy

## 2021-07-14 VITALS — BP 142/90

## 2021-07-14 DIAGNOSIS — I69151 Hemiplegia and hemiparesis following nontraumatic intracerebral hemorrhage affecting right dominant side: Secondary | ICD-10-CM

## 2021-07-14 DIAGNOSIS — R278 Other lack of coordination: Secondary | ICD-10-CM

## 2021-07-14 DIAGNOSIS — R29818 Other symptoms and signs involving the nervous system: Secondary | ICD-10-CM

## 2021-07-14 DIAGNOSIS — M6281 Muscle weakness (generalized): Secondary | ICD-10-CM

## 2021-07-14 DIAGNOSIS — R2681 Unsteadiness on feet: Secondary | ICD-10-CM

## 2021-07-14 NOTE — Therapy (Signed)
[facility] [address] [place], [place], [address] Phone: [phone]   Fax:  [phone]  Physical Therapy Evaluation  Patient Details  Name: Wayne DenseKevin Mendez MRN: [id number] Date of Birth: 04/12/1966 Referring Provider (PT): Charlton AmorAngiulli, Daniel J, PA-C   Encounter Date: 07/14/2021   PT End of Session - 07/14/21 1421     Visit Number 1    Number of Visits 11    Date for PT Re-Evaluation 10/12/21    Authorization Type UHC - $30 copay, VL: 30 hard max combined (ST, PT, OT)    PT Start Time 1017    PT Stop Time 1108    PT Time Calculation (min) 51 min    Equipment Utilized During Treatment Gait belt    Activity Tolerance Patient tolerated treatment well    Behavior During Therapy [facility] for tasks assessed/performed;Impulsive             Past Medical History:  Diagnosis Date   Hypertension     Past Surgical History:  Procedure Laterality Date   TYMPANOSTOMY TUBE PLACEMENT      Vitals:   07/14/21 1025  BP: (!) 142/90      Subjective Assessment - 07/14/21 1025     Subjective Pt with s/p L basal ganglia intraperenchymal hemmorrhage on 06/15/21. Received inpatient rehab for PT, OT, and ST. Discharged home from hospital on 07/10/21. Arrives in a w/c. Transfers are going well at home with squat pivot. Best friend installed grab bars and has a ramp. No falls. No issues with getting in and out of bed. Has been dressing himself at home.    Patient is accompained by: Family member   wife Wayne Auerbachrish, best friend Investment banker, corporateDoug   Pertinent History HTN.    Limitations Walking;House hold activities    Patient Stated Goals Wants to get out of his w/c, wants to be like he was before.    Currently in Pain? No/denies                [facility] PT Assessment - 07/14/21 1036       Assessment   Medical Diagnosis Nontraumatic subcortical hemorrhage of left cerebral hemisphere    Referring Provider (PT) Charlton AmorAngiulli, Daniel J, PA-C    Onset  Date/Surgical Date 06/15/21    Hand Dominance Right    Prior Therapy Inpatient Rehab      Precautions   Precautions Fall    Precaution Comments R hemiplegia    Required Braces or Orthoses Other Brace/Splint    Other Brace/Splint prefab resting hand at night      Balance Screen   Has the patient fallen in the past 6 months No    Has the patient had a decrease in activity level because of a fear of falling?  No    Is the patient reluctant to leave their home because of a fear of falling?  No      Home Environment   Living Environment Private residence    Living Arrangements Spouse/significant other   has help from best friend   Available Help at Discharge Family    Type of Home House    Home Access Ramped entrance    Home Layout Able to live on main level with bedroom/bathroom;Two level    Home Equipment Wheelchair - manual;Grab bars - tub/shower;Grab bars - toilet;Walker - 2 wheels;Tub bench      Prior Function   Level of Independence Independent    Vocation Full time employment  Vocation Requirements COO for [employer]    Leisure playing basketball, going to the gym      Cognition   Overall Cognitive Status Impaired/Different from baseline   pt to have speech therapy eval tomorrow     Observation/Other Assessments   Focus on Therapeutic Outcomes (FOTO)  was not captured by front desk      Sensation   Light Touch Impaired Detail    Light Touch Impaired Details Impaired RLE    Proprioception Impaired Detail    Proprioception Impaired Details Impaired RLE   at ankles; able to detect ankle PF slightly, not able to detect ankle DF   Additional Comments able to detect generalized light touch, less pronounced with RLE      Coordination   Gross Motor Movements are Fluid and Coordinated No    Coordination and Movement Description due to R hemiparesis      ROM / Strength   AROM / PROM / Strength Strength      Strength   Strength Assessment Site Hip;Knee;Ankle     Right/Left Hip Right;Left    Right Hip Flexion 2+/5    Left Hip Flexion 5/5    Right/Left Knee Right;Left    Right Knee Flexion 1/5    Right Knee Extension 3-/5    Right/Left Ankle Right;Left    Right Ankle Dorsiflexion 0/5    Right Ankle Plantar Flexion 1/5      Bed Mobility   Bed Mobility Not assessed   did not have time to assess during eval, on 07/09/21, pt performing bed mobility with supervision in hospital     Transfers   Transfers Sit to Stand;Stand to Sit;Squat Pivot Transfers    Sit to Stand 4: Min assist    Sit to Stand Details Verbal cues for sequencing;Verbal cues for technique;Verbal cues for precautions/safety;Verbal cues for safe use of DME/AE;Visual cues/gestures for precautions/safety;Visual cues/gestures for sequencing    Sit to Stand Details (indicate cue type and reason) Initial assist for proper RLE set up, cued for proper LUE to press from mat vs. RW. In standing needing assist to steady as pt with weight shift primarily on RLE. Needs assist to put R hand in orthosis with standing    Stand to Sit 4: Min assist    Stand to Sit Details (indicate cue type and reason) Verbal cues for technique;Verbal cues for sequencing    Stand to Sit Details Cues to reach back for slowed descent    Squat Pivot Transfers 4: Min guard   from w/c > mat table     Ambulation/Gait   Ambulation/Gait Yes    Ambulation/Gait Assistance 3: Mod assist;4: Min assist   +2 therapist assist with close w/c follow for safety   Ambulation/Gait Assistance Details Used R Ottobock PLS AFO, ambulated 5315' first with one therapist assisting with trunk control and 2nd therapist helping clear RLE (providing mod A) and pt's shoe getting stuck on floor. Had a seated rest break and put on blue shoe cover to simulate blue toe cap. 2nd bout of gait, one therapist at pt's trunk/pelvis to facilitate weight shifting to RLE for incr step length on LLE and 2nd therapist on stool helping bring RLE through and for placement  during swing phase. Pt with improvement using blue shoe cover. No episodes of knee buckling throughout gait. Close w/c follow provided by pt's friend. 2nd bout needing min A. Also assisting keeping R hand in orthosis    Ambulation Distance (Feet) 15 Feet  x1, 40 x 1   Assistive device Rolling walker   with R hand orthosis   Gait Pattern Step-to pattern;Decreased step length - left;Decreased stance time - right;Decreased hip/knee flexion - right;Decreased dorsiflexion - right;Poor foot clearance - right    Ambulation Surface Level;Indoor                        Objective measurements completed on examination: See above findings.                PT Education - 07/14/21 1421     Education Details Clinical findings, POC, pt with 30 visit limit between all therapies, will schedule for all 3 disciplines tomorrow after pt has speech therapy eval.    Person(s) Educated Patient;Spouse   pt's best friend   Methods Explanation    Comprehension Verbalized understanding              PT Short Term Goals - 07/14/21 1542       PT SHORT TERM GOAL #1   Title Pt will be independent with initial HEP with family supervision/assist in order to build upon functional gains made in therapy. ALL STGS DUE 08/11/21    Time 4    Period Weeks    Status New    Target Date 08/11/21      PT SHORT TERM GOAL #2   Title Pt will perform squat pivot transfer from w/c <> mat table with supervision in improve independence with transfers.    Baseline min guard    Time 4    Period Weeks    Status New      PT SHORT TERM GOAL #3   Title Pt will perform sit <> stands with RW with min guard in order to demo improved functional transfers and balance.    Baseline Min A    Time 4    Period Weeks    Status New      PT SHORT TERM GOAL #4   Title Pt will ambulate at least 48' with RW and R hand orthosis with min A x1 therapist in order to improve functional ambulation.    Time 4    Period  Weeks    Status New               PT Long Term Goals - 07/14/21 1545       PT LONG TERM GOAL #1   Title Pt will be independent with final HEP with family supervision/assist in order to build upon functional gains made in therapy. ALL LTGS DUE 09/08/21    Time 8    Period Weeks    Status New    Target Date 09/08/21      PT LONG TERM GOAL #2   Title Pt will perform stand pivot transfer from w/c <> mat table with RW and supervision in improve independence with transfers.    Baseline squat pivot with CGA    Time 8    Period Weeks    Status New      PT LONG TERM GOAL #3   Title Pt will perform sit <> stands with RW and supervision in order to demo improved functional mobility/improved transfers.    Time 8    Period Weeks    Status New      PT LONG TERM GOAL #4   Title Pt will undergo gait speed goal when appropriate with goal updated.    Time 8  Period Weeks    Status New      PT LONG TERM GOAL #5   Title Pt will ambulate at least 115' with RW and R hand orthosis with min guard/min A x1 therapist in order to improve functional ambulation.    Time 8    Period Weeks    Status New                    Plan - 07/14/21 1552     Clinical Impression Statement Patient is a 56 year old male referred to Neuro OPPT  s/p L basal ganglia intraperenchymal hemmorrhage on 06/15/21.  Pt's PMH is significant for:HTN. The following deficits were present during the exam: impaired sensation RLE, decr strength, impaired balance, gait abnormalities, impaired proprioception, decr safety awareness, R hemiplegia, impaired postural control. With pt's insurance, pt has a 30 visit limit between all 3 therapy disciplines. Pt would benefit from skilled PT to address these impairments and functional limitations to maximize functional mobility independence, decr fall risk, and decr caregiver burden.    Personal Factors and Comorbidities Comorbidity 1;Past/Current Experience;Time since onset of  injury/illness/exacerbation   visit limit with insurance   Comorbidities HTN    Examination-Activity Limitations Bathing;Locomotion Level;Squat;Stand;Transfers;Hygiene/Grooming;Caring for Others    Examination-Participation Restrictions Driving;Community Activity;Occupation   going to the gym   Stability/Clinical Decision Making Evolving/Moderate complexity    Clinical Decision Making Moderate    Rehab Potential Good    PT Frequency 1x / week    PT Duration 12 weeks    PT Treatment/Interventions ADLs/Self Care Home Management;Aquatic Therapy;Psychologist, educational;Therapeutic activities;Stair training;Gait training;Therapeutic exercise;Balance training;Neuromuscular re-education;Functional mobility training;Orthotic Fit/Training;Patient/family education;Energy conservation;Passive range of motion;Vestibular    PT Next Visit Plan Initial HEP for RLE strengthening and ROM (access Code: [email or web address] from inpatient rehab-update as appropriate). Sit <> stand training with RW. Gait training with +2 using ottobock PLS AFO and blue shoe cover. Weight shifting/NMR to RLE    Consulted and Agree with Plan of Care Patient;Family member/caregiver    Family Member Consulted pt's spouse and best friend             Patient will benefit from skilled therapeutic intervention in order to improve the following deficits and impairments:  Abnormal gait, Decreased activity tolerance, Decreased coordination, Decreased balance, Decreased cognition, Decreased endurance, Decreased knowledge of precautions, Decreased mobility, Decreased range of motion, Decreased safety awareness, Decreased strength, Difficulty walking, Hypomobility, Impaired tone, Impaired sensation  Visit Diagnosis: 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)  Other lack of coordination  Muscle weakness (generalized)  Other symptoms and signs involving the nervous  system  Unsteadiness on feet     Problem List Patient Active Problem List   Diagnosis Date Noted   Expressive aphasia    ICH (intracerebral hemorrhage) (HCC) 06/19/2021   Stroke, hemorrhagic (HCC) 06/15/2021    Drake Leach, PT, DPT  07/14/2021, 3:59 PM  [facility] [address] [place], [place], [address] Phone: [phone]   Fax:  [phone]  Name: Wayne Mendez MRN: [id number] Date of Birth: 01-31-1966

## 2021-07-15 ENCOUNTER — Other Ambulatory Visit: Payer: Self-pay

## 2021-07-15 ENCOUNTER — Ambulatory Visit: Payer: 59

## 2021-07-15 DIAGNOSIS — R4701 Aphasia: Secondary | ICD-10-CM

## 2021-07-15 DIAGNOSIS — R41841 Cognitive communication deficit: Secondary | ICD-10-CM

## 2021-07-15 DIAGNOSIS — I69151 Hemiplegia and hemiparesis following nontraumatic intracerebral hemorrhage affecting right dominant side: Secondary | ICD-10-CM | POA: Diagnosis not present

## 2021-07-15 DIAGNOSIS — R471 Dysarthria and anarthria: Secondary | ICD-10-CM

## 2021-07-15 NOTE — Therapy (Signed)
[facility] [address] [place], [place], [address] Phone: [phone]   Fax:  [phone]  Speech Language Pathology Evaluation  Patient Details  Name: Wayne Mendez MRN: [id number] Date of Birth: 1965-10-30 Referring Provider (SLP): Cathlyn Parsons, PA-C   Encounter Date: 07/15/2021   End of Session - 07/15/21 1114     Visit Number 1    Number of Visits 17   requested 10 visits due to reported insurance limit   Date for SLP Re-Evaluation 10/09/21   written for 12 weeks to account for scheduling   Authorization Type [employer] - 30 visit limit combined; $30 copay    Authorization - Number of Visits 10    SLP Start Time 0932    SLP Stop Time  1018    SLP Time Calculation (min) 46 min    Activity Tolerance Patient tolerated treatment well;Other (comment)   limited by anxiety            Past Medical History:  Diagnosis Date   Hypertension     Past Surgical History:  Procedure Laterality Date   TYMPANOSTOMY TUBE PLACEMENT      There were no vitals filed for this visit.       SLP Evaluation [facility] - 07/15/21 0932       SLP Visit Information   SLP Received On 07/08/21    Referring Provider (SLP) Cathlyn Parsons, PA-C    Onset Date 06-15-21    Medical Diagnosis Nontraumatic subcortical hemorrhage of left cerebral hemisphere      Subjective   Subjective Pt is a 56 year old male that presents to Neuro OPST s/p L basal ganglia intraperenchymal hemmorrhage on 06/15/21. Pt with PMH of HTN (blood pressure 240/110 on admission). Pt accompanied by spouse and best friend. Pt has hard max visit limit of 30 visits combined with ST, OT and PT. Pt lives with family and was independent prior to this episode and working full time.    Patient/Family Stated Goal "I want to be able to get back to work"      Pain Assessment   Currently in Pain? No/denies      General Information   HPI HTN    Behavioral/Cognition  anxious/frustrated    Mobility Status wheelchair - on PT/OT      Balance Screen   Has the patient fallen in the past 6 months No    Has the patient had a decrease in activity level because of a fear of falling?  No    Is the patient reluctant to leave their home because of a fear of falling?  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     Cognition   Overall Cognitive Status Impaired/Different from baseline    Area of Impairment Attention;Memory;Problem solving;Awareness    Current Attention Level Sustained;Selective    Memory Decreased short-term memory    Awareness Intellectual;Emergent;Anticipatory    Problem Solving Slow processing    Behaviors Impulsive;Poor frustration tolerance      Auditory Comprehension   Overall Auditory Comprehension Appears within functional limits for tasks assessed      Expression   Primary Mode of Expression Verbal      Verbal Expression  Overall Verbal Expression Impaired    Initiation No impairment    Level of Generative/Spontaneous Verbalization Conversation    Naming Impairment    Responsive 76-100% accurate    Confrontation 75-100% accurate    Convergent 75-100% accurate    Divergent 50-74% accurate    Verbal Errors Phonemic paraphasias    Interfering Components Attention      Written Expression   Dominant Hand Right   using left   Overall Writen Expression reduced legibility due to use of non-dominant hand      Oral Motor/Sensory Function   Overall Oral Motor/Sensory Function Impaired    Labial Symmetry Abnormal symmetry right    Labial Sensation Reduced    Lingual Coordination Reduced    Facial Symmetry Right droop   mild   Facial Sensation Reduced      Motor Speech   Overall Motor Speech Impaired    Respiration  Within functional limits    Phonation Normal   wife endorsed he gets "throaty" as he gets tired which SLP correlated to back focused phonation   Resonance Within functional limits    Articulation Within functional limitis    Intelligibility Intelligibility reduced   mild   Conversation 75-100% accurate    Motor Planning Witnin functional limits    Effective Techniques Slow rate      Standardized Assessments   Standardized Assessments  Cognitive Linguistic Quick Test   initiated; to be completed in upcoming session                            SLP Education - 07/15/21 1137     Education Details CLQT results, neuropsych recommendation for anxiety/frustration (with wife)    Person(s) Educated Patient;Spouse;Other (comment) friend    Methods Explanation    Comprehension Verbalized understanding;Need further instruction              SLP Short Term Goals - 07/15/21 1131       SLP SHORT TERM GOAL #1   Title Pt will complete CLQT and PROM in first 1-2 ST sessions    Time 4    Period Weeks    Status New      SLP SHORT TERM GOAL #2   Title Pt will recall 3 memory strategies to increase recall of personally-relevant, important information at home given min A verbal cues    Time 4    Period Weeks    Status New      SLP SHORT TERM GOAL #3   Title Pt will recall 3 attention strategies for optimization of focus during conversations to decrease cognitive fatigue and increase recall of important information given min A verbal cues    Time 4    Period Weeks    Status New      SLP SHORT TERM GOAL #4   Title Pt will be 100% intelligible in 10+ minute conversation given occasional min A over 2 sessions    Time 4    Period Weeks    Status New      SLP SHORT TERM GOAL #5   Title Pt will utilize word finding compensations in 10+ minute conversation given occasional min A over 2 sessions    Time 4    Period Weeks    Status New              SLP Long Term Goals  - 07/15/21 1135       SLP LONG TERM GOAL #  1   Title Pt will report successful use of 2+ memory strategies to increase recall of personally-relevant, important information at home.    Time 12    Period Weeks    Status New      SLP LONG TERM GOAL #2   Title Pt will report successful use of 2+ attention strategies for optimization of focus during conversations to increase recall of verbal/written information.    Time 12    Period Weeks    Status New      SLP LONG TERM GOAL #3   Title Pt will maintain 100% intelligibility in 30+ minute mod complex conversation given rare min A    Time 12    Period Weeks    Status New      SLP LONG TERM GOAL #4   Title Pt will effectively compensate for word finding  in 30+ minute mod complex conversation given rare min A    Time 12    Period Weeks    Status New              Plan - 07/15/21 1116     Clinical Impression Statement "Wayne Mendez" was referred for OPST secondary to subcortial hemorrhage on 06-15-21. Today, pt was accompanied by wife, Wayne Mendez, and best friend, Wayne Mendez. Overall significant improvements reported for aphasia and dysarthria in CIR; however, pt still experiences intermittent reduced speech intelligibility and occasional word finding, especially when fatigued or anxious. Example provided for calling his friend the wrong name, in which pt was aware and able to correct almost immediately. Wife and friend indicated change in baseline cognition, including reduced processing speed, retention, and comprehension. SLP initiated Cognitive Linguistic Quick Test (CLQT), in which usual breaks provided due to performance anxiety and frustration. Wife indicated plan to address these behavioral changes with PCP tomorrow. Memory subtest rated mild (borderline moderate) and language subtest rated moderate. While SLP anticipates anxiety and frustration may have contributed to reduced performance, cognitive linguistic skills are clearly not at baseline, which  family and patient were in agreement. Errors exhibited on symbol cancellation task, in which pt able to identify and self-correct majority within time frame. No awareness of inaccurate time exhibited on clock drawing. Generative naming greatly impacted by anxious behaviors, although occasional paraphasias exhibited (ex: leprechaun for leopard). Awareness of paraphasia exhibited but pt did not self-correct. 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 Due to current insurance information provided with 30 visit limit combined, OP therapy team recommended 1x/week for 10 weeks but wife believes this may change so ST POC written to account for 2x/week as pt would benefit and highly motivated to improve current deficits. Skilled ST is warranted to address mild-moderate cognitive lingusitic skills, mild aphasia, and mild dysarthria to optimize return to baseline and increase QOL.    Speech Therapy Frequency 2x / week   requested 1x/week due to insurance visit limitation   Duration 12 weeks   written 12 weeks to account for scheduling   Treatment/Interventions Compensatory strategies;Functional tasks;Patient/family education;Cueing hierarchy;Cognitive reorganization;Multimodal communcation approach;SLP instruction and feedback;Internal/external aids;Compensatory techniques;Language facilitation    Potential to Achieve Goals Good    Consulted and Agree with Plan of Care Patient;Family member/caregiver             Patient will benefit from skilled therapeutic intervention in order to improve the following deficits and impairments:   Cognitive communication deficit  Dysarthria and anarthria  Aphasia    Problem List Patient Active Problem List  Diagnosis Date Noted   Expressive aphasia    ICH (intracerebral hemorrhage) ([place]) 06/19/2021   Stroke, 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

## 2021-07-15 NOTE — Telephone Encounter (Signed)
L/M on her personal voicemail answering questions as above- it's his stroke- it's common to be tired/exhausted from "going" all the time- which he does- he needs time for brain recovery/brain breaks- it will take a few months til this improves- It's not meds; also Neurology would be able to answer questions about stroke clinic- thanks- ML

## 2021-07-16 ENCOUNTER — Ambulatory Visit (INDEPENDENT_AMBULATORY_CARE_PROVIDER_SITE_OTHER): Payer: 59 | Admitting: Family

## 2021-07-16 ENCOUNTER — Encounter: Payer: Self-pay | Admitting: Family

## 2021-07-16 VITALS — BP 135/83 | HR 75 | Temp 97.8°F | Ht 72.0 in

## 2021-07-16 DIAGNOSIS — I10 Essential (primary) hypertension: Secondary | ICD-10-CM | POA: Diagnosis not present

## 2021-07-16 DIAGNOSIS — F418 Other specified anxiety disorders: Secondary | ICD-10-CM | POA: Diagnosis not present

## 2021-07-16 DIAGNOSIS — Z8673 Personal history of transient ischemic attack (TIA), and cerebral infarction without residual deficits: Secondary | ICD-10-CM

## 2021-07-16 DIAGNOSIS — I69951 Hemiplegia and hemiparesis following unspecified cerebrovascular disease affecting right dominant side: Secondary | ICD-10-CM

## 2021-07-16 MED ORDER — HYDROXYZINE HCL 10 MG PO TABS: 10.0000 mg | ORAL_TABLET | Freq: Three times a day (TID) | ORAL | 0 refills | Status: DC | PRN

## 2021-07-16 MED ORDER — BUSPIRONE HCL 5 MG PO TABS: 5.0000 mg | ORAL_TABLET | Freq: Two times a day (BID) | ORAL | 0 refills | Status: DC

## 2021-07-16 NOTE — Patient Instructions (Signed)
Welcome to [facility]! It was a pleasure meeting you today.  As discussed, I am sending a referral for counseling/therapy and Wellbutrin to your pharmacy today, start this tomorrow morning. I am also sending Hydroxyzine to help with any break through spells or just needing to calm your mind down from racing thoughts.  Continue your blood pressure meds and let me know if having any increased readings consistently >130/90.  Please schedule a 1 month follow up visit today.    PLEASE NOTE:  If you had any LAB tests please let us know if you have not heard back within a few days. You may see your results on MyChart before we have a chance to review them but we will give you a call once they are reviewed by [place]. If we ordered any REFERRALS today, please let us know if you have not heard from their office within the next week.  Let us know through MyChart if you are needing REFILLS, or have your pharmacy send [place] the request. You can also use MyChart to communicate with me or any office staff.  Please try these tips to maintain a healthy lifestyle:  Eat most of your calories during the day when you are active. Eliminate processed foods including packaged sweets (pies, cakes, cookies), reduce intake of potatoes, white bread, white pasta, and white rice. Look for whole grain options, oat flour or almond flour.  Each meal should contain half fruits/vegetables, one quarter protein, and one quarter carbs (no bigger than a computer mouse).  Cut down on sweet beverages. This includes juice, soda, and sweet tea. Also watch fruit intake, though this is a healthier sweet option, it still contains natural sugar! Limit to 3 servings daily.  Drink at least 1 glass of water with each meal and aim for at least 8 glasses per day  Exercise at least 150 minutes every week.

## 2021-07-16 NOTE — Progress Notes (Signed)
New Patient Office Visit  Subjective:  Patient ID: Wayne Mendez, male    DOB: 08/25/1965  Age: 56 y.o. MRN: [id number]  CC:  Chief Complaint  Patient presents with   Establish Care   Hypertension   CVA, cerebrovascular accident    06/15/2021   Depression    Wife states that she is concerned that it is getting compulsive.     HPI Wayne Mendez presents for establishing care and to discuss a few problems.  Anxiety/Depression: Patient complains of anxiety disorder.   He has the following symptoms: difficulty concentrating, feelings of losing control, irritable, racing thoughts.  Onset of symptoms was approximately  many  years ago, but worsened with recent health crisis. He denies current suicidal and homicidal ideation.  Possible organic causes contributing are:  recent CVA .  Risk factors: negative life event CVA   Previous treatment includes  no medications and no therapy .   Status post CVA:  first ever stroke on 06/15/2021 causing slurred speech and aphasia (both improving) and right sided hemiparesis. Right arm is flaccid, right leg with minimal movement. Pt has had some rehab and has upcoming outpt rehab appt next week.  Hypertension: Patient is currently maintained on the following medications for blood pressure: Metoprolol, Lisinopril, Amlodipine Failed meds include: none Patient reports good compliance with blood pressure medications. Patient denies chest pain, headaches, shortness of breath or swelling. Last 3 blood pressure readings in our office are as follows: BP Readings from Last 3 Encounters:  07/27/21 124/81  07/23/21 131/88  07/16/21 135/83     Past Medical History:  Diagnosis Date   Hypertension     Past Surgical History:  Procedure Laterality Date   TYMPANOSTOMY TUBE PLACEMENT      History reviewed. No pertinent family history.  Social History   Socioeconomic History   Marital status: Married    Spouse name: Not on file   Number of children:  Not on file   Years of education: Not on file   Highest education level: Not on file  Occupational History   Not on file  Tobacco Use   Smoking status: Never   Smokeless tobacco: Never  Vaping Use   Vaping Use: Never used  Substance and Sexual Activity   Alcohol use: Yes    Comment: rare   Drug use: Not Currently   Sexual activity: Not Currently  Other Topics Concern   Not on file  Social History Narrative   Not on file   Social Determinants of Health   Financial Resource Strain: Not on file  Food Insecurity: Not on file  Transportation Needs: Not on file  Physical Activity: Not on file  Stress: Not on file  Social Connections: Not on file  Intimate Partner Violence: Not on file    Objective:   Today's Vitals: BP 135/83    Pulse 75    Temp 97.8 F (36.6 C) (Temporal)    Ht 6' (1.829 m)    SpO2 99%    BMI 28.94 kg/m   Physical Exam Vitals and nursing note reviewed.  Constitutional:      General: He is not in acute distress.    Appearance: Normal appearance.  HENT:     Head: Normocephalic.  Cardiovascular:     Rate and Rhythm: Normal rate and regular rhythm.  Pulmonary:     Effort: Pulmonary effort is normal.     Breath sounds: Normal breath sounds.  Musculoskeletal:        General:  Normal range of motion.     Cervical back: Normal range of motion.  Skin:    General: Skin is warm and dry.  Neurological:     Mental Status: He is alert and oriented to person, place, and time.     Sensory: Sensory deficit (mild in right arm and leg) present.     Motor: Weakness (right leg) present.     Coordination: Coordination abnormal (right arm and leg).     Gait: Gait abnormal.     Comments: right arm flaccid, right leg with minimal movement, improving sensation  Psychiatric:        Mood and Affect: Mood normal.    Assessment & Plan:   Problem List Items Addressed This Visit       Cardiovascular and Mediastinum   Essential hypertension    NEW - with CVA one  month ago. Started on Lisinopril, Amlodipine and Metoprolol, taking daily & tolerating, BP wnl today.         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        Other   Status post CVA    CVA 1 month ago, d/c from rehab, waiting on NEURO appt. 2/2 - [name]; minimal independent movement of right leg, has sensation, right arm flaccid, numb. 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 Advised pt and family s/p CVA can bring on new mood changes and depression. pt denies depression today, just continued worry. Starting Lexapro and Hydroxyzine prn and q evening to calm his thoughts. Advised on use & SE, f/u in 1 mo.      Relevant Orders   Ambulatory referral to Psychiatry    Outpatient Encounter Medications as of 07/16/2021  Medication Sig   B Complex-C (B-COMPLEX WITH VITAMIN C) tablet Take 1 tablet by mouth daily. (Patient not taking: Reported on 07/23/2021)   [DISCONTINUED] amantadine (SYMMETREL) 100 MG capsule Take 1 capsule (100 mg total) by mouth daily.   [DISCONTINUED] amLODipine (NORVASC) 10 MG tablet Take 1 tablet (10 mg total) by mouth daily.   [DISCONTINUED] atorvastatin (LIPITOR) 10 MG tablet Take 1 tablet (10 mg total) by mouth daily.   [DISCONTINUED] busPIRone (BUSPAR) 5 MG tablet Take 1 tablet (5 mg total) by mouth 2 (two) times daily.   [DISCONTINUED] hydrOXYzine (ATARAX) 10 MG tablet Take 1-2 tablets (10-20 mg total) by mouth 3 (three) times daily as needed. for break through anxiety  and/or in evenings to calm down.   [DISCONTINUED] lisinopril (ZESTRIL) 20 MG tablet Take 1 tablet (20 mg total) by mouth 2 (two) times daily.   [DISCONTINUED] metoprolol tartrate (LOPRESSOR) 100 MG tablet Take 1 tablet (100 mg total) by mouth 2 (two) times daily.   [DISCONTINUED] acetaminophen (TYLENOL) 325 MG tablet Take 2 tablets (650 mg total) by mouth every 4 (four) hours as needed for mild pain (or temp > 37.5 C (99.5 F)).   [DISCONTINUED] pantoprazole (PROTONIX) 40 MG tablet Take  1 tablet (40 mg total) by mouth daily. (Patient not taking: Reported on 07/16/2021)   [DISCONTINUED] senna-docusate (SENOKOT-S) 8.6-50 MG tablet Take 1 tablet by mouth 2 (two) times daily.   [DISCONTINUED] traZODone (DESYREL) 50 MG tablet Take 1 tablet (50 mg total) by mouth at bedtime. (Patient not taking: Reported on 07/16/2021)   No facility-administered encounter medications on file as of 07/16/2021.   *Extra time ( ) spent with patient and wife today which consisted of chart review (ER & neuro notes), discussing diagnoses, work up, treatment, answering questions, and documentation.   Follow-up: Return in about 4 weeks (around 08/13/2021) for post CVA anxiety, blood pressure,.   Dulce Sellar, NP

## 2021-07-17 ENCOUNTER — Other Ambulatory Visit: Payer: Self-pay

## 2021-07-17 ENCOUNTER — Telehealth: Payer: Self-pay

## 2021-07-17 ENCOUNTER — Other Ambulatory Visit: Payer: Self-pay | Admitting: Family

## 2021-07-17 ENCOUNTER — Encounter: Payer: Self-pay | Admitting: Family

## 2021-07-17 DIAGNOSIS — F418 Other specified anxiety disorders: Secondary | ICD-10-CM | POA: Insufficient documentation

## 2021-07-17 DIAGNOSIS — I69951 Hemiplegia and hemiparesis following unspecified cerebrovascular disease affecting right dominant side: Secondary | ICD-10-CM | POA: Insufficient documentation

## 2021-07-17 MED ORDER — ESCITALOPRAM OXALATE 10 MG PO TABS
10.0000 mg | ORAL_TABLET | Freq: Every day | ORAL | 0 refills | Status: DC
  Filled 2021-07-17: qty 30, 30d supply, fill #0

## 2021-07-17 MED ORDER — VENLAFAXINE HCL ER 37.5 MG PO CP24
37.5000 mg | ORAL_CAPSULE | Freq: Every day | ORAL | 0 refills | Status: DC
  Filled 2021-07-17: qty 30, 30d supply, fill #0

## 2021-07-17 NOTE — Assessment & Plan Note (Signed)
CVA 1 month ago, d/c from rehab, waiting on NEURO appt. 2/2 - [name]; minimal independent movement of right leg, has sensation, right arm flaccid, numb. 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

## 2021-07-17 NOTE — Assessment & Plan Note (Signed)
NEURO appt 2/2 - has had PT appt, motivated to get function back, right arm flaccid, right leg minimal movement, getting sensation back.

## 2021-07-17 NOTE — Telephone Encounter (Signed)
Error

## 2021-07-17 NOTE — Telephone Encounter (Signed)
Wayne Mendez-patients wife called stating she had questions for Dr. Dagoberto Ligas. Tried calling Wayne Mendez back no answer, had to leave message.

## 2021-07-17 NOTE — Assessment & Plan Note (Addendum)
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 Advised pt and family s/p CVA can bring on new mood changes and depression. pt denies depression today, just continued worry. Starting Lexapro and Hydroxyzine prn and q evening to calm his thoughts. Advised on use & SE, f/u in 1 mo.

## 2021-07-20 ENCOUNTER — Other Ambulatory Visit (HOSPITAL_COMMUNITY): Payer: Self-pay

## 2021-07-20 ENCOUNTER — Other Ambulatory Visit: Payer: Self-pay | Admitting: Family

## 2021-07-20 DIAGNOSIS — F418 Other specified anxiety disorders: Secondary | ICD-10-CM

## 2021-07-20 MED ORDER — HYDROXYZINE HCL 10 MG PO TABS: 10.0000 mg | ORAL_TABLET | Freq: Three times a day (TID) | ORAL | 0 refills | Status: DC | PRN

## 2021-07-20 MED ORDER — ESCITALOPRAM OXALATE 10 MG PO TABS: 10.0000 mg | ORAL_TABLET | Freq: Every day | ORAL | 0 refills | Status: DC

## 2021-07-21 ENCOUNTER — Ambulatory Visit: Payer: 59 | Attending: Physician Assistant | Admitting: Occupational Therapy

## 2021-07-21 ENCOUNTER — Encounter: Payer: Self-pay | Admitting: Occupational Therapy

## 2021-07-21 ENCOUNTER — Encounter: Payer: Self-pay | Admitting: Speech Pathology

## 2021-07-21 ENCOUNTER — Ambulatory Visit: Payer: 59 | Admitting: Speech Pathology

## 2021-07-21 ENCOUNTER — Telehealth: Payer: Self-pay | Admitting: Adult Health

## 2021-07-21 ENCOUNTER — Other Ambulatory Visit: Payer: Self-pay

## 2021-07-21 DIAGNOSIS — R471 Dysarthria and anarthria: Secondary | ICD-10-CM | POA: Insufficient documentation

## 2021-07-21 DIAGNOSIS — R278 Other lack of coordination: Secondary | ICD-10-CM | POA: Insufficient documentation

## 2021-07-21 DIAGNOSIS — R41840 Attention and concentration deficit: Secondary | ICD-10-CM | POA: Insufficient documentation

## 2021-07-21 DIAGNOSIS — M6281 Muscle weakness (generalized): Secondary | ICD-10-CM | POA: Insufficient documentation

## 2021-07-21 DIAGNOSIS — R4701 Aphasia: Secondary | ICD-10-CM

## 2021-07-21 DIAGNOSIS — I69151 Hemiplegia and hemiparesis following nontraumatic intracerebral hemorrhage affecting right dominant side: Secondary | ICD-10-CM | POA: Diagnosis present

## 2021-07-21 DIAGNOSIS — R41841 Cognitive communication deficit: Secondary | ICD-10-CM

## 2021-07-21 DIAGNOSIS — R2681 Unsteadiness on feet: Secondary | ICD-10-CM | POA: Insufficient documentation

## 2021-07-21 DIAGNOSIS — R29818 Other symptoms and signs involving the nervous system: Secondary | ICD-10-CM | POA: Insufficient documentation

## 2021-07-21 NOTE — Therapy (Signed)
[facility] [address] [place], [place], [address] Phone: [phone]   Fax:  [phone]  Speech Language Pathology Treatment  Patient Details  Name: Wayne Mendez MRN: [id number] Date of Birth: 1965-07-26 Referring Provider (SLP): Charlton Amor, PA-C   Encounter Date: 07/21/2021   End of Session - 07/21/21 1923     Visit Number 2    Number of Visits 17   requested 10 visits due to reported insurance limit   Date for SLP Re-Evaluation 10/09/21   written for 12 weeks to account for scheduling   Authorization Type [employer] - 30 visit limit combined; $30 copay    Authorization - Number of Visits 10    SLP Start Time 1445    SLP Stop Time  1530    SLP Time Calculation (min) 45 min    Activity Tolerance Patient tolerated treatment well   limited by anxiety            Past Medical History:  Diagnosis Date   Hypertension     Past Surgical History:  Procedure Laterality Date   TYMPANOSTOMY TUBE PLACEMENT      There were no vitals filed for this visit.   Subjective Assessment - 07/21/21 1922     Subjective "I just want to get better."    Patient is accompained by: Family member   Wife   Currently in Pain? No/denies                   ADULT SLP TREATMENT - 07/21/21 1940       General Information   Behavior/Cognition Alert;Cooperative      Treatment Provided   Treatment provided Cognitive-Linquistic      Cognitive-Linquistic Treatment   Treatment focused on Patient/family/caregiver education;Cognition    Skilled Treatment Completed CLQT assessment. Pt scores were as follows: Marland Kitchen Edu wife and pt on ST role and discussed anxiety associated with deficits and thinking about the future. To provide pt with cog-comm handout and "relaxation after brain injury" next session.      Assessment / Recommendations / Plan   Plan Continue with current plan of care      Progression Toward Goals   Progression toward  goals Progressing toward goals                SLP Short Term Goals - 07/15/21 1131       SLP SHORT TERM GOAL #1   Title Pt will complete CLQT and PROM in first 1-2 ST sessions    Time 4    Period Weeks    Status New      SLP SHORT TERM GOAL #2   Title Pt will recall 3 memory strategies to increase recall of personally-relevant, important information at home given min A verbal cues    Time 4    Period Weeks    Status New      SLP SHORT TERM GOAL #3   Title Pt will recall 3 attention strategies for optimization of focus during conversations to decrease cognitive fatigue and increase recall of important information given min A verbal cues    Time 4    Period Weeks    Status New      SLP SHORT TERM GOAL #4   Title Pt will be 100% intelligible in 10+ minute conversation given occasional min A over 2 sessions    Time 4    Period Weeks    Status New  SLP SHORT TERM GOAL #5   Title Pt will utilize word finding compensations in 10+ minute conversation given occasional min A over 2 sessions    Time 4    Period Weeks    Status New              SLP Long Term Goals - 07/15/21 1135       SLP LONG TERM GOAL #1   Title Pt will report successful use of 2+ memory strategies to increase recall of personally-relevant, important information at home.    Time 12    Period Weeks    Status New      SLP LONG TERM GOAL #2   Title Pt will report successful use of 2+ attention strategies for optimization of focus during conversations to increase recall of verbal/written information.    Time 12    Period Weeks    Status New      SLP LONG TERM GOAL #3   Title Pt will maintain 100% intelligibility in 30+ minute mod complex conversation given rare min A    Time 12    Period Weeks    Status New      SLP LONG TERM GOAL #4   Title Pt will effectively compensate for word finding  in 30+ minute mod complex conversation given rare min A    Time 12    Period Weeks    Status New                Patient will benefit from skilled therapeutic intervention in order to improve the following deficits and impairments:   Cognitive communication deficit  Aphasia  Dysarthria and anarthria    Problem List Patient Active Problem List   Diagnosis Date Noted   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 [place], [place], [address] Phone: [phone]   Fax:  [phone]   Name: Wayne Mendez MRN: [id number] Date of Birth: 1965/11/09

## 2021-07-21 NOTE — Therapy (Signed)
[facility] [address] [place], [place], [address] Phone: [phone]   Fax:  [phone]  Occupational Therapy Treatment  Patient Details  Name: Wayne Mendez MRN: [id number] Date of Birth: 09-10-1965 Referring Provider (OT): Mariam Dollar PA   Encounter Date: 07/21/2021   OT End of Session - 07/21/21 1537     Visit Number 2    Number of Visits 10    Date for OT Re-Evaluation 10/06/21   10 visits over 12 weeks   Authorization Type [employer]    Authorization Time Period $30 copay, VL: 30 hard max combined (ST, PT, OT)    Authorization - Number of Visits 10    OT Start Time 1530    OT Stop Time 1620    OT Time Calculation (min) 50 min    Activity Tolerance Patient tolerated treatment well    Behavior During Therapy [facility] for tasks assessed/performed;Impulsive            Past Medical History:  Diagnosis Date   Hypertension     Past Surgical History:  Procedure Laterality Date   TYMPANOSTOMY TUBE PLACEMENT      There were no vitals filed for this visit.   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 No/denies             Treatment/Exercises - 07/21/21    Neuromuscular Reeducation  Initiated exercises for NMR of RUE, emphasizing alignment and stability for protection and decreased complications w/ potential UE movement as recovery progresses. Pt completed shoulder shrugs and scapular retraction in front of mirror as a visual aid for activation. Min activation  palpated w/ scapular elevation during shrugs w/ pt encouraged to complete exercises in front of mirror, and fair activation of R side palpated during retraction exercise. Due to pt's report of completing AAROM forward slides, OT modified activity to complete shoulder flexion to chest height w/ hands clasped to decrease potential of pulling R arm forward during sliding exercise. Pt able to complete exercise w/out difficulty after initial cueing for understanding of alignment during stretch; instructed to not pull above chest height. Also facilitated elbow extension in gravity-minimized and gravity-assisted positioned w/ trace activation palpated in both positions. Strong internal rotation bias w/ pertinent education provided to attempt to minimize this and encourage less active muscles to engage. All exercises completed sitting EOM w/ feet on the floor for good trunk alignment and increased awareness and somatosensory input.    Functional Transfers SPV w/ stand-pivot transfer from w/c to sitting EOM and Min A w/ stand-pivot transfer back from EOM to w/c; both transfers completed going toward pt's L side.            OT Education - 07/21/21 1634     Education Details Continued condition-specific education, particularly regarding positioning and support of RUE for decreasing risk of further injury and increased protection. Also introduced education related to palpated and reported spasticity and potential for spasticity management. Initiated HEP  for scapular stability and strengthening w/ corresponding handout provided.    Person(s) Educated Patient;Spouse;Other (comment)   Pt's best friend   Methods Explanation;Demonstration;Handout    Comprehension Verbalized understanding             OT Short Term Goals - 07/21/21 1638       OT SHORT TERM GOAL #1   Title Pt will be independent with initial HEP for RUE stretching and NMR    Time 4    Period Weeks    Status On-going    Target Date 08/11/21       OT SHORT TERM GOAL #2   Title Pt will be verbalize understanding of sesnory strategies for increased safety with sensation impairments with RUE    Time 4    Period Weeks    Status On-going      OT SHORT TERM GOAL #3   Title Pt will report completing all dressing with set up assistance only    Baseline needing assistance for pulling pants up    Time 4    Period Weeks    Status On-going             OT Long Term Goals - 07/21/21 1638       OT LONG TERM GOAL #1   Title Pt will be independent with any updated HEPs    Time 12    Period Weeks    Status On-going    Target Date 10/06/21      OT LONG TERM GOAL #2   Title Pt will be independent with any splint wear and care PRN    Baseline currently pre fab resting hand splint for RUE    Time 12    Period Weeks    Status On-going      OT LONG TERM GOAL #3   Title Pt will report taking self to bathroom and complete toileting with mod I    Time 12    Period Weeks    Status On-going      OT LONG TERM GOAL #4   Title Pt will demonstrate active shoulder flexion of 15 degrees or greater in RUE for progressing towards low level reaching    Baseline trace activation    Time 12    Period Weeks    Status On-going      OT LONG TERM GOAL #5   Title Pt will verbalize understanding of work related tasks and skills and return to work recommendations.    Time 12    Period Weeks    Status On-going             Plan - 07/21/21 1639     Clinical Impression Statement Due to pt transferring from another clinic, OT first reviewed pt-stated goals and facilitated discussion related to what (if any) exercises pt is currently completing at home for his RUE. OT then facilitated NMR of R shoulder, focusing on scapular alignment and stability as well as gentle self-streching that pt is able to complete safely at home. Pt is very motivated to improve and wants to focus on his arm as much as possible; OT provided relevant encouragement and  condition-specific education, answering pt, his wife, and friends' questions as best as able.    OT Occupational Profile and History Problem Focused Assessment - Including review of records relating to presenting problem    Occupational performance deficits (Please refer to evaluation for details): ADL's;IADL's;Work;Leisure;Rest and Sleep    Body Structure / Function /  Physical Skills ADL;Balance;Coordination;ROM;IADL;Proprioception;UE functional use;Sensation;FMC;Strength;GMC;Tone;Decreased knowledge of use of DME    Cognitive Skills Attention;Problem Solve;Safety Awareness;Sequencing    Rehab Potential Good    Clinical Decision Making Limited treatment options, no task modification necessary    Comorbidities Affecting Occupational Performance: None    Modification or Assistance to Complete Evaluation  No modification of tasks or assist necessary to complete eval    OT Frequency 1x / week    OT Duration 12 weeks   10 visits over 12 weeks   OT Treatment/Interventions Self-care/ADL training;Aquatic Therapy;Electrical Stimulation;Ultrasound;Moist Heat;Fluidtherapy;Therapeutic exercise;Neuromuscular education;Manual Therapy;Patient/family education;Functional Mobility Training;Visual/perceptual remediation/compensation;Passive range of motion;Cognitive remediation/compensation;Therapeutic activities;Splinting;DME and/or AE instruction    Plan Self-PROM of ext rot, elbow; attempt RUE weight-bearing through forearm    Consulted and Agree with Plan of Care Patient;Family member/caregiver    Family Member Consulted Bethann Berkshirerisha (spouse)            Patient will benefit from skilled therapeutic intervention in order to improve the following deficits and impairments:   Body Structure / Function / Physical Skills: ADL, Balance, Coordination, ROM, IADL, Proprioception, UE functional use, Sensation, FMC, Strength, GMC, Tone, Decreased knowledge of use of DME Cognitive Skills: Attention, Problem Solve, Safety  Awareness, Sequencing   Visit Diagnosis: 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)  Other lack of coordination  Muscle weakness (generalized)  Attention and concentration deficit  Other symptoms and signs involving the nervous system  Unsteadiness on feet   Problem List Patient Active Problem List   Diagnosis Date Noted   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 [place], [place], [id number] Phone: [phone]   Fax:  [phone]  Name: Wayne DenseKevin Mendez MRN: [id number] Date of Birth: 12/24/1965

## 2021-07-21 NOTE — Telephone Encounter (Signed)
Referrals called patient/wife to schedule initial hospital follow-up visit regarding recent stroke. Apparently wife had multiple questions and concerns re: weird sensation on right side of head which had been present since his stroke, limited therapy sessions (wanted these extended), and other questions that were not further discussed with referrals. Patient has initial hospital follow up scheduled 2/20. I did not personally seen him the the hospital. He has since had f/u with PCP and is scheduled to see PMR 2/2. It was encouraged for him to discuss these concerns during that visit and per review of epic, looks like [name] planned to do so (per MyChart messages with wife). [name] did address "weird head sensation" and as this was not new or worsening, felt okay to discuss at follow up visit which is completely reasonable. Per wife, both PMR and PCP advised wife to address these issues with our office but unfortunately, as I have not yet seen patient in office, it is difficult to fully address. Again, per [name], plan to address these concerns at follow up visit on 2/2 which is appropriate. If he should experience any new or worsening symptoms, they should proceed to ED immediately for emergent evaluation. We will plan on seeing him 2/20 which is still appropriate at this time.

## 2021-07-21 NOTE — Telephone Encounter (Signed)
LVM requesting call back.

## 2021-07-22 ENCOUNTER — Encounter: Payer: Self-pay | Admitting: Physical Therapy

## 2021-07-22 ENCOUNTER — Ambulatory Visit: Payer: 59 | Attending: Physician Assistant | Admitting: Physical Therapy

## 2021-07-22 DIAGNOSIS — R29818 Other symptoms and signs involving the nervous system: Secondary | ICD-10-CM | POA: Insufficient documentation

## 2021-07-22 DIAGNOSIS — R41840 Attention and concentration deficit: Secondary | ICD-10-CM | POA: Diagnosis present

## 2021-07-22 DIAGNOSIS — I69151 Hemiplegia and hemiparesis following nontraumatic intracerebral hemorrhage affecting right dominant side: Secondary | ICD-10-CM | POA: Diagnosis present

## 2021-07-22 DIAGNOSIS — R278 Other lack of coordination: Secondary | ICD-10-CM | POA: Insufficient documentation

## 2021-07-22 DIAGNOSIS — R2681 Unsteadiness on feet: Secondary | ICD-10-CM | POA: Diagnosis present

## 2021-07-22 DIAGNOSIS — R208 Other disturbances of skin sensation: Secondary | ICD-10-CM | POA: Insufficient documentation

## 2021-07-22 DIAGNOSIS — R41841 Cognitive communication deficit: Secondary | ICD-10-CM | POA: Diagnosis present

## 2021-07-22 DIAGNOSIS — M6281 Muscle weakness (generalized): Secondary | ICD-10-CM | POA: Insufficient documentation

## 2021-07-22 NOTE — Therapy (Signed)
[facility] [address] [place], [place], [address] Phone: [phone]   Fax:  [phone]  Physical Therapy Treatment  Patient Details  Name: Wayne Mendez MRN: [id number] Date of Birth: 07/30/1965 Referring Provider (PT): Charlton Amor, PA-C   Encounter Date: 07/22/2021   PT End of Session - 07/22/21 1806     Visit Number 2    Number of Visits 11    Date for PT Re-Evaluation 10/12/21    Authorization Type [employer] - $30 copay, VL: 30 hard max combined (ST, PT, OT)    PT Start Time 1446    PT Stop Time 1534    PT Time Calculation (min) 48 min    Equipment Utilized During Treatment Gait belt    Activity Tolerance Patient tolerated treatment well    Behavior During Therapy WFL for tasks assessed/performed             Past Medical History:  Diagnosis Date   Hypertension     Past Surgical History:  Procedure Laterality Date   TYMPANOSTOMY TUBE PLACEMENT      There were no vitals filed for this visit.   Subjective Assessment - 07/22/21 1758     Subjective Very motivated, starting to have more R UE/LE pain and itching; able to wiggle toes this morning which is new! Haven't been walking at home as CIR told me not to without PT there, but having been working on squats and midline in mirror    Patient is accompained by: Family member   spouse and best friend   Patient Stated Goals Wants to get out of his w/c, wants to be like he was before.    Currently in Pain? No/denies                               [facility] Adult PT Treatment/Exercise - 07/22/21 0001       Bed Mobility   Rolling Right Supervision/verbal cueing    Rolling Left Supervision/Verbal cueing    Supine to Sit Supervision/Verbal cueing    Sit to Supine Supervision/Verbal cueing      Transfers   Sit to Stand 4: Min assist    Stand to Sit 4: Min assist      Ambulation/Gait   Gait Comments on first attempt with HHA on L/second person  assisting with placement and control of R LE x5 ft; progressed to 79ft with hemiwalker with 1 person assist for RLE management, therband mock AFO, and second person guarding on L side for safety      Exercises   Exercises Knee/Hip      Knee/Hip Exercises: Standing   Other Standing Knee Exercises cross midline reaches in standing with multimodal facilitationfor weight shift onto R LE nad balance, RLE alignment    Other Standing Knee Exercises multiple sit to stands throughout session with min guard/occasional MinA for balance, cues for weight midline instead of over L LE      Knee/Hip Exercises: Seated   Other Seated Knee/Hip Exercises WB on R elbow with up to ModA from PT to guide and for quality reps x6    Other Seated Knee/Hip Exercises cross midline reaches for cones for incresaed WB R LE/more core activation x8      Knee/Hip Exercises: Supine   Bridges Both;1 set;10 reps    Bridges Limitations MinA for positioning of R LE    Straight Leg Raises Right;1 set;10  reps    Straight Leg Raises Limitations ModA for motor control/descent    Other Supine Knee/Hip Exercises supine hip extension pushing therapist body weight x2    Other Supine Knee/Hip Exercises supine hip ABD x5 with PT just preventing ER                     PT Education - 07/22/21 1805     Education Details POC moving forward, improvements already since PT eval, resources available in community to continue PT after visits are used; role of PT in managing tone; implications of more nerve based sensations in terms of things progressing after the CVA    Person(s) Educated Patient;Spouse;Other (comment)   best friend   Methods Explanation    Comprehension Verbalized understanding              PT Short Term Goals - 07/14/21 1542       PT SHORT TERM GOAL #1   Title Pt will be independent with initial HEP with family supervision/assist in order to build upon functional gains made in therapy. ALL STGS DUE 08/11/21     Time 4    Period Weeks    Status New    Target Date 08/11/21      PT SHORT TERM GOAL #2   Title Pt will perform squat pivot transfer from w/c <> mat table with supervision in improve independence with transfers.    Baseline min guard    Time 4    Period Weeks    Status New      PT SHORT TERM GOAL #3   Title Pt will perform sit <> stands with RW with min guard in order to demo improved functional transfers and balance.    Baseline Min A    Time 4    Period Weeks    Status New      PT SHORT TERM GOAL #4   Title Pt will ambulate at least 5550' with RW and R hand orthosis with min A x1 therapist in order to improve functional ambulation.    Time 4    Period Weeks    Status New               PT Long Term Goals - 07/14/21 1545       PT LONG TERM GOAL #1   Title Pt will be independent with final HEP with family supervision/assist in order to build upon functional gains made in therapy. ALL LTGS DUE 09/08/21    Time 8    Period Weeks    Status New    Target Date 09/08/21      PT LONG TERM GOAL #2   Title Pt will perform stand pivot transfer from w/c <> mat table with RW and supervision in improve independence with transfers.    Baseline squat pivot with CGA    Time 8    Period Weeks    Status New      PT LONG TERM GOAL #3   Title Pt will perform sit <> stands with RW and supervision in order to demo improved functional mobility/improved transfers.    Time 8    Period Weeks    Status New      PT LONG TERM GOAL #4   Title Pt will undergo gait speed goal when appropriate with goal updated.    Time 8    Period Weeks    Status New      PT LONG TERM  GOAL #5   Title Pt will ambulate at least 115' with RW and R hand orthosis with min guard/min A x1 therapist in order to improve functional ambulation.    Time 8    Period Weeks    Status New                   Plan - 07/22/21 1807     Clinical Impression Statement Wayne Mendez arrives today with his wife and best  friend; hes really motivated and seems to have really great support. Definitely has some extensor tone in RLE causing knee hyperextension and ankle plantar flexion/inversion during swing phase of gait but MUCH more steady today. Able to actively flex/abduct/extend R LE today in supine and standing, unable to march with this leg just yet. Able to gait train with hemiwalker with close WC follow and second person for safety, Min-ModA for R LE motor control and placement. Does for sure have extensor tone, as well as tendency to plantar flex and invert R ankle during swing, also adducts R LE during swing and needs help with RLE placement to prevent rolling ankle when transitioning to stance phase. Also needs more practice with lateral weight shifts for gait.  I do think we need to start talking about getting him a custom AFO- improving rapidly and could really benefit. I think we will definitely be able to make decent progress with him over the next few weeks.    Personal Factors and Comorbidities Comorbidity 1;Past/Current Experience;Time since onset of injury/illness/exacerbation    Comorbidities HTN    Examination-Activity Limitations Bathing;Locomotion Level;Squat;Stand;Transfers;Hygiene/Grooming;Caring for Others    Examination-Participation Restrictions Driving;Community Activity;Occupation    Stability/Clinical Decision Making Evolving/Moderate complexity    Clinical Decision Making Moderate    Rehab Potential Good    PT Frequency 2x / week    PT Duration 4 weeks    PT Treatment/Interventions ADLs/Self Care Home Management;Aquatic Therapy;Psychologist, educational;Therapeutic activities;Stair training;Gait training;Therapeutic exercise;Balance training;Neuromuscular re-education;Functional mobility training;Orthotic Fit/Training;Patient/family education;Energy conservation;Passive range of motion;Vestibular    PT Next Visit Plan HEP; pregait activities, weight shifting activities; try toe  foam from [employer] to see if it helps inversion with swing?    Consulted and Agree with Plan of Care Patient;Family member/caregiver    Family Member Consulted pt's spouse and best friend             Patient will benefit from skilled therapeutic intervention in order to improve the following deficits and impairments:  Abnormal gait, Decreased activity tolerance, Decreased coordination, Decreased balance, Decreased cognition, Decreased endurance, Decreased knowledge of precautions, Decreased mobility, Decreased range of motion, Decreased safety awareness, Decreased strength, Difficulty walking, Hypomobility, Impaired tone, Impaired sensation  Visit Diagnosis: Muscle weakness (generalized)  Other lack of coordination  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 [place], [place], [address] Phone: [phone]   Fax:  [phone]  Name: Wayne Mendez MRN: [id number] Date of Birth:  09/16/1965 ° ° ° °

## 2021-07-23 ENCOUNTER — Encounter: Payer: 59 | Attending: Registered Nurse | Admitting: Registered Nurse

## 2021-07-23 ENCOUNTER — Telehealth: Payer: Self-pay | Admitting: Physical Medicine and Rehabilitation

## 2021-07-23 ENCOUNTER — Other Ambulatory Visit: Payer: Self-pay

## 2021-07-23 ENCOUNTER — Encounter: Payer: Self-pay | Admitting: *Deleted

## 2021-07-23 ENCOUNTER — Encounter: Payer: Self-pay | Admitting: Registered Nurse

## 2021-07-23 VITALS — BP 131/88 | HR 72 | Temp 98.6°F | Ht 72.0 in

## 2021-07-23 DIAGNOSIS — R4701 Aphasia: Secondary | ICD-10-CM | POA: Insufficient documentation

## 2021-07-23 DIAGNOSIS — I69151 Hemiplegia and hemiparesis following nontraumatic intracerebral hemorrhage affecting right dominant side: Secondary | ICD-10-CM | POA: Diagnosis present

## 2021-07-23 DIAGNOSIS — Z8673 Personal history of transient ischemic attack (TIA), and cerebral infarction without residual deficits: Secondary | ICD-10-CM | POA: Diagnosis present

## 2021-07-23 DIAGNOSIS — G8191 Hemiplegia, unspecified affecting right dominant side: Secondary | ICD-10-CM | POA: Diagnosis not present

## 2021-07-23 DIAGNOSIS — I10 Essential (primary) hypertension: Secondary | ICD-10-CM | POA: Diagnosis not present

## 2021-07-23 NOTE — Telephone Encounter (Signed)
Sent my chart with NP's message.

## 2021-07-23 NOTE — Progress Notes (Signed)
Subjective:    Patient ID: Wayne Mendez, male    DOB: December 24, 1965, 56 y.o.   MRN: [id number]  HPI: Wayne Mendez is a 56 y.o. male who is here for Transitional Care Visit for follow up of his  Intracerebral Hemorrhage (ICH), Right Hemiparesis  and Essential Hypertension. He presented to [facility] on 06/15/2021 via EMS  with acute onset of right side weakness and slurred speech. H&P: On 05/26/2021: Wayne. Thomasena Edis:    CC: right sided weakness and slurred speech   History is obtained from: EMS and family   HPI: Wayne Mendez is a 56 y.o. male PMHx HTN not taking medications was walking into the gym and suddenly developed right sided weakness and slurred speech.   EMS vitals on the scene: BP 240/110, 94%  CT Head WO Contrast:  IMPRESSION: 1. Intraparenchymal hemorrhage in the left basal ganglia and external capsule region, probably a hemorrhagic infarction. Focal hematoma with estimated volume of 23 cc. Mild surrounding edema and mass effect. No intraventricular or subarachnoid penetration. 2. Mild small vessel ischemic changes of the right hemispheric white matter and thalamus.  CTA:  IMPRESSION: No intracranial large vessel occlusion or stenosis. No aneurysm or vascular malformation.   No significant carotid bifurcation disease. Posterior circulation widely patent.   Few minimal patchy infiltrates suspected in the right lung which could go along with mild patchy pneumonia. There is considerable motion degradation on the limited upper chest images.   MR Brain W.WO Contrast:  IMPRESSION: 1. Grossly unchanged intraparenchymal hemorrhage centered in the left basal ganglia, extending into the left frontal and temporal lobes, with mild surrounding edema and local mass effect, causing approximately 3 mm of left-to-right midline shift, unchanged. 2. Redemonstrated diffuse sinus mucosal thickening with air-fluid level in the right maxillary sinus. 3. No additional acute intracranial  process.   Neurology following.  Wayne Mendez was admitted to inpatient rehabilitation on 06/19/2021 and discharged home on 07/10/2021. He is receiving outpatient therapy at [facility]. He denies any pain at this time. He rates his pain 0.   Wayne Mendez had several questions today, all questions were answered and this provider was in contact with Wayne Mendez, with Mr. And Mrs. Donahue questions. Wayne Mendez answered all of their  questions. Mr. [name] doesn't want to take baclofen until he sees Wayne Mendez. He will be scheduled with Wayne Mendez on 07/27/2021. They verbalize understanding.   Pain Inventory Average Pain 7 Pain Right Now 0 My pain is  only one time on yesterday after physical therapy  LOCATION OF PAIN  All in right side   BOWEL Number of stools per week: 5-7   BLADDER Normal    Mobility ability to climb steps?  no do you drive?  no use a wheelchair transfers alone Do you have any goals in this area?  yes  Function disabled: date disabled 06/15/2021 I need assistance with the following:  dressing, bathing, toileting, meal prep, household duties, and shopping Do you have any goals in this area?  yes  Neuro/Psych anxiety  Prior Studies Any changes since last visit?  no  Physicians involved in your care Any changes since last visit?  no   History reviewed. No pertinent family history. Social History   Socioeconomic History   Marital status: Married    Spouse name: Not on file   Number of children: Not on file   Years of education: Not on file   Highest education level: Not on file  Occupational History  Not on file  Tobacco Use   Smoking status: Never   Smokeless tobacco: Never  Vaping Use   Vaping Use: Never used  Substance and Sexual Activity   Alcohol use: Yes    Comment: rare   Drug use: Not Currently   Sexual activity: Not Currently  Other Topics Concern   Not on file  Social History Narrative   Not on file   Social  Determinants of Health   Financial Resource Strain: Not on file  Food Insecurity: Not on file  Transportation Needs: Not on file  Physical Activity: Not on file  Stress: Not on file  Social Connections: Not on file   Past Surgical History:  Procedure Laterality Date   TYMPANOSTOMY TUBE PLACEMENT     Past Medical History:  Diagnosis Date   Hypertension    BP (!) 142/83    Pulse 72    Temp 98.6 F (37 C)    Ht 6' (1.829 m)    SpO2 98%    BMI 28.94 kg/m   Opioid Risk Score:   Fall Risk Score:  `1  Depression screen PHQ 2/9  Depression screen PHQ 2/9 07/23/2021  Decreased Interest 0  Down, Depressed, Hopeless 0  PHQ - 2 Score 0  Altered sleeping 0  Tired, decreased energy 0  Change in appetite 0  Feeling bad or failure about yourself  0  Trouble concentrating 0  Moving slowly or fidgety/restless 0  Suicidal thoughts 0  PHQ-9 Score 0    Review of Systems  Constitutional:  Positive for unexpected weight change.       Weight loss  Neurological:  Positive for speech difficulty, weakness and numbness.       Cognition, tingling  Psychiatric/Behavioral:         Anxiety - worry  All other systems reviewed and are negative.     Objective:   Physical Exam Vitals and nursing note reviewed.  Constitutional:      Appearance: Normal appearance.  Cardiovascular:     Rate and Rhythm: Normal rate and regular rhythm.     Pulses: Normal pulses.     Heart sounds: Normal heart sounds.  Pulmonary:     Effort: Pulmonary effort is normal.     Breath sounds: Normal breath sounds.  Musculoskeletal:     Cervical back: Normal range of motion and neck supple.     Comments: Normal Muscle Bulk and Muscle Testing Reveals:  Upper Extremities: Right: Upper Extremity: Paralysis Left: Upper Extremity: Full ROM and Muscle Strength 5/5 Lower Extremities: Right: Paralysis Left Lower Extremity: Full ROM and Muscle Strength 5/5 Arrived in wheelchair    Skin:    General: Skin is warm and dry.   Neurological:     Mental Status: He is alert and oriented to person, place, and time.  Psychiatric:        Mood and Affect: Mood normal.        Behavior: Behavior normal.         Assessment & Plan:  1.Intracerebral Hemorrhage (ICH): Right Hemiparesis:  Continue Outpatient Therapy at [facility]: He has a scheduled appointment with Neurology.  2. Essential Hypertension: Continue current medication regimen. PCP Following. Continue to Monitor.   F/U with Wayne Mendez

## 2021-07-27 ENCOUNTER — Encounter: Payer: Self-pay | Admitting: Physical Medicine and Rehabilitation

## 2021-07-27 ENCOUNTER — Encounter (HOSPITAL_BASED_OUTPATIENT_CLINIC_OR_DEPARTMENT_OTHER): Payer: 59 | Admitting: Physical Medicine and Rehabilitation

## 2021-07-27 ENCOUNTER — Other Ambulatory Visit: Payer: Self-pay

## 2021-07-27 VITALS — BP 124/81 | HR 60 | Ht 72.0 in

## 2021-07-27 DIAGNOSIS — I10 Essential (primary) hypertension: Secondary | ICD-10-CM | POA: Diagnosis not present

## 2021-07-27 DIAGNOSIS — Z8673 Personal history of transient ischemic attack (TIA), and cerebral infarction without residual deficits: Secondary | ICD-10-CM | POA: Diagnosis not present

## 2021-07-27 DIAGNOSIS — R4701 Aphasia: Secondary | ICD-10-CM | POA: Diagnosis not present

## 2021-07-27 DIAGNOSIS — I69151 Hemiplegia and hemiparesis following nontraumatic intracerebral hemorrhage affecting right dominant side: Secondary | ICD-10-CM

## 2021-07-27 MED ORDER — METOPROLOL TARTRATE 50 MG PO TABS: 50.0000 mg | ORAL_TABLET | Freq: Two times a day (BID) | ORAL | 5 refills | Status: DC

## 2021-07-27 MED ORDER — BACLOFEN 5 MG PO TABS: 5.0000 mg | ORAL_TABLET | Freq: Three times a day (TID) | ORAL | 2 refills | Status: DC | PRN

## 2021-07-27 MED ORDER — AMANTADINE HCL 100 MG PO CAPS: 100.0000 mg | ORAL_CAPSULE | Freq: Every day | ORAL | 5 refills | Status: DC

## 2021-07-27 NOTE — Patient Instructions (Signed)
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    Pt meets criteria for short term disability- max 13 weeks- and then will most likely need long term disability after that- giving him maximal amount of time to improve his functional gains/return to work.   2.  Decrease Metoprolol to 50 mg 2x/day- for low Heart rate and lower/soft BP- sweet spot is 70s for Heart rate- and want his BP running 110s/120s on average for Systolic/upper number.    3. Continue Norvasc/amlodipine and lisinopril at current doses. PCP writing for them.   4.  Continue Amantadine 100 mg daily- for a total of 3 months then try to stop- IF cannot due to decrease in energy OR decrease in word finding abilities, then can restart. Might need another 6 months.   5. Will try Baclofen 5 mg 3x/day as needed for spasticity.  Spasticity is usually worse early in AM or after 6pm- and take esp if OT/PT feels it's interfering in therapy.   6. Not at an appropriate spasticity level to meet criteria for Botox quite yet.   7. PCP can take over Metoprolol dose in future as long as Heart rate and BP doing OK.    8. Asking about Bioness- ask OT outpatient about Bioness.  Have to have a response to Estim for it to work. Try in therapy.   9. F/U 4-6 weeks- already has one scheduled.

## 2021-07-27 NOTE — Telephone Encounter (Signed)
Added appointment to see pt on 07/27/21 to answer all the questions- thanks- ML

## 2021-07-27 NOTE — Progress Notes (Signed)
Subjective:    Patient ID: Wayne Mendez, male    DOB: 1966/03/31, 56 y.o.   MRN: [id number]  HPI Pt is a 56 yr old male with R hemiplegia and aphasia due to L basal ganglia ICH from uncontrolled HTN in 1/23 Here for f/u on stroke and weakness.    HR is running low 60s WITH Metoprolol. BP running 100-110 systolic.   Doing outpatient- PT, OT and SLP.   Doing some H/H SLP private pay.   Needs short term disability paperwork- For [employer].     Pain Inventory Average Pain 0 Pain Right Now 0 My pain is  No pain  LOCATION OF PAIN  No pain  BOWEL Number of stools per week: 6   BLADDER Normal    Mobility ability to climb steps?  no do you drive?  no use a wheelchair transfers alone Do you have any goals in this area?  yes  Function employed # of hrs/week 40 what is your job? [employer]  Neuro/Psych No problems in this area  Prior Studies Any changes since last visit?  no  Physicians involved in your care Any changes since last visit?  no   History reviewed. No pertinent family history. Social History   Socioeconomic History   Marital status: Married    Spouse name: Not on file   Number of children: Not on file   Years of education: Not on file   Highest education level: Not on file  Occupational History   Not on file  Tobacco Use   Smoking status: Never   Smokeless tobacco: Never  Vaping Use   Vaping Use: Never used  Substance and Sexual Activity   Alcohol use: Yes    Comment: rare   Drug use: Not Currently   Sexual activity: Not Currently  Other Topics Concern   Not on file  Social History Narrative   Not on file   Social Determinants of Health   Financial Resource Strain: Not on file  Food Insecurity: Not on file  Transportation Needs: Not on file  Physical Activity: Not on file  Stress: Not on file  Social Connections: Not on file   Past Surgical History:  Procedure Laterality Date   TYMPANOSTOMY TUBE PLACEMENT     Past Medical History:   Diagnosis Date   Hypertension    BP 124/81    Pulse 60    Ht 6' (1.829 m)    SpO2 98%    BMI 28.94 kg/m   Opioid Risk Score:   Fall Risk Score:  `1  Depression screen PHQ 2/9  Depression screen PHQ 2/9 07/23/2021  Decreased Interest 0  Down, Depressed, Hopeless 0  PHQ - 2 Score 0  Altered sleeping 0  Tired, decreased energy 0  Change in appetite 0  Feeling bad or failure about yourself  0  Trouble concentrating 0  Moving slowly or fidgety/restless 0  Suicidal thoughts 0  PHQ-9 Score 0     Review of Systems  Constitutional: Negative.   HENT: Negative.    Eyes: Negative.   Respiratory: Negative.    Cardiovascular: Negative.   Gastrointestinal: Negative.   Endocrine: Negative.   Genitourinary: Negative.   Musculoskeletal:  Positive for gait problem.  Skin: Negative.   Allergic/Immunologic: Negative.   Hematological: Negative.   Psychiatric/Behavioral: Negative.        Objective:   Physical Exam Awake, alert, appropriate, in manual w/c; accompanied by BFF, Wayne Mendez, NAD  Neuro: MAS of 2 in R shoulder (  is sore) ,however R elbow and R wrist are MAS of 1 to 1+ Fingers Mas of 0-1 Hoffman's (+) RUE   MS: RUE- Biceps 2-/5; and Triceps trace to 2-/5; otherwise 0/5 distally No subluxation of R shoulder RLE: HF 2/5; KE 3+/5 when foot taken off foot pad; DF 1/5; and PF 2+/5        Assessment & Plan:   Pt is a 56 yr old male with R hemiplegia and aphasia due to L basal ganglia ICH from uncontrolled HTN in 1/23 Here for f/u on stroke and weakness.    Pt meets criteria for short term disability- max 13 weeks- and then will most likely need long term disability after that- giving him maximal amount of time to improve his functional gains/return to work.   2.  Decrease Metoprolol to 50 mg 2x/day- for low Heart rate and lower/soft BP- sweet spot is 70s for Heart rate- and want his BP running 110s/120s on average for Systolic/upper number.    3. Continue  Norvasc/amlodipine and lisinopril at current doses. PCP writing for them.   4.  Continue Amantadine 100 mg daily- for a total of 3 months then try to stop- IF cannot due to decrease in energy OR decrease in word finding abilities, then can restart. Might need another 6 months.   5. Will try Baclofen 5 mg 3x/day as needed for spasticity.  Spasticity is usually worse early in AM or after 6pm- and take esp if OT/PT feels it's interfering in therapy.   6. Not at an appropriate spasticity level to meet criteria for Botox quite yet.   7. PCP can take over Metoprolol dose in future as long as Heart rate and BP doing OK.    8. Asking about Bioness- ask OT outpatient about Bioness.  Have to have a response to Estim for it to work. Try in therapy.   9. F/U 4-6 weeks- already has one scheduled.    I spent  32   minutes on appointment today; more than 50% of that time was spent on educating patient as per details/plan above.  Discussed spasticity and recovery prognosis

## 2021-07-28 ENCOUNTER — Ambulatory Visit: Payer: 59 | Admitting: Occupational Therapy

## 2021-07-28 DIAGNOSIS — R41840 Attention and concentration deficit: Secondary | ICD-10-CM

## 2021-07-28 DIAGNOSIS — R278 Other lack of coordination: Secondary | ICD-10-CM

## 2021-07-28 DIAGNOSIS — M6281 Muscle weakness (generalized): Secondary | ICD-10-CM | POA: Diagnosis not present

## 2021-07-28 DIAGNOSIS — R29818 Other symptoms and signs involving the nervous system: Secondary | ICD-10-CM

## 2021-07-28 DIAGNOSIS — I69151 Hemiplegia and hemiparesis following nontraumatic intracerebral hemorrhage affecting right dominant side: Secondary | ICD-10-CM

## 2021-07-28 DIAGNOSIS — R208 Other disturbances of skin sensation: Secondary | ICD-10-CM

## 2021-07-29 ENCOUNTER — Telehealth: Payer: Self-pay | Admitting: Physical Medicine and Rehabilitation

## 2021-07-29 NOTE — Telephone Encounter (Signed)
Spoke with wife to let her know form was ready to be picked up, and that it was a $25 charge, and she was told by Zella Ball that we would take care of the form for her.  I told her we will send it this time, but in future that there would be a $25 charge for forms.  I have faxed it to [phone] and sent the original to patient with proof of fax to home address.

## 2021-07-29 NOTE — Therapy (Signed)
[place]. [place], [place], [address] Phone: [phone]   Fax:  [phone]  Occupational Therapy Treatment  Patient Details  Name: Wayne Mendez MRN: [id number] Date of Birth: [birth year] Referring Provider (OT): Lauraine Rinne PA   Encounter Date: 07/28/2021   OT End of Session - 07/28/21 1505     Visit Number 3    Number of Visits 10    Date for OT Re-Evaluation 10/06/21   10 visits over 12 weeks   Authorization Type UHC    Authorization Time Period $30 copay, VL: 30 hard max combined (ST, PT, OT)    Authorization - Number of Visits 10    OT Start Time 1400    OT Stop Time 1445    OT Time Calculation (min) 45 min    Activity Tolerance Patient tolerated treatment well    Behavior During Therapy WFL for tasks assessed/performed            Past Medical History:  Diagnosis Date   Hypertension     Past Surgical History:  Procedure Laterality Date   TYMPANOSTOMY TUBE PLACEMENT      There were no vitals filed for this visit.   Subjective Assessment - 07/28/21 1504     Subjective  Pt's friend asked if OT had heard of the [facility] through [facility]    Patient is accompanied by: Family member   spouse and best friend   Pertinent History L basal ganglia intraperenchymal hemorrhage on 06/15/21; HTN    Limitations Fall risk, impulsive    Patient Stated Goals walk, get arm working    Currently in Pain? Other (Comment)   pt reports he has been having more neuropathic type pain recently, but none right now            OT Education - 07/28/21 1504     Education Details Continued CVA-specific education, particularly regarding neuro reed and typical recovery patterns, approaches to therapy (i.e., restoration, compensation and modification) across the phases of recovery, benefit of somatosensory input w/ hemiparesis, and positioning/support of RUE for decreasing risk of further injury and increased  protection    Person(s) Educated Patient;Other (comment)   Pt's best friend   Methods Explanation;Demonstration    Comprehension Verbalized understanding             Treatment/Exercises - 07/28/21    Neuromuscular Reeducation Session focused on NMR of RUE w/ OT emphasizing importance of focusing on muscle activation w/out compensatory movements vs increased ROM. Completed single-arm shoulder shrugs/scapular elevation on R side only w/ tactile cue at chest to decrease compensation; fair activation observed inconsistently w/ mirror in front of pt as visual aid. Pt then transferred from w/c to EOM at [facility] toward L side. OT facilitated AROM of elbow flexion/ext, again focusing on activation w/out compensation vs attempting to force larger movements; elbow flex/ext 2+/5. Strong R shoulder internal rotation, likely due to compensation, and trace external rotation in gravity-minimized position on tabletop. OT then attempted weight-bearing through R elbow/forearm while sitting EOM; significant support provided at R shoulder w/ OT guiding pt through slightly decreasing LUE support for increased wt through RUE, as well as attempting to push through RUE as if about to push to return to sitting; good isometric activation palpated at R shoulder. UE ranger used at approx 60 degrees to facilitate R shoulder flexion, again emphasizing activation vs arc of motion; fair shoulder flexion observed/palpated.  OT Short Term Goals - 07/21/21 1638       OT SHORT TERM GOAL #1   Title Pt will be independent with initial HEP for RUE stretching and NMR    Time 4    Period Weeks    Status On-going    Target Date 08/11/21      OT SHORT TERM GOAL #2   Title Pt will be verbalize understanding of sesnory strategies for increased safety with sensation impairments with RUE    Time 4    Period Weeks    Status On-going      OT SHORT TERM GOAL #3   Title Pt will report completing all dressing with set up  assistance only    Baseline needing assistance for pulling pants up    Time 4    Period Weeks    Status On-going             OT Long Term Goals - 07/21/21 1638       OT LONG TERM GOAL #1   Title Pt will be independent with any updated HEPs    Time 12    Period Weeks    Status On-going    Target Date 10/06/21      OT LONG TERM GOAL #2   Title Pt will be independent with any splint wear and care PRN    Baseline currently pre fab resting hand splint for RUE    Time 12    Period Weeks    Status On-going      OT LONG TERM GOAL #3   Title Pt will report taking self to bathroom and complete toileting with mod I    Time 12    Period Weeks    Status On-going      OT LONG TERM GOAL #4   Title Pt will demonstrate active shoulder flexion of 15 degrees or greater in RUE for progressing towards low level reaching    Baseline trace activation    Time 12    Period Weeks    Status On-going      OT LONG TERM GOAL #5   Title Pt will verbalize understanding of work related tasks and skills and return to work recommendations.    Time 12    Period Weeks    Status On-going             Plan - 07/28/21 1506     Clinical Impression Statement OT continued to facilitate education and provide updated exercises to address RUE NMR. Focusing on activation of specific muscles/muscle groups vs trying to force a larger movement and ending up compensating w/ other movement patterns. Fair shoulder flexion, elbow flexion, and good shoulder internal rotation observed this session, w/ shoulder ext rotation, abduction, and elbow extension more limited. Most success w/ gravity eliminated or minimized and additional support under RUE to offset working against weight of the arm.    OT Occupational Profile and History Problem Focused Assessment - Including review of records relating to presenting problem    Occupational performance deficits (Please refer to evaluation for details):  ADL's;IADL's;Work;Leisure;Rest and Sleep    Body Structure / Function / Physical Skills ADL;Balance;Coordination;ROM;IADL;Proprioception;UE functional use;Sensation;FMC;Strength;GMC;Tone;Decreased knowledge of use of DME    Cognitive Skills Attention;Problem Solve;Safety Awareness;Sequencing    Rehab Potential Good    Clinical Decision Making Limited treatment options, no task modification necessary    Comorbidities Affecting Occupational Performance: None    Modification or Assistance to Complete Evaluation  No modification  of tasks or assist necessary to complete eval    OT Frequency 1x / week    OT Duration 12 weeks   10 visits over 12 weeks   OT Treatment/Interventions Self-care/ADL training;Aquatic Therapy;Electrical Stimulation;Ultrasound;Moist Heat;Fluidtherapy;Therapeutic exercise;Neuromuscular education;Manual Therapy;Patient/family education;Functional Mobility Training;Visual/perceptual remediation/compensation;Passive range of motion;Cognitive remediation/compensation;Therapeutic activities;Splinting;DME and/or AE instruction    Plan Continue w/ RUE NMR   Consulted and Agree with Plan of Care Patient;Family member/caregiver    Family Member Consulted Larena Glassman (spouse)            Patient will benefit from skilled therapeutic intervention in order to improve the following deficits and impairments:   Body Structure / Function / Physical Skills: ADL, Balance, Coordination, ROM, IADL, Proprioception, UE functional use, Sensation, FMC, Strength, GMC, Tone, Decreased knowledge of use of DME Cognitive Skills: Attention, Problem Solve, Safety Awareness, Sequencing   Visit Diagnosis: 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)  Other lack of coordination  Muscle weakness (generalized)  Other symptoms and signs involving the nervous system  Other disturbances of skin sensation  Attention and concentration  deficit   Problem List Patient Active Problem List   Diagnosis Date Noted   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 [place], [place], [address] Phone: [phone]   Fax:  [phone]  Name: Janek Garden MRN: [id number] Date of Birth: 09/09/1965

## 2021-07-30 ENCOUNTER — Other Ambulatory Visit: Payer: Self-pay

## 2021-07-30 ENCOUNTER — Encounter: Payer: Self-pay | Admitting: Occupational Therapy

## 2021-07-30 ENCOUNTER — Encounter: Payer: Self-pay | Admitting: Speech Pathology

## 2021-07-30 ENCOUNTER — Ambulatory Visit: Payer: 59 | Admitting: Occupational Therapy

## 2021-07-30 ENCOUNTER — Ambulatory Visit: Payer: 59 | Admitting: Speech Pathology

## 2021-07-30 DIAGNOSIS — M6281 Muscle weakness (generalized): Secondary | ICD-10-CM | POA: Diagnosis not present

## 2021-07-30 DIAGNOSIS — I69151 Hemiplegia and hemiparesis following nontraumatic intracerebral hemorrhage affecting right dominant side: Secondary | ICD-10-CM

## 2021-07-30 DIAGNOSIS — R41840 Attention and concentration deficit: Secondary | ICD-10-CM

## 2021-07-30 DIAGNOSIS — R208 Other disturbances of skin sensation: Secondary | ICD-10-CM

## 2021-07-30 DIAGNOSIS — R41841 Cognitive communication deficit: Secondary | ICD-10-CM

## 2021-07-30 DIAGNOSIS — R2681 Unsteadiness on feet: Secondary | ICD-10-CM

## 2021-07-30 DIAGNOSIS — R278 Other lack of coordination: Secondary | ICD-10-CM

## 2021-07-30 DIAGNOSIS — R29818 Other symptoms and signs involving the nervous system: Secondary | ICD-10-CM

## 2021-07-30 NOTE — Therapy (Signed)
[facility] [address]. [place], [place], [address] Phone: [phone]   Fax:  [phone]  Occupational Therapy Treatment  Patient Details  Name: Wayne Mendez MRN: [id number] Date of Birth: 1965-08-13 Referring Provider (OT): Mariam Dollar PA   Encounter Date: 07/30/2021   OT End of Session - 07/30/21 1405     Visit Number 4    Number of Visits 10    Date for OT Re-Evaluation 10/06/21   10 visits over 12 weeks   Authorization Type UHC    Authorization Time Period $30 copay, VL: 30 hard max combined (ST, PT, OT)    Authorization - Number of Visits 10    OT Start Time 1403    OT Stop Time 1448    OT Time Calculation (min) 45 min    Activity Tolerance Patient tolerated treatment well    Behavior During Therapy WFL for tasks assessed/performed            Past Medical History:  Diagnosis Date   Hypertension     Past Surgical History:  Procedure Laterality Date   TYMPANOSTOMY TUBE PLACEMENT      There were no vitals filed for this visit.   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 No/denies             Treatment/Exercises - 07/30/21    Toileting  OT facilitated discussion regarding toileting at home, reviewing potential compensatory/adaptive strategies, as well as DME/AE prn, to improve independence and safety due to pt's wife's report of difficulty w/ activity at home. OT also provided education on relevant energy conservation strategies related to toileting tasks (e.g. decreasing sit-to-stands to 3x/toileting vs 4x); pt encouraged  to attempt this small change at home and report any difficulties/concerns/successes next session     Functional Transfers Completed squat-pivot transfer from w/c <> EOM toward L side at [facility] and bed mobility from sitting <> supine at [facility]    RUE NMR NMR of RUE focused this session on shoulder A/AAROM. Set of shoulder flex/ext and ext rotation completed w/ UE Ranger; pt benefited from verbal and tactile cues to emphasize activation vs increasing arc of motion. Able to achieve slight ext rot past neutral point w/ visual aid and elbow flexed at approx 90 degrees. OT then attempted elbow flex/ext while sitting EOM and due to increased compensation of shoulder int rot, OT repositioned pt supine on mat for increased scapular support w/ positive results. Support under RUE also incorporated to facilitate improved shoulder alignment during exercises. OT also demonstrated shoulder flexion in gravity-minimized positioned w/ pt in sidelying; to be attempted next week            OT Short Term Goals - 07/21/21 1638       OT SHORT TERM GOAL #1   Title Pt will be independent with initial HEP for RUE stretching and NMR    Time 4    Period Weeks    Status On-going    Target Date 08/11/21      OT SHORT TERM  GOAL #2   Title Pt will be verbalize understanding of sesnory strategies for increased safety with sensation impairments with RUE    Time 4    Period Weeks    Status On-going      OT SHORT TERM GOAL #3   Title Pt will report completing all dressing with set up assistance only    Baseline needing assistance for pulling pants up    Time 4    Period Weeks    Status On-going             OT Long Term Goals - 07/21/21 1638       OT LONG TERM GOAL #1   Title Pt will be independent with any updated HEPs    Time 12    Period Weeks    Status On-going    Target Date 10/06/21      OT LONG TERM GOAL #2   Title Pt will be independent with any splint wear and care PRN    Baseline currently pre fab  resting hand splint for RUE    Time 12    Period Weeks    Status On-going      OT LONG TERM GOAL #3   Title Pt will report taking self to bathroom and complete toileting with mod I    Time 12    Period Weeks    Status On-going      OT LONG TERM GOAL #4   Title Pt will demonstrate active shoulder flexion of 15 degrees or greater in RUE for progressing towards low level reaching    Baseline trace activation    Time 12    Period Weeks    Status On-going      OT LONG TERM GOAL #5   Title Pt will verbalize understanding of work related tasks and skills and return to work recommendations.    Time 12    Period Weeks    Status On-going             Plan - 07/30/21 1405     Clinical Impression Statement Per pt's request, OT reviewed shoulder/elbow exercises to be included in HEP w/ pt's wife due to report that his best friend (who was educated in prior session) is no longer in town. Pt does well in gravity-minimized planes and w/ closed-chain AAROM. Pt also continues to benefit from verbal/tactile cues to minimze/prevent compensatory patterns w/ OT also discussing benefit of incorporating breath into movement vs. straining to force activation. Per PT, OT also provided general overview of fitting for AFO w/ pt and his spouse agreeable to plan at this time; OT to contact rep to establish contact and/or set up appt.    OT Occupational Profile and History Problem Focused Assessment - Including review of records relating to presenting problem    Occupational performance deficits (Please refer to evaluation for details): ADL's;IADL's;Work;Leisure;Rest and Sleep    Body Structure / Function / Physical Skills ADL;Balance;Coordination;ROM;IADL;Proprioception;UE functional use;Sensation;FMC;Strength;GMC;Tone;Decreased knowledge of use of DME    Cognitive Skills Attention;Problem Solve;Safety Awareness;Sequencing    Rehab Potential Good    Clinical Decision Making Limited treatment options, no task  modification necessary    Comorbidities Affecting Occupational Performance: None    Modification or Assistance to Complete Evaluation  No modification of tasks or assist necessary to complete eval    OT Frequency 1x / week    OT Duration 12 weeks   10 visits over 12 weeks   OT Treatment/Interventions Self-care/ADL training;Aquatic Therapy;Lobbyist  Stimulation;Ultrasound;Moist Heat;Fluidtherapy;Therapeutic exercise;Neuromuscular education;Manual Therapy;Patient/family education;Functional Mobility Training;Visual/perceptual remediation/compensation;Passive range of motion;Cognitive remediation/compensation;Therapeutic activities;Splinting;DME and/or AE instruction    Plan Shoulder flexion in sidelying for gravity eliminated positioning; review sensory safety strategies and dressing tasks   Consulted and Agree with Plan of Care Patient;Family member/caregiver    Family Member Consulted Bethann Berkshire (spouse)            Patient will benefit from skilled therapeutic intervention in order to improve the following deficits and impairments:   Body Structure / Function / Physical Skills: ADL, Balance, Coordination, ROM, IADL, Proprioception, UE functional use, Sensation, FMC, Strength, GMC, Tone, Decreased knowledge of use of DME Cognitive Skills: Attention, Problem Solve, Safety Awareness, Sequencing   Visit Diagnosis: 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)  Other lack of coordination  Muscle weakness (generalized)  Other symptoms and signs involving the nervous system  Other disturbances of skin sensation  Attention and concentration deficit  Unsteadiness on feet   Problem List Patient Active Problem List   Diagnosis Date Noted   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. [place], [place], [address] Phone: [phone]   Fax:  [phone]  Name: Wayne Mendez MRN: [id number] Date of Birth: [birth year]

## 2021-07-31 NOTE — Therapy (Signed)
[facility] [address] [place], [place], [address] Phone: [phone]   Fax:  [phone]  Speech Language Pathology Treatment  Patient Details  Name: Wayne Mendez MRN: [id number] Date of Birth: [birth year] Referring Provider (SLP): Charlton Amor, PA-C   Encounter Date: 07/30/2021   End of Session - 07/30/21 1322     Visit Number 3    Number of Visits 17   requested 10 visits due to reported insurance limit   Date for SLP Re-Evaluation 10/09/21   written for 12 weeks to account for scheduling   Authorization Type UHC - 30 visit limit combined; $30 copay    Authorization - Number of Visits 10    Activity Tolerance Patient tolerated treatment well   limited by anxiety            Past Medical History:  Diagnosis Date   Hypertension     Past Surgical History:  Procedure Laterality Date   TYMPANOSTOMY TUBE PLACEMENT      There were no vitals filed for this visit.   Subjective Assessment - 07/30/21 1320     Subjective "The 'gray' (brain fog) is reducing."    Currently in Pain? No/denies                   ADULT SLP TREATMENT - 07/31/21 0836       General Information   Behavior/Cognition Alert;Cooperative      Treatment Provided   Treatment provided Cognitive-Linquistic      Cognitive-Linquistic Treatment   Treatment focused on Patient/family/caregiver education;Cognition    Skilled Treatment Scores from previous sesssion -> Attention MILD; Memory MILD; exec func MILD; Language MOD; Visuospatial WFL; Clock MILD.Began  education on attention impairment and how that can impact comprehension of information, memory, reasoning, problem solving etc. Discussed the "spoon theory" for cognitive/physical fatigue.      Assessment / Recommendations / Plan   Plan Continue with current plan of care      Progression Toward Goals   Progression toward goals Progressing toward goals                SLP  Short Term Goals - 07/31/21 0840       SLP SHORT TERM GOAL #1   Title Pt will complete CLQT and PROM in first 1-2 ST sessions    Time 6    Period Weeks    Status On-going    Target Date 08/19/21      SLP SHORT TERM GOAL #2   Title Pt will recall 3 memory strategies to increase recall of personally-relevant, important information at home given min A verbal cues    Time 6    Period Weeks    Status On-going    Target Date 08/19/21      SLP SHORT TERM GOAL #3   Title Pt will recall 3 attention strategies for optimization of focus during conversations to decrease cognitive fatigue and increase recall of important information given min A verbal cues    Time 6    Period Weeks    Status On-going    Target Date 08/19/21      SLP SHORT TERM GOAL #4   Title Pt will be 100% intelligible in 10+ minute conversation given occasional min A over 2 sessions    Time 6    Period Weeks    Status On-going    Target Date 08/19/21      SLP SHORT TERM GOAL #5  Title Pt will utilize word finding compensations in 10+ minute conversation given occasional min A over 2 sessions    Time 6    Period Weeks    Status On-going    Target Date 08/19/21              SLP Long Term Goals - 07/31/21 2585       SLP LONG TERM GOAL #1   Title Pt will report successful use of 2+ memory strategies to increase recall of personally-relevant, important information at home.    Time 12    Period Weeks    Status On-going    Target Date 10/02/21      SLP LONG TERM GOAL #2   Title Pt will report successful use of 2+ attention strategies for optimization of focus during conversations to increase recall of verbal/written information.    Time 12    Period Weeks    Status On-going    Target Date 10/02/21      SLP LONG TERM GOAL #3   Title Pt will maintain 100% intelligibility in 30+ minute mod complex conversation given rare min A    Time 12    Period Weeks    Status On-going    Target Date 10/02/21       SLP LONG TERM GOAL #4   Title Pt will effectively compensate for word finding  in 30+ minute mod complex conversation given rare min A    Time 12    Period Weeks    Status On-going    Target Date 10/02/21              Plan - 07/31/21 2778     Clinical Impression Statement See tx note. Continue with current POC. Skilled ST is warranted to address mild-moderate cognitive lingusitic skills, mild aphasia, and mild dysarthria to optimize return to baseline and increase QOL.    Speech Therapy Frequency 2x / week   requested 1x/week due to insurance visit limitation   Duration 12 weeks   written 12 weeks to account for scheduling   Treatment/Interventions Compensatory strategies;Functional tasks;Patient/family education;Cueing hierarchy;Cognitive reorganization;Multimodal communcation approach;SLP instruction and feedback;Internal/external aids;Compensatory techniques;Language facilitation    Potential to Achieve Goals Good    Consulted and Agree with Plan of Care Patient;Family member/caregiver             Patient will benefit from skilled therapeutic intervention in order to improve the following deficits and impairments:   Cognitive communication deficit    Problem List Patient Active Problem List   Diagnosis Date Noted   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 [place], [place], [address] Phone: [phone]   Fax:  [phone]   Name: Wayne Mendez MRN: [id number] Date of Birth: May 14, 1966

## 2021-08-04 ENCOUNTER — Ambulatory Visit: Payer: 59 | Admitting: Physical Therapy

## 2021-08-04 ENCOUNTER — Ambulatory Visit: Payer: 59 | Admitting: Occupational Therapy

## 2021-08-04 ENCOUNTER — Encounter: Payer: Self-pay | Admitting: Physical Therapy

## 2021-08-04 ENCOUNTER — Other Ambulatory Visit: Payer: Self-pay

## 2021-08-04 DIAGNOSIS — R2681 Unsteadiness on feet: Secondary | ICD-10-CM

## 2021-08-04 DIAGNOSIS — R41840 Attention and concentration deficit: Secondary | ICD-10-CM

## 2021-08-04 DIAGNOSIS — R29818 Other symptoms and signs involving the nervous system: Secondary | ICD-10-CM

## 2021-08-04 DIAGNOSIS — I69151 Hemiplegia and hemiparesis following nontraumatic intracerebral hemorrhage affecting right dominant side: Secondary | ICD-10-CM

## 2021-08-04 DIAGNOSIS — M6281 Muscle weakness (generalized): Secondary | ICD-10-CM | POA: Diagnosis not present

## 2021-08-04 DIAGNOSIS — R278 Other lack of coordination: Secondary | ICD-10-CM

## 2021-08-04 DIAGNOSIS — R208 Other disturbances of skin sensation: Secondary | ICD-10-CM

## 2021-08-04 NOTE — Therapy (Signed)
[facility]. [place], [place], [address] Phone: [phone]   Fax:  [phone]  Physical Therapy Treatment  Patient Details  Name: Wayne Mendez MRN: [id number] Date of Birth: 09/23/1965 Referring Provider (PT): Cathlyn Parsons, PA-C   Encounter Date: 08/04/2021   PT End of Session - 08/04/21 1802     Visit Number 3    Number of Visits 11    Date for PT Re-Evaluation 10/12/21    Authorization Type [employer] - $30 copay, VL: 30 hard max combined (ST, PT, OT)    PT Start Time 1406    PT Stop Time 1445    PT Time Calculation (min) 39 min    Equipment Utilized During Treatment Gait belt    Activity Tolerance Patient tolerated treatment well    Behavior During Therapy WFL for tasks assessed/performed             Past Medical History:  Diagnosis Date   Hypertension     Past Surgical History:  Procedure Laterality Date   TYMPANOSTOMY TUBE PLACEMENT      There were no vitals filed for this visit.   Subjective Assessment - 08/04/21 1412     Subjective Doing well, feel like I'm coming along even better than I was a couple of weeks ago when you met me.    Patient is accompained by: Family member    Pertinent History HTN.    Patient Stated Goals Wants to get out of his w/c, wants to be like he was before.    Currently in Pain? No/denies    Pain Score 0-No pain                               [facility] Adult PT Treatment/Exercise - 08/04/21 0001       Transfers   Squat Pivot Transfers 4: Min guard    Comments did need cues for correct "pizza slice" set up for safe transfers instad of taking side steps from surface to surface      Ambulation/Gait   Gait Comments 36f MinAx1 and WC follow with hemi walker   min guard of second person for safety     Knee/Hip Exercises: Aerobic   Nustep L3 x5 minutes BLEs only for reciprocal training   cues to prevent R LE from falling into ABD/ADD      Knee/Hip Exercises: Standing   Other Standing Knee Exercises forward steps with knee slightly bent (to untrain hyperextension tendency) 1x5 B, forward step then lateral step 1x5 B; toe taps on 4 inch box 1x5 B      Knee/Hip Exercises: Supine   Bridges Both;1 set;15 reps    Bridges Limitations MinA for positioning of R LE    Straight Leg Raises Right;1 set;5 reps    Straight Leg Raises Limitations MinA for motor control, able to complete more of this exercise but fatigued quickly    Other Supine Knee/Hip Exercises supine hip ABD 1x10 with MinA for good alignment/form                     PT Education - 08/04/21 1800     Education Details form of all activities today, cues for safety and sequencing    Person(s) Educated Patient;Spouse    Methods Explanation    Comprehension Verbalized understanding              PT Short  Term Goals - 07/14/21 1542       PT SHORT TERM GOAL #1   Title Pt will be independent with initial HEP with family supervision/assist in order to build upon functional gains made in therapy. ALL STGS DUE 08/11/21    Time 4    Period Weeks    Status New    Target Date 08/11/21      PT SHORT TERM GOAL #2   Title Pt will perform squat pivot transfer from w/c <> mat table with supervision in improve independence with transfers.    Baseline min guard    Time 4    Period Weeks    Status New      PT SHORT TERM GOAL #3   Title Pt will perform sit <> stands with RW with min guard in order to demo improved functional transfers and balance.    Baseline Min A    Time 4    Period Weeks    Status New      PT SHORT TERM GOAL #4   Title Pt will ambulate at least 53' with RW and R hand orthosis with min A x1 therapist in order to improve functional ambulation.    Time 4    Period Weeks    Status New               PT Long Term Goals - 07/14/21 1545       PT LONG TERM GOAL #1   Title Pt will be independent with final HEP with family  supervision/assist in order to build upon functional gains made in therapy. ALL LTGS DUE 09/08/21    Time 8    Period Weeks    Status New    Target Date 09/08/21      PT LONG TERM GOAL #2   Title Pt will perform stand pivot transfer from w/c <> mat table with RW and supervision in improve independence with transfers.    Baseline squat pivot with CGA    Time 8    Period Weeks    Status New      PT LONG TERM GOAL #3   Title Pt will perform sit <> stands with RW and supervision in order to demo improved functional mobility/improved transfers.    Time 8    Period Weeks    Status New      PT LONG TERM GOAL #4   Title Pt will undergo gait speed goal when appropriate with goal updated.    Time 8    Period Weeks    Status New      PT LONG TERM GOAL #5   Title Pt will ambulate at least 115' with RW and R hand orthosis with min guard/min A x1 therapist in order to improve functional ambulation.    Time 8    Period Weeks    Status New                   Plan - 08/04/21 1802     Clinical Impression Statement Wayne Mendez arrives today doing well, feels he has continued to improve even more since last time he was seen by PT. Introduced [employer] for Land today, did need Min cues for good alignment R LE but able to correct independently. Otherwise kept working on table level strengthening (all added to HEP), otherwise focused on reciprocal motion training and strengthening (exercises with L LE first, then with affected R LE) in standing. Did need MinA  for motor control and to help reduce hyperextension R knee. Able to walk a bit further today, might be able to progress to quad cane next session. Will plan to focus on family training with gait so that he can hopefully get more mobile at home safely.    Personal Factors and Comorbidities Comorbidity 1;Past/Current Experience;Time since onset of injury/illness/exacerbation    Comorbidities HTN    Examination-Activity  Limitations Bathing;Locomotion Level;Squat;Stand;Transfers;Hygiene/Grooming;Caring for Others    Examination-Participation Restrictions Driving;Community Activity;Occupation    Stability/Clinical Decision Making Evolving/Moderate complexity    Clinical Decision Making Moderate    Rehab Potential Good    PT Frequency 2x / week    PT Duration 4 weeks    PT Treatment/Interventions ADLs/Self Care Home Management;Aquatic Therapy;Scientist, product/process development;Therapeutic activities;Stair training;Gait training;Therapeutic exercise;Balance training;Neuromuscular re-education;Functional mobility training;Orthotic Fit/Training;Patient/family education;Energy conservation;Passive range of motion;Vestibular    PT Next Visit Plan focus on working on gait training with family- can we get him walking safely at home with their help?    Consulted and Agree with Plan of Care Patient;Family member/caregiver    Family Member Consulted spouse             Patient will benefit from skilled therapeutic intervention in order to improve the following deficits and impairments:  Abnormal gait, Decreased activity tolerance, Decreased coordination, Decreased balance, Decreased cognition, Decreased endurance, Decreased knowledge of precautions, Decreased mobility, Decreased range of motion, Decreased safety awareness, Decreased strength, Difficulty walking, Hypomobility, Impaired tone, Impaired sensation  Visit Diagnosis: Muscle weakness (generalized)  Other symptoms and signs involving the nervous system  Other lack of coordination     Problem List Patient Active Problem List   Diagnosis Date Noted   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 [place], [place], [address] Phone: [phone]   Fax:  [phone]  Name: Wayne Mendez MRN: [id number] Date of Birth: Mar 10, 1966

## 2021-08-05 ENCOUNTER — Other Ambulatory Visit: Payer: Self-pay

## 2021-08-05 MED ORDER — ATORVASTATIN CALCIUM 10 MG PO TABS: 10.0000 mg | ORAL_TABLET | Freq: Every day | ORAL | 1 refills | Status: AC

## 2021-08-05 MED ORDER — AMLODIPINE BESYLATE 10 MG PO TABS: 10.0000 mg | ORAL_TABLET | Freq: Every day | ORAL | 1 refills | Status: DC

## 2021-08-05 MED ORDER — LISINOPRIL 20 MG PO TABS: 20.0000 mg | ORAL_TABLET | Freq: Two times a day (BID) | ORAL | 1 refills | Status: DC

## 2021-08-05 NOTE — Therapy (Signed)
[facility] [address] [place], [place], [address] Phone: [phone]   Fax:  [phone]  Occupational Therapy Treatment  Patient Details  Name: Wayne Mendez MRN: [id number] Date of Birth: 02-Mar-1966 Referring Provider (OT): Mariam Dollar PA   Encounter Date: 08/04/2021   OT End of Session - 08/05/21 1444     Visit Number 5    Number of Visits 10    Date for OT Re-Evaluation 10/06/21   10 visits over 12 weeks   Authorization Type [employer]    Authorization Time Period $30 copay, VL: 30 hard max combined (ST, PT, OT)    Authorization - Number of Visits 10    OT Start Time 1312    OT Stop Time 1400    OT Time Calculation (min) 48 min    Activity Tolerance Patient tolerated treatment well    Behavior During Therapy WFL for tasks assessed/performed            Past Medical History:  Diagnosis Date   Hypertension     Past Surgical History:  Procedure Laterality Date   TYMPANOSTOMY TUBE PLACEMENT      There were no vitals filed for this visit.   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 No/denies             Treatment/Exercises - 08/04/21    ADLs: Toileting OT facilitated discussion regarding toilet transfer and positioning during toileting. Pt's spouse pulled up a picture of bathroom set-up w/ OT problem-solving w/ pt and his wife to identify safest and most efficient methods for toilet transfer considering small space requirements, placement of grab bars, and balance/gait deficits. OT recommended pulling from sit-to-stand,  finding his balance, adjusting LUE hand placement and taking 1 step toward toilet instead of turning almost 180 degrees to sit. OT told pt and his spouse to confirm this method w/ PT during next session prior to attempting at home; may also benefit from practice during next OT session            OT Education - 08/04/21 1444     Education Details Continued CVA condition-specific education including neuromuscular reeducation and typical recovery patterns, answering pt questions regarding anticipated timeline, phases of recovery, etc as best as able. OT also facilitated discussion addressing emotional and mental influences within pt's recovery process and participation in daily activities, working w/ pt and his spouse to problem-solve strategies/resources to improve understanding and sense of independence. Pt was encouraged to try to identify a hobby he would like to try or to find an activity/support group if he feels it would be beneficial. OT provided reputable and evidence-based resources (handout administered) to pt and his spouse to reference and utilize prn. Pt and his spouse were receptive and appreciative of conversation and all questions were answered as effectively as possible.   Person(s) Educated Patient;Spouse    Methods Explanation;Handout    Comprehension Verbalized understanding             OT Short Term Goals - 07/21/21 [phone]  OT SHORT TERM GOAL #1   Title Pt will be independent with initial HEP for RUE stretching and NMR    Time 4    Period Weeks    Status On-going    Target Date 08/11/21      OT SHORT TERM GOAL #2   Title Pt will be verbalize understanding of sesnory strategies for increased safety with sensation impairments with RUE    Time 4    Period Weeks    Status On-going      OT SHORT TERM GOAL #3   Title Pt will report completing all dressing with set up assistance only    Baseline needing assistance for pulling pants up    Time 4    Period Weeks     Status On-going             OT Long Term Goals - 07/21/21 1638       OT LONG TERM GOAL #1   Title Pt will be independent with any updated HEPs    Time 12    Period Weeks    Status On-going    Target Date 10/06/21      OT LONG TERM GOAL #2   Title Pt will be independent with any splint wear and care PRN    Baseline currently pre fab resting hand splint for RUE    Time 12    Period Weeks    Status On-going      OT LONG TERM GOAL #3   Title Pt will report taking self to bathroom and complete toileting with mod I    Time 12    Period Weeks    Status On-going      OT LONG TERM GOAL #4   Title Pt will demonstrate active shoulder flexion of 15 degrees or greater in RUE for progressing towards low level reaching    Baseline trace activation    Time 12    Period Weeks    Status On-going      OT LONG TERM GOAL #5   Title Pt will verbalize understanding of work related tasks and skills and return to work recommendations.    Time 12    Period Weeks    Status On-going             Plan - 08/04/21 1314     Clinical Impression Statement At start of session, OT briefly reviewed a few exercises included in pt's HEP and inquired about any updates w/ toilet transfers and toileting that were discussed briefly in previous session. This led to extensive discussion regarding independence w/ functional activities at home, emphasizing safety and practicing techniques/tasks pt is unsure about or uncomfortable w/ in therapy prior to attempting at home prn. OT also provided relevant condition-specific education and discussed emotional effects and impact during stroke recovery.   OT Occupational Profile and History Problem Focused Assessment - Including review of records relating to presenting problem    Occupational performance deficits (Please refer to evaluation for details): ADL's;IADL's;Work;Leisure;Rest and Sleep    Body Structure / Function / Physical Skills  ADL;Balance;Coordination;ROM;IADL;Proprioception;UE functional use;Sensation;FMC;Strength;GMC;Tone;Decreased knowledge of use of DME    Cognitive Skills Attention;Problem Solve;Safety Awareness;Sequencing    Rehab Potential Good    Clinical Decision Making Limited treatment options, no task modification necessary    Comorbidities Affecting Occupational Performance: None    Modification or Assistance to Complete Evaluation  No modification of tasks or assist necessary to complete eval    OT Frequency 1x /  week    OT Duration 12 weeks   10 visits over 12 weeks   OT Treatment/Interventions Self-care/ADL training;Aquatic Therapy;Electrical Stimulation;Ultrasound;Moist Heat;Fluidtherapy;Therapeutic exercise;Neuromuscular education;Manual Therapy;Patient/family education;Functional Mobility Training;Visual/perceptual remediation/compensation;Passive range of motion;Cognitive remediation/compensation;Therapeutic activities;Splinting;DME and/or AE instruction    Plan Shoulder flexion in sidelying for gravity eliminated positioning; review sensory safety strategies and dressing tasks    Consulted and Agree with Plan of Care Patient;Family member/caregiver    Family Member Consulted Bethann Berkshire (spouse)            Patient will benefit from skilled therapeutic intervention in order to improve the following deficits and impairments:   Body Structure / Function / Physical Skills: ADL, Balance, Coordination, ROM, IADL, Proprioception, UE functional use, Sensation, FMC, Strength, GMC, Tone, Decreased knowledge of use of DME Cognitive Skills: Attention, Problem Solve, Safety Awareness, Sequencing   Visit Diagnosis: 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)  Other lack of coordination  Attention and concentration deficit  Other disturbances of skin sensation  Unsteadiness on feet  Other symptoms and signs involving the nervous  system  Muscle weakness (generalized)   Problem List Patient Active Problem List   Diagnosis Date Noted   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 [place], [place], [address] Phone: [phone]   Fax:  [phone]  Name: Kaimen Peine MRN: [id number] Date of Birth: 1965-08-03

## 2021-08-06 DIAGNOSIS — I10 Essential (primary) hypertension: Secondary | ICD-10-CM | POA: Insufficient documentation

## 2021-08-06 NOTE — Assessment & Plan Note (Addendum)
NEW - with CVA one month ago. Started on Lisinopril, Amlodipine and Metoprolol, taking daily & tolerating, BP wnl today.

## 2021-08-07 ENCOUNTER — Ambulatory Visit: Payer: 59 | Admitting: Physical Therapy

## 2021-08-07 ENCOUNTER — Other Ambulatory Visit: Payer: Self-pay

## 2021-08-07 ENCOUNTER — Ambulatory Visit: Payer: 59 | Admitting: Occupational Therapy

## 2021-08-07 ENCOUNTER — Encounter: Payer: Self-pay | Admitting: Physical Therapy

## 2021-08-07 DIAGNOSIS — R278 Other lack of coordination: Secondary | ICD-10-CM

## 2021-08-07 DIAGNOSIS — M6281 Muscle weakness (generalized): Secondary | ICD-10-CM

## 2021-08-07 DIAGNOSIS — I69151 Hemiplegia and hemiparesis following nontraumatic intracerebral hemorrhage affecting right dominant side: Secondary | ICD-10-CM

## 2021-08-07 DIAGNOSIS — R29818 Other symptoms and signs involving the nervous system: Secondary | ICD-10-CM

## 2021-08-07 DIAGNOSIS — R2681 Unsteadiness on feet: Secondary | ICD-10-CM

## 2021-08-07 DIAGNOSIS — R41840 Attention and concentration deficit: Secondary | ICD-10-CM

## 2021-08-07 DIAGNOSIS — R208 Other disturbances of skin sensation: Secondary | ICD-10-CM

## 2021-08-07 NOTE — Therapy (Signed)
[facility] [address] [place], [place], [address] Phone: [phone]   Fax:  [phone]  Physical Therapy Treatment  Patient Details  Name: Wayne Mendez MRN: [id number] Date of Birth: 07/26/1965 Referring Provider (PT): Charlton Amor, PA-C   Encounter Date: 08/07/2021   PT End of Session - 08/07/21 1133     Visit Number 4    Number of Visits 11    Date for PT Re-Evaluation 10/12/21    PT Start Time 1017    PT Stop Time 1112    PT Time Calculation (min) 55 min    Equipment Utilized During Treatment Gait belt    Activity Tolerance Patient tolerated treatment well    Behavior During Therapy [facility] for tasks assessed/performed             Past Medical History:  Diagnosis Date   Hypertension     Past Surgical History:  Procedure Laterality Date   TYMPANOSTOMY TUBE PLACEMENT      There were no vitals filed for this visit.   Subjective Assessment - 08/07/21 1023     Subjective Patient reports improved abdominal activity last night. Jean Rosenthal, arrived with new AFOs for assessment.    Patient is accompained by: Family member    Pertinent History HTN.    Limitations Walking;House hold activities    Currently in Pain? No/denies                               [facility] Adult PT Treatment/Exercise - 08/07/21 0001       Ambulation/Gait   Gait Comments Patient received AFO today, walked with hemiwalker and with HHA. he demosntrated much improved ability to advance RLE in swing, minimal stance on RLE with inconsistent knee hyperextension, impvoed with cues to move his hips forward over RLE in stance. AFO noted to provide adequate support, allow for active PF and improve toe clearance, with significant improvement in energy use and quality of gait.      Neuro Re-ed    Neuro Re-ed Details  Patient observed to overuse LLE for all mobility, with minimal activity and WB through RLE. Facilitated increased WB  and functional use of RLE during transfers-educating him in squat pivot to each direction, providing min A to weight shift onto RLE and using tapping to facilitate activation of gluts and quad. Then showed him how to practice sit <> partial stand. He was noted to have no WB through RLE, so therapist facilitated weight shift onto the RLE and activated muscles as he performed mini weight shifts.  Moved to parallel bars and performed stanidng weight shifts in stride with RLE in front. Therapist used Mod tapping, VC and weight shifts to R to encourage upright posture and hip extension as he weigh shifted forward onto RLE. He moved sideways in bars and continued to perform the weight shifts, progressing to taking a step with LLE. He had great diffiuclty relaxing LLE as he activated R in stance, and also used LUE excessively at times. Improved with practice.                     PT Education - 08/07/21 1131     Education Details Extensive education to wife and patient regarding his overuse of L side during all activity. Educatd to risks of learned disuse of RLE, and initiated education of activities to perform at home to improve acitvation and WB  through RLE. Also encouraged to slow down and attend to RLE to be more aware of it during all mobility. Encouraged to perform squat pivot tranfers, demonstrated and he returned demonstration of functional use of RLE during transfers and partial stanidng weight shift.    Person(s) Educated Patient;Spouse    Methods Explanation;Demonstration    Comprehension Returned demonstration;Verbalized understanding              PT Short Term Goals - 08/07/21 1137       PT SHORT TERM GOAL #1   Title Pt will be independent with initial HEP with family supervision/assist in order to build upon functional gains made in therapy. ALL STGS DUE 08/11/21    Baseline Updating    Time 1    Period Weeks    Status On-going    Target Date 08/11/21      PT SHORT TERM  GOAL #2   Title Pt will perform squat pivot transfer from w/c <> mat table with supervision in improve independence with transfers.    Baseline Can perform with CGA, but overutilizes LLE, needs to pracitce involving RLE    Time 1    Period Weeks    Status On-going      PT SHORT TERM GOAL #3   Title Pt will perform sit <> stands with RW with min guard in order to demo improved functional transfers and balance.    Baseline Inconsistently min a due to LOB. He is impulsive and also only uses L side, causing him to be imbalanced.    Time 1    Period Weeks    Status On-going      PT SHORT TERM GOAL #4   Title Pt will ambulate at least 65' with RW and R hand orthosis with min A x1 therapist in order to improve functional ambulation.    Baseline 40' with hemiwalker, R AFO, CGA-min A. Multipel gait abnormalities, with minimal WB and activation of RLE.    Time 1    Period Weeks    Status On-going               PT Long Term Goals - 07/14/21 1545       PT LONG TERM GOAL #1   Title Pt will be independent with final HEP with family supervision/assist in order to build upon functional gains made in therapy. ALL LTGS DUE 09/08/21    Time 8    Period Weeks    Status New    Target Date 09/08/21      PT LONG TERM GOAL #2   Title Pt will perform stand pivot transfer from w/c <> mat table with RW and supervision in improve independence with transfers.    Baseline squat pivot with CGA    Time 8    Period Weeks    Status New      PT LONG TERM GOAL #3   Title Pt will perform sit <> stands with RW and supervision in order to demo improved functional mobility/improved transfers.    Time 8    Period Weeks    Status New      PT LONG TERM GOAL #4   Title Pt will undergo gait speed goal when appropriate with goal updated.    Time 8    Period Weeks    Status New      PT LONG TERM GOAL #5   Title Pt will ambulate at least 115' with RW and R hand orthosis with min  guard/min A x1 therapist in  order to improve functional ambulation.    Time 8    Period Weeks    Status New                   Plan - 08/07/21 1134     Clinical Impression Statement Patient received AFO for trial today, with significant improvement in gait using the AFO noted. Therapsit initiated patient and family education to improved automatic use and acitvation of RLE as he currently uses L side for all mobilty and does not WB or activate RLE in transfers or gait. he participated well. he is impulsive and requires frequent ltactile and VC to perform acitivties with control and with RLE engagement, but does appear to understand the concept and will practice at home.    Personal Factors and Comorbidities Comorbidity 1;Past/Current Experience;Time since onset of injury/illness/exacerbation    Comorbidities HTN    Examination-Activity Limitations Bathing;Locomotion Level;Squat;Stand;Transfers;Hygiene/Grooming;Caring for Others    Examination-Participation Restrictions Driving;Community Activity;Occupation    Stability/Clinical Decision Making Evolving/Moderate complexity    Rehab Potential Good    PT Frequency 2x / week    PT Duration 4 weeks    PT Treatment/Interventions ADLs/Self Care Home Management;Aquatic Therapy;Psychologist, educational;Therapeutic activities;Stair training;Gait training;Therapeutic exercise;Balance training;Neuromuscular re-education;Functional mobility training;Orthotic Fit/Training;Patient/family education;Energy conservation;Passive range of motion;Vestibular    PT Next Visit Plan RLE NM re-ed, emphasize RLE WB and activation in all transfer, standing, and gait. Facilitate toe off and forward progression in RLE in gait.    Consulted and Agree with Plan of Care Patient;Family member/caregiver    Family Member Consulted spouse             Patient will benefit from skilled therapeutic intervention in order to improve the following deficits and impairments:  Abnormal  gait, Decreased activity tolerance, Decreased coordination, Decreased balance, Decreased cognition, Decreased endurance, Decreased knowledge of precautions, Decreased mobility, Decreased range of motion, Decreased safety awareness, Decreased strength, Difficulty walking, Hypomobility, Impaired tone, Impaired sensation  Visit Diagnosis: 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)  Other lack of coordination  Attention and concentration deficit  Unsteadiness on feet  Other symptoms and signs involving the nervous system  Muscle weakness (generalized)     Problem List Patient Active Problem List   Diagnosis Date Noted   Essential hypertension 08/06/2021   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 [place], [place], [address] Phone: [phone]   Fax:  [phone]  Name: Holland Nickson MRN: [id number] Date of Birth: 07-Dec-1965

## 2021-08-08 NOTE — Therapy (Signed)
[facility] [address] [place], [place], [address] Phone: [phone]   Fax:  [phone]  Occupational Therapy Treatment  Patient Details  Name: Wayne Mendez MRN: [id number] Date of Birth: 1966-01-12 Referring Provider (OT): Mariam Dollar PA   Encounter Date: 08/07/2021   OT End of Session - 08/07/21 1120     Visit Number 6    Number of Visits 10    Date for OT Re-Evaluation 10/06/21   10 visits over 12 weeks   Authorization Type [employer]    Authorization Time Period $30 copay, VL: 30 hard max combined (ST, PT, OT)    Authorization - Number of Visits 10    OT Start Time 1115    OT Stop Time 1155    OT Time Calculation (min) 40 min    Activity Tolerance Patient tolerated treatment well    Behavior During Therapy WFL for tasks assessed/performed            Past Medical History:  Diagnosis Date   Hypertension     Past Surgical History:  Procedure Laterality Date   TYMPANOSTOMY TUBE PLACEMENT      There were no vitals filed for this visit.   Subjective Assessment - 08/07/21 1119     Subjective  Pt reports he feels like the R side of his trunk/core "woke up"; states he was able to complete bed mobility w/out assistance last night!    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 No/denies             OT Education - 08/07/21 1132    Education Details  Continued condition-specific education, particularly related to impact on proprioception and kinesthetic sense after CVA, spontaneous recovery, and spasticity   Person(s) Education Patient;Spouse   Methods Explanation   Comprehension Verbalized understanding            OT Treatment - 08/07/21    Functional Transfers  Completed squat-pivot transfer  toward L side from w/c <> EOM, cueing pt to maintain weight over RLE as much as possible throughout transfer vs. pushing to stand on LLE, per PT instruction. Completed both transfers w/ CGA and multimodal cues/breakdown of task for success    RUE NMR Attempted grasp and release of R hand w/ pt able to demonstrate composite finger flexion and isolated thumb flexion, but no extension for release; R hand spasticity limiting to repeated attempts w/ OT facilitating gentle stretch-and-hold of composite wrist and finger extension w/out adverse reaction. Pt able to achieve trace wrist flexion and no extension w/ OT positioning forearm in neutral for gravity-minimized wrist AROM. OT then facilitated forearm sup/pro w/ pt able to achieve poor pronation consistently and no supination.  Once transitioned to sitting EOM, OT again attempted wb through R elbow/forearm w/ OT providing significant support at R shoulder for joint protection. Attempted gentle contralateral body on arm movements for increased proprioceptive input through RUE w/ OT continuing to provide stability at shoulder posteriorly and anteriorly. OT instructed pt not to attempt wb exercises at home at this time due to support required at R shoulder; pt and spouse verbalized understanding.            OT Short Term Goals - 08/07/21 1056  OT SHORT TERM GOAL #1   Title Pt will be independent with initial HEP for RUE stretching and NMR    Time 4    Period Weeks    Status Achieved   08/07/21   Target Date 08/11/21      OT SHORT TERM GOAL #2   Title Pt will be verbalize understanding of sesnory strategies for increased safety with sensation impairments with RUE    Time 4    Period Weeks    Status On-going      OT SHORT TERM GOAL #3   Title Pt will report completing all dressing with set up assistance only    Baseline needing assistance for pulling pants up    Time 4    Period Weeks    Status On-going             OT Long Term  Goals - 07/21/21 1638       OT LONG TERM GOAL #1   Title Pt will be independent with any updated HEPs    Time 12    Period Weeks    Status On-going    Target Date 10/06/21      OT LONG TERM GOAL #2   Title Pt will be independent with any splint wear and care PRN    Baseline currently pre fab resting hand splint for RUE    Time 12    Period Weeks    Status On-going      OT LONG TERM GOAL #3   Title Pt will report taking self to bathroom and complete toileting with mod I    Time 12    Period Weeks    Status On-going      OT LONG TERM GOAL #4   Title Pt will demonstrate active shoulder flexion of 15 degrees or greater in RUE for progressing towards low level reaching    Baseline trace activation    Time 12    Period Weeks    Status On-going      OT LONG TERM GOAL #5   Title Pt will verbalize understanding of work related tasks and skills and return to work recommendations.    Time 12    Period Weeks    Status On-going             Plan - 08/07/21 1057     Clinical Impression Statement Pt received his RLE AFO in PT session prior to OT today w/ PT providing education on increased wb through RLE w/ all functional transfers and mobility; OT carried this over into OT session for consistency and to facilitate safety w/ transfers at home. OT then continued to facilitate NMR of RUE due to pt's report of increased sense of awareness of R side since last night. Pt continues to benefit from repetition and cues to decreased compensatory patterns due to tendency to activate whichever muscle groups will help him achieve the movement vs slowing down and attempting to really isolate targeted muscle groups; pt verbalized understanding and said this was a message also relayed in PT session this morning.    OT Occupational Profile and History Problem Focused Assessment - Including review of records relating to presenting problem    Occupational performance deficits (Please refer to evaluation  for details): ADL's;IADL's;Work;Leisure;Rest and Sleep    Body Structure / Function / Physical Skills ADL;Balance;Coordination;ROM;IADL;Proprioception;UE functional use;Sensation;FMC;Strength;GMC;Tone;Decreased knowledge of use of DME    Cognitive Skills Attention;Problem Solve;Safety Awareness;Sequencing    Rehab Potential Good  Clinical Decision Making Limited treatment options, no task modification necessary    Comorbidities Affecting Occupational Performance: None    Modification or Assistance to Complete Evaluation  No modification of tasks or assist necessary to complete eval    OT Frequency 1x / week    OT Duration 12 weeks   10 visits over 12 weeks   OT Treatment/Interventions Self-care/ADL training;Aquatic Therapy;Electrical Stimulation;Ultrasound;Moist Heat;Fluidtherapy;Therapeutic exercise;Neuromuscular education;Manual Therapy;Patient/family education;Functional Mobility Training;Visual/perceptual remediation/compensation;Passive range of motion;Cognitive remediation/compensation;Therapeutic activities;Splinting;DME and/or AE instruction    Plan Functional dressing; provide education on spasticity, sensory safety, and inattention to affected side    Consulted and Agree with Plan of Care Patient;Family member/caregiver    Family Member Consulted Bethann Berkshire (spouse)            Patient will benefit from skilled therapeutic intervention in order to improve the following deficits and impairments:   Body Structure / Function / Physical Skills: ADL, Balance, Coordination, ROM, IADL, Proprioception, UE functional use, Sensation, FMC, Strength, GMC, Tone, Decreased knowledge of use of DME Cognitive Skills: Attention, Problem Solve, Safety Awareness, Sequencing   Visit Diagnosis: 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)  Attention and concentration deficit  Other lack of coordination  Unsteadiness on feet  Other  symptoms and signs involving the nervous system  Muscle weakness (generalized)  Other disturbances of skin sensation   Problem List Patient Active Problem List   Diagnosis Date Noted   Essential hypertension 08/06/2021   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 [place], [place], [address] Phone: [phone]   Fax:  [phone]  Name: Wayne Mendez MRN: [id number] Date of Birth: [birth year]

## 2021-08-09 ENCOUNTER — Encounter: Payer: Self-pay | Admitting: Speech Pathology

## 2021-08-10 ENCOUNTER — Other Ambulatory Visit: Payer: Self-pay

## 2021-08-10 ENCOUNTER — Encounter: Payer: Self-pay | Admitting: Adult Health

## 2021-08-10 ENCOUNTER — Ambulatory Visit (INDEPENDENT_AMBULATORY_CARE_PROVIDER_SITE_OTHER): Payer: 59 | Admitting: Adult Health

## 2021-08-10 VITALS — BP 118/81 | HR 76 | Ht 72.0 in | Wt 192.0 lb

## 2021-08-10 DIAGNOSIS — I69151 Hemiplegia and hemiparesis following nontraumatic intracerebral hemorrhage affecting right dominant side: Secondary | ICD-10-CM

## 2021-08-10 DIAGNOSIS — I10 Essential (primary) hypertension: Secondary | ICD-10-CM

## 2021-08-10 DIAGNOSIS — I610 Nontraumatic intracerebral hemorrhage in hemisphere, subcortical: Secondary | ICD-10-CM

## 2021-08-10 DIAGNOSIS — Z09 Encounter for follow-up examination after completed treatment for conditions other than malignant neoplasm: Secondary | ICD-10-CM

## 2021-08-10 NOTE — Progress Notes (Signed)
[facility] [address] [place]. [place] [address] [phone]       HOSPITAL FOLLOW UP NOTE  [name] Date of Birth:  1965-12-13 Medical Record Number:  [id number]   Reason for Referral:  hospital stroke follow up    SUBJECTIVE:   CHIEF COMPLAINT:  Chief Complaint  Patient presents with   Hospitalization Follow-up    Room 3. Pt here with wife. They report that he's been doing well.     HPI:   [name] is a 56 y.o. male with history of HTN, non compliant with medications, who presented on 06/15/2021 with right sided weakness and slurred speech. He was walking into the gym when this occurred. EMS was called and his BP on the scene was 240/110.  Personally reviewed hospitalization pertinent progress notes, lab work and imaging.  Evaluated by Dr. Erlinda Hong.  Initial NIH 11. Initial CT scan shows IPH in left basal ganglia and external capsule region. Repeat CT stable and CTA showed no LVO, aneurysm, or vascular malformation. MRI showed stable L BG ICH without mass, lesion or enhancement.  EF 65 to 70%.  LDL 103.  A1c 5.7.  Etiology likely due to uncontrolled HTN with noncompliance on prior regimen, placed on lisinopril, amlodipine and metoprolol with stabilize BP.  Recommend atorvastatin 10mg  daily at d/c.  Other stroke risk factors include EtOH use but no prior stroke history. Per therapy eval's, discharged to [facility] on 12/30   Today, 08/10/2021, patient being seen for initial hospital follow-up accompanied by his wife.  Currently working with PT/OT/SLP reporting residual right sided weakness, slurred speech and aphasia, voice changes, cognitive difficulties and impaired gait but overall greatly improving. Currently ambulating short distances with RW during PT sessions. Will experience some tightness sensation on right side but denies painful sensation.  Has since had f/u with PMR Dr. Dagoberto Ligas - started baclofen for spasticity and continued on amantadine. Has not  tried baclofen as felt it has not been needed. F/u visit 4/3. Initially struggling with anxiety post stroke, this has since improved and stopped antianxiety medications initially prescribed by PCP. Initially after hospital discharge, wife concerned regarding headaches which have since resolved.  Denies new stroke/TIA symptoms.  Compliant on atorvastatin without side effects. Blood pressure today 118/81 - reports compliance on amlodipine, lisinopril and metoprolol.  Routinely monitors at home with BP 90-110s/50-60s. Dr. Dagoberto Ligas recently decreased metoprolol dosage due to low BP and HR, wife questions if this is managed by our office as she is concerned levels are still low.  Multiple other questions by wife which were answered during visit.  No further concerns at this time.      PERTINENT IMAGING  Per hospitalization 06/15/2021 CT - IPH in the left basal ganglia and external capsule region. Focal hematoma with estimated volume of 23 cc. Mild surrounding edema and mass effect.  CTA head & neck -No intracranial large vessel occlusion or stenosis. no aneurysm or vascular malformation. MRI left BG ICH, stable, no mass lesion or enhancement 2D Echo EF 65-70% LDL 103 HgbA1c 5.7    ROS:   14 system review of systems performed and negative with exception of those listed in HPI  PMH:  Past Medical History:  Diagnosis Date   Hypertension     PSH:  Past Surgical History:  Procedure Laterality Date   TYMPANOSTOMY TUBE PLACEMENT      Social History:  Social History   Socioeconomic History   Marital status: Married    Spouse name:  Not on file   Number of children: Not on file   Years of education: Not on file   Highest education level: Not on file  Occupational History   Not on file  Tobacco Use   Smoking status: Never   Smokeless tobacco: Never  Vaping Use   Vaping Use: Never used  Substance and Sexual Activity   Alcohol use: Yes    Comment: rare   Drug use: Not Currently    Sexual activity: Not Currently  Other Topics Concern   Not on file  Social History Narrative   Not on file   Social Determinants of Health   Financial Resource Strain: Not on file  Food Insecurity: Not on file  Transportation Needs: Not on file  Physical Activity: Not on file  Stress: Not on file  Social Connections: Not on file  Intimate Partner Violence: Not on file    Family History: No family history on file.  Medications:   Current Outpatient Medications on File Prior to Visit  Medication Sig Dispense Refill   amantadine (SYMMETREL) 100 MG capsule Take 1 capsule (100 mg total) by mouth daily. 30 capsule 5   amLODipine (NORVASC) 10 MG tablet Take 1 tablet (10 mg total) by mouth daily. 90 tablet 1   atorvastatin (LIPITOR) 10 MG tablet Take 1 tablet (10 mg total) by mouth daily. 90 tablet 1   lisinopril (ZESTRIL) 20 MG tablet Take 1 tablet (20 mg total) by mouth 2 (two) times daily. 180 tablet 1   metoprolol tartrate (LOPRESSOR) 50 MG tablet Take 1 tablet (50 mg total) by mouth 2 (two) times daily. 60 tablet 5   Multiple Vitamin (MULTIVITAMIN) tablet Take 1 tablet by mouth daily.     No current facility-administered medications on file prior to visit.    Allergies:  No Known Allergies    OBJECTIVE:  Physical Exam  Vitals:   08/10/21 1420  BP: 118/81  Pulse: 76  Height: 6' (1.829 m)   Body mass index is 28.94 kg/m. No results found.  Poststroke PHQ 2/9 Depression screen PHQ 2/9 07/23/2021  Decreased Interest 0  Down, Depressed, Hopeless 0  PHQ - 2 Score 0  Altered sleeping 0  Tired, decreased energy 0  Change in appetite 0  Feeling bad or failure about yourself  0  Trouble concentrating 0  Moving slowly or fidgety/restless 0  Suicidal thoughts 0  PHQ-9 Score 0     General: well developed, well nourished, very pleasant middle-age Caucasian male, seated, in no evident distress Head: head normocephalic and atraumatic.   Neck: supple with no carotid or  supraclavicular bruits Cardiovascular: regular rate and rhythm, no murmurs Musculoskeletal: no deformity Skin:  no rash/petichiae Vascular:  Normal pulses all extremities   Neurologic Exam Mental Status: Awake and fully alert.  Mild dysarthria and occasional speech hesitancy but unable to appreciate true aphasia.  Oriented to place and time. Recent and remote memory intact. Attention span, concentration and fund of knowledge appropriate during visit. Mood and affect appropriate.  Cranial Nerves: Fundoscopic exam reveals sharp disc margins. Pupils equal, briskly reactive to light. Extraocular movements full without nystagmus. Visual fields full to confrontation. Hearing intact. Facial sensation intact.  Right lower facial weakness.  Tongue, palate moves normally and symmetrically.  Motor: Normal strength, bulk and tone left upper and lower extremity RUE: 2/5 deltoid, 3/5 elbow flexion, 3+/5 elbow extension, 1/5 hand grip RLE: 3/5 hip flexor, 4/5 KE and KF, 1/5 ADF, 3/5 APF Increased tone right upper  and lower extremity Sensory.: intact to touch , pinprick , position and vibratory sensation.  Coordination: Rapid alternating movements normal on left side. Finger-to-nose and heel-to-shin performed accurately on left side. Gait and Station: deferred as RW not present Reflexes: 1+ and symmetric. Toes downgoing.     NIHSS  7 Modified Rankin  3-4      ASSESSMENT: Melio Wasil is a 56 y.o. year old male with left BG ICH on 06/15/2021 likely due to uncontrolled HTN. Vascular risk factors include medication noncompliance, HTN, HLD and EtOH use.      PLAN:  L BG ICH :  Residual deficit: right spastic hemiparesis and dysarthria. Continue NR PT/OT/SLP for hopeful ongoing recovery.  Continue to follow with PMR for residual deficits and short term disability management.  Wife concerned that if she has issues or questions in the future, she does not know who to call - advised any new concerns to  first follow up with PCP who can then decide if further evaluation is needed from specialty but if related to stroke, can discuss with Dr. Dagoberto Ligas.  continue atorvastatin 10mg  daily  for secondary stroke prevention.   Discussed secondary stroke prevention measures and importance of close PCP follow up for aggressive stroke risk factor management. I have gone over the pathophysiology of stroke, warning signs and symptoms, risk factors and their management in some detail with instructions to go to the closest emergency room for symptoms of concern. HTN: BP goal <130/90.  Stable on lower side on metoprolol, lisinopril and amlodipine - advised further management should be managed by PCP who will manage long term HLD: LDL goal <70. Recent LDL 103.  Continue atorvastatin 10 mg daily - advised monitoring/management and prescribing can be managed by PCP   Doing well from stroke standpoint without further recommendations and risk factors are managed by PCP. He may follow up PRN, as usual for our patients who are strictly being followed for stroke. If any new neurological issues should arise, request PCP place referral for evaluation by one of our neurologists. Thank you.     CC:  [facility] provider: Dr. Leonie Man PCP: Jeanie Sewer, NP    I spent 59 minutes of face-to-face and non-face-to-face time with patient and wife.  This included previsit chart review including review of recent hospitalization, lab review, study review, electronic health record documentation, patient and wife education regarding recent stroke including etiology, secondary stroke prevention measures and importance of managing stroke risk factors, residual deficits and typical recovery time and answered all other multiple questions to patient and wife's satisfaction  Frann Rider, AGNP-BC  [facility] [address] [place], [place] [address]  Phone [phone] Fax [phone] Note: This document was  prepared with digital dictation and possible smart phrase technology. Any transcriptional errors that result from this process are unintentional.

## 2021-08-10 NOTE — Patient Instructions (Addendum)
Continue atorvastatin 10mg  daily  for secondary stroke prevention  Continue participating in outpatient therapies for hopeful ongoing recovery. Continue to follow with Dr. Dagoberto Ligas as scheduled   Continue to follow up with PCP regarding cholesterol and blood pressure management  Maintain strict control of hypertension with blood pressure goal below 130/90 and cholesterol with LDL cholesterol (bad cholesterol) goal below 70 mg/dL.   Signs of a Stroke? Follow the BEFAST method:  Balance Watch for a sudden loss of balance, trouble with coordination or vertigo Eyes Is there a sudden loss of vision in one or both eyes? Or double vision?  Face: Ask the person to smile. Does one side of the face droop or is it numb?  Arms: Ask the person to raise both arms. Does one arm drift downward? Is there weakness or numbness of a leg? Speech: Ask the person to repeat a simple phrase. Does the speech sound slurred/strange? Is the person confused ? Time: If you observe any of these signs, call [phone].      Thank you for coming to see [place] at [facility]. I hope we have been able to provide you high quality care today.  You may receive a patient satisfaction survey over the next few weeks. We would appreciate your feedback and comments so that we may continue to improve ourselves and the health of our patients.    Hemorrhagic Stroke A hemorrhagic stroke happens when a blood vessel in the brain leaks or bursts (ruptures). This causes bleeding in or around the brain (hemorrhage) and leads to the sudden death of brain tissue. A hemorrhagic stroke is a medical emergency. It can cause brain damage and death. There are two major types of hemorrhagic stroke: Intracerebral hemorrhage. This happens when bleeding occurs within the brain tissue. Subarachnoid hemorrhage. This happens when bleeding occurs in the area between the brain and the membrane that covers the brain (subarachnoid space). What are the  causes? This condition may be caused by: Head injury (trauma). Part of a weakened blood vessel wall bulging or ballooning out (cerebral aneurysm). Thin and hardened blood vessels due to plaque buildup. Tangled blood vessels in the brain (arteriovenous malformation). Protein buildup on the artery walls of the brain (amyloid angiopathy). Inflamed blood vessels (vasculitis). A brain tumor. Sometimes, the cause of this condition is not known. What increases the risk? The following factors may make you more likely to develop this condition: Hypertension. Having abnormal blood vessels present since birth (congenital abnormality). Bleeding disorders, such as hemophilia, sickle cell disease, or liver disease. Blood thinners (anticoagulants) that make the blood too thin. Being an older adult. Moderate or heavy alcohol use. Using drugs, such as cocaine or methamphetamines. What are the signs or symptoms? Symptoms of this condition include: The sudden onset of: Weakness or numbness of the face, arm, or leg, especially on one side of the body. Confusion. Trouble speaking or understanding speech. Difficulty seeing in one or both eyes. Difficulty walking or moving the arms or legs. Dizziness, or loss of balance or coordination. Nausea and vomiting. A severe headache with no known cause. This headache may feel like the worst one a person has ever had. Seizures. How is this diagnosed? This condition may be diagnosed based on: Your symptoms, medical history, and a physical exam. Tests, including: Blood tests. CT scan. MRI. CT angiography (CTA) or magnetic resonance angiography (MRA). Catheter angiogram. In this procedure, dye is injected through a long, thin tube (catheter) into one of your arteries. X-rays are  taken and will show whether a blockage or a problem in a blood vessel exists. How is this treated? The goals of treatment are to stop the bleeding, reduce pressure on the brain,  relieve symptoms, and prevent complications. Treatment may include: Medicines that do the following: Lower blood pressure (antihypertensives). Relieve pain (analgesics), fever, nausea, or vomiting. Stop or prevent seizures (anticonvulsants). Prevent blood vessels in the brain from spasming in response to bleeding. Control bleeding in the brain. Use of a machine to help you breathe (ventilator). A blood transfusion to help your blood clot. Placement of a tube (shunt) in the brain to relieve pressure. Physical, speech, or occupational therapy. Surgery to stop the bleeding, remove a blood clot or tumor, or reduce pressure. Treatment depends on the cause, severity, and duration of symptoms. Talk with your health care provider about what to expect during your recovery. Follow these instructions at home: Activity Use a walker or a cane as told by your health care provider. Return to your normal activities as told by your health care provider. Ask your health care provider what activities are safe for you. Rest to help your brain heal. Make sure you: Get plenty of sleep. Avoid staying up late at night. Keep to a sleep schedule. Go to sleep and wake up at about the same time every day. Avoid activities that cause physical or mental stress. Lifestyle Do not drink alcohol if: Your health care provider tells you not to drink. You are pregnant, may be pregnant, or are planning to become pregnant. If you drink alcohol: Limit how much you use to: 0-1 drink a day for women. 0-2 drinks a day for men. Know how much alcohol is in your drink. In the [place] one drink equals one 12 oz bottle of beer (355 mL), one 5 oz glass of wine (148 mL), or one 1 oz glass of hard liquor (44 mL). General instructions Do not use any products that contain nicotine or tobacco. These include cigarettes, chewing tobacco, and vaping devices, such as e-cigarettes. If you need help quitting, ask your health care provider. Do  not drive or use heavy machinery until your health care provider approves. Take over-the-counter and prescription medicines only as told by your health care provider. Keep all follow-up visits, including those with therapists. This is important. How is this prevented? You can reduce your risk of stroke by managing conditions, such as: High blood pressure. High cholesterol. Diabetes. Heart disease. Obesity. Other factors and lifestyle changes that can lower your risk include: Quitting smoking, limiting alcohol, and staying physically active. Having your bloodwork monitored frequently if you take anticoagulants (blood thinners). Contact a health care provider if: You develop any of the following symptoms: Headaches that keep coming back (that are chronic). Nausea. Vision problems. Increased sensitivity to noise or light. Depression, mood swings, anxiety, or irritability. Memory problems. Difficulty concentrating or paying attention. Sleep problems. Feeling tired all of the time. Get help right away if: You have a partial or total loss of consciousness. You are taking blood thinners and you fall or have a minor injury to the head. You have a bleeding disorder and you fall, or you have a minor trauma to the head. You have any symptoms of a stroke. "BE FAST" is an easy way to remember the main warning signs of a stroke: B - Balance. Signs are dizziness, sudden trouble walking, or loss of balance. E - Eyes. Signs are trouble seeing or a sudden change in vision. F -  Face. Signs are sudden weakness or numbness of the face, or the face or eyelid drooping on one side. A - Arms. Signs are weakness or numbness in an arm. This happens suddenly and usually on one side of the body. S - Speech. Signs are sudden trouble speaking, slurred speech, or trouble understanding what people say. T - Time. Time to call emergency services. Write down what time symptoms started. You have other signs of a  stroke, such as: A sudden, severe headache with no known cause. Nausea or vomiting. Seizure. These symptoms may represent a serious problem that is an emergency. Do not wait to see if the symptoms will go away. Get medical help right away. Call your local emergency services (911 in the [place] Do not drive yourself to the hospital. Summary Hemorrhagic stroke is caused by bleeding in or around the brain. Hemorrhagic stroke is a medical emergency. Know the signs and symptoms of stroke. You can reduce your risk of stroke by managing conditions, quitting smoking, and making other lifestyle changes. This information is not intended to replace advice given to you by your health care provider. Make sure you discuss any questions you have with your health care provider. Document Revised: 03/11/2020 Document Reviewed: 03/11/2020 Elsevier Patient Education  2022 [employer].

## 2021-08-10 NOTE — Progress Notes (Signed)
I agree with the above plan 

## 2021-08-11 ENCOUNTER — Encounter: Payer: Self-pay | Admitting: Occupational Therapy

## 2021-08-11 ENCOUNTER — Encounter: Payer: Self-pay | Admitting: Physical Therapy

## 2021-08-11 ENCOUNTER — Other Ambulatory Visit: Payer: Self-pay

## 2021-08-11 ENCOUNTER — Ambulatory Visit: Payer: 59 | Admitting: Occupational Therapy

## 2021-08-11 ENCOUNTER — Encounter: Payer: Self-pay | Admitting: Speech Pathology

## 2021-08-11 ENCOUNTER — Ambulatory Visit: Payer: 59 | Admitting: Physical Therapy

## 2021-08-11 ENCOUNTER — Ambulatory Visit: Payer: 59 | Admitting: Speech Pathology

## 2021-08-11 DIAGNOSIS — R41841 Cognitive communication deficit: Secondary | ICD-10-CM

## 2021-08-11 DIAGNOSIS — R29818 Other symptoms and signs involving the nervous system: Secondary | ICD-10-CM

## 2021-08-11 DIAGNOSIS — R41840 Attention and concentration deficit: Secondary | ICD-10-CM

## 2021-08-11 DIAGNOSIS — R2681 Unsteadiness on feet: Secondary | ICD-10-CM

## 2021-08-11 DIAGNOSIS — M6281 Muscle weakness (generalized): Secondary | ICD-10-CM | POA: Diagnosis not present

## 2021-08-11 DIAGNOSIS — I69151 Hemiplegia and hemiparesis following nontraumatic intracerebral hemorrhage affecting right dominant side: Secondary | ICD-10-CM

## 2021-08-11 DIAGNOSIS — R278 Other lack of coordination: Secondary | ICD-10-CM

## 2021-08-11 NOTE — Therapy (Signed)
[facility] [address] [place], [place], [address] Phone: [phone]   Fax:  [phone]  Occupational Therapy Treatment  Patient Details  Name: Wayne Mendez MRN: [id number] Date of Birth: 05-Jan-1966 Referring Provider (OT): Mariam Dollar PA   Encounter Date: 08/11/2021   OT End of Session - 08/11/21 1323     Visit Number 7    Number of Visits 10    Date for OT Re-Evaluation 10/06/21   10 visits over 12 weeks   Authorization Type UHC    Authorization Time Period $30 copay, VL: 30 hard max combined (ST, PT, OT)    Authorization - Number of Visits 10    OT Start Time 1317    OT Stop Time 1402    OT Time Calculation (min) 45 min    Activity Tolerance Patient tolerated treatment well    Behavior During Therapy WFL for tasks assessed/performed            Past Medical History:  Diagnosis Date   Hypertension     Past Surgical History:  Procedure Laterality Date   TYMPANOSTOMY TUBE PLACEMENT      There were no vitals filed for this visit.   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 No/denies             OT Education - 08/11/21 1322     Education Details OT discussed sensory safety strategies, spatial inattention, and stages of spasticity, discussing w/ pt that patients may not progress through all stages, that there is high variability found depending on severity and location of CVA, and that there is not a defined timeline across proposed stages; corresponding handouts provided to pt    Person(s)  Educated Patient;Spouse    Methods Explanation;Handout    Comprehension Verbalized understanding             OT Treatment - 08/11/21    NMR Session focused on NMR of RUE, including review of exercises included in current HEP. Pt demonstrated shoulder shrugs and scapular retraction sitting EOM w/ good activation of retraction and increased activation of upper trap palpated during elevation. OT then attempted to facilitate shoulder flexion, providing support under UE, and elbow flexion/extension while sitting EOM. Pt continued to demonstrate compensatory movement patterns after multiple attempts and pt was transitioned to supine for increased scapular support and alignment; pt completed sit-to-supine transition at [facility]. OT then facilitated closed-chain shoulder press w/ unweighted dowel; towel roll support positioned under RUE. Pt able to achieve fair shoulder activation inconsistently in 10 attempts. OT also attempted to facilitate elbow extension (gravity-assisted), external rotation (gravity-assisted), and equal isometric contraction to hold elbow flexion at approx 90 degrees of flexion (gravity minimized); strength of shoulder internal rotators limiting, particularly w/ ext rot and isometric contraction. Closed-chain chest press then attempted again to ensure good carryover to home w/ towel roll support and LUE stationary on pillow to provide increased visual feedback of RUE activation w/ positive results; consistency w/ activation increased and arc of motion also increased.  OT Short Term Goals - 08/07/21 1056       OT SHORT TERM GOAL #1   Title Pt will be independent with initial HEP for RUE stretching and NMR    Time 4    Period Weeks    Status Achieved   08/07/21   Target Date 08/11/21      OT SHORT TERM GOAL #2   Title Pt will be verbalize understanding of sesnory strategies for increased safety with sensation impairments with RUE    Time 4    Period Weeks    Status  On-going      OT SHORT TERM GOAL #3   Title Pt will report completing all dressing with set up assistance only    Baseline needing assistance for pulling pants up    Time 4    Period Weeks    Status On-going             OT Long Term Goals - 07/21/21 1638       OT LONG TERM GOAL #1   Title Pt will be independent with any updated HEPs    Time 12    Period Weeks    Status On-going    Target Date 10/06/21      OT LONG TERM GOAL #2   Title Pt will be independent with any splint wear and care PRN    Baseline currently pre fab resting hand splint for RUE    Time 12    Period Weeks    Status On-going      OT LONG TERM GOAL #3   Title Pt will report taking self to bathroom and complete toileting with mod I    Time 12    Period Weeks    Status On-going      OT LONG TERM GOAL #4   Title Pt will demonstrate active shoulder flexion of 15 degrees or greater in RUE for progressing towards low level reaching    Baseline trace activation    Time 12    Period Weeks    Status On-going      OT LONG TERM GOAL #5   Title Pt will verbalize understanding of work related tasks and skills and return to work recommendations.    Time 12    Period Weeks    Status On-going             Plan - 08/11/21 1323     Clinical Impression Statement Due to pt's report of increased R-sided activation recently, OT continued to address RUE NMR, trialing various positioning and supports to facilitate success. Most success w/ activation and decreased compensatory patterns observed w/ pt in supine, likely due to increased scapular support in depression and retraction. Spasticity was also more noticeable this session, particularly of wrist and hand, but was not limiting to ROM at this time. Pt will continue to benefit of addressing isolated shoulder movements, particularly external rotation and abduction.    OT Occupational Profile and History Problem Focused Assessment - Including review of records  relating to presenting problem    Occupational performance deficits (Please refer to evaluation for details): ADL's;IADL's;Work;Leisure;Rest and Sleep    Body Structure / Function / Physical Skills ADL;Balance;Coordination;ROM;IADL;Proprioception;UE functional use;Sensation;FMC;Strength;GMC;Tone;Decreased knowledge of use of DME    Cognitive Skills Attention;Problem Solve;Safety Awareness;Sequencing    Rehab Potential Good    Clinical Decision Making Limited treatment options, no task modification necessary    Comorbidities Affecting Occupational Performance: None    Modification or Assistance  to Complete Evaluation  No modification of tasks or assist necessary to complete eval    OT Frequency 1x / week    OT Duration 12 weeks   10 visits over 12 weeks   OT Treatment/Interventions Self-care/ADL training;Aquatic Therapy;Electrical Stimulation;Ultrasound;Moist Heat;Fluidtherapy;Therapeutic exercise;Neuromuscular education;Manual Therapy;Patient/family education;Functional Mobility Training;Visual/perceptual remediation/compensation;Passive range of motion;Cognitive remediation/compensation;Therapeutic activities;Splinting;DME and/or AE instruction    Plan Continue w/ RUE NMR (shoulder flex/ext, ext rot; elbow flex/ext; forearm rotation)    OT Home Exercise Plan MedBridge Code: [email or web address]    Consulted and Agree with Plan of Care Patient;Family member/caregiver    Family Member Consulted Bethann Berkshire (spouse)            Patient will benefit from skilled therapeutic intervention in order to improve the following deficits and impairments:   Body Structure / Function / Physical Skills: ADL, Balance, Coordination, ROM, IADL, Proprioception, UE functional use, Sensation, FMC, Strength, GMC, Tone, Decreased knowledge of use of DME Cognitive Skills: Attention, Problem Solve, Safety Awareness, Sequencing   Visit Diagnosis: 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 [place], [place], [address] Phone: [phone]   Fax:  [phone]  Name: Wayne Mendez MRN: [id number] Date of Birth: [birth year]

## 2021-08-11 NOTE — Therapy (Signed)
[facility] [address] [place], [place], [address] Phone: [phone]   Fax:  [phone]  Physical Therapy Treatment  Patient Details  Name: Wayne Mendez MRN: [id number] Date of Birth: 1966-02-29 Referring Provider (PT): Charlton Amor, PA-C   Encounter Date: 08/11/2021   PT End of Session - 08/11/21 1454     Visit Number 5    Number of Visits 11    Date for PT Re-Evaluation 10/12/21    Authorization Type UHC - $30 copay, VL: 30 hard max combined (ST, PT, OT)    PT Start Time 1405    PT Stop Time 1443    PT Time Calculation (min) 38 min    Activity Tolerance Patient tolerated treatment well    Behavior During Therapy WFL for tasks assessed/performed             Past Medical History:  Diagnosis Date   Hypertension     Past Surgical History:  Procedure Laterality Date   TYMPANOSTOMY TUBE PLACEMENT      There were no vitals filed for this visit.   Subjective Assessment - 08/11/21 1408     Subjective Doing well, able to use arm and hand a little more; have been walking at home with trial AFO and doing well    Patient is accompained by: Family member    Pertinent History HTN.    Patient Stated Goals Wants to get out of his w/c, wants to be like he was before.    Currently in Pain? No/denies                               [facility] Adult PT Treatment/Exercise - 08/11/21 0001       Knee/Hip Exercises: Aerobic   Nustep L8 BLEs only x4 minutes cues for high quality push R LE      Knee/Hip Exercises: Standing   Gait Training gait 43ft x2 with RW with min guard/one time ModA for balance when he accidentally pushed RW too far ahead   needed MinA-ModA on second bout of gait   Other Standing Knee Exercises forward weight shifts over R LE with toe up L LE 2x10 mirror feedback to help prevent kne hyperextension      Knee/Hip Exercises: Seated   Long Arc Quad Right;1 set;5 reps    Long Arc Quad  Limitations PROM into extension, heavy tactile cues for eccentric lower of quad    Knee/Hip Flexion very mini squats with L LE blocked in NWB, ModA to slightly lift hips up off of table and push through R LE;   x2 rounds from elevated surface on second round   Other Seated Knee/Hip Exercises L leg crossed over R for increased WB on R LE   x10 to help get used to [employer] R LE                    PT Education - 08/11/21 1454     Education Details exercise from and technique, importance of improving wt shift over R LE in order to speed up functional changes and take stress off of L LE, normalize gait pattern    Person(s) Educated Patient    Methods Explanation    Comprehension Verbalized understanding              PT Short Term Goals - 08/07/21 1137       PT SHORT TERM  GOAL #1   Title Pt will be independent with initial HEP with family supervision/assist in order to build upon functional gains made in therapy. ALL STGS DUE 08/11/21    Baseline Updating    Time 1    Period Weeks    Status On-going    Target Date 08/11/21      PT SHORT TERM GOAL #2   Title Pt will perform squat pivot transfer from w/c <> mat table with supervision in improve independence with transfers.    Baseline Can perform with CGA, but overutilizes LLE, needs to pracitce involving RLE    Time 1    Period Weeks    Status On-going      PT SHORT TERM GOAL #3   Title Pt will perform sit <> stands with RW with min guard in order to demo improved functional transfers and balance.    Baseline Inconsistently min a due to LOB. He is impulsive and also only uses L side, causing him to be imbalanced.    Time 1    Period Weeks    Status On-going      PT SHORT TERM GOAL #4   Title Pt will ambulate at least 52' with RW and R hand orthosis with min A x1 therapist in order to improve functional ambulation.    Baseline 40' with hemiwalker, R AFO, CGA-min A. Multipel gait abnormalities, with minimal WB and activation  of RLE.    Time 1    Period Weeks    Status On-going               PT Long Term Goals - 07/14/21 1545       PT LONG TERM GOAL #1   Title Pt will be independent with final HEP with family supervision/assist in order to build upon functional gains made in therapy. ALL LTGS DUE 09/08/21    Time 8    Period Weeks    Status New    Target Date 09/08/21      PT LONG TERM GOAL #2   Title Pt will perform stand pivot transfer from w/c <> mat table with RW and supervision in improve independence with transfers.    Baseline squat pivot with CGA    Time 8    Period Weeks    Status New      PT LONG TERM GOAL #3   Title Pt will perform sit <> stands with RW and supervision in order to demo improved functional mobility/improved transfers.    Time 8    Period Weeks    Status New      PT LONG TERM GOAL #4   Title Pt will undergo gait speed goal when appropriate with goal updated.    Time 8    Period Weeks    Status New      PT LONG TERM GOAL #5   Title Pt will ambulate at least 115' with RW and R hand orthosis with min guard/min A x1 therapist in order to improve functional ambulation.    Time 8    Period Weeks    Status New                   Plan - 08/11/21 1455     Clinical Impression Statement Wayne Mendez arrives today doing well, reports he is having some progress with R UE. Has also been walking more at home. Wife very distressed about having issues with AFO (donning/doffing) due to her own shoulder issues,  I explained to her there is nothing I can do about this and they will need to f/u with the AFO rep for adjustments. Spent session focusing on WB R LE/weight shifting to R LE as well as pregait activities and R LE activation on Nustep and with open chain eccentric quad work. More unsteady during gait than usual- needed Min-ModA for balance today. Will continue to progress as able.    Personal Factors and Comorbidities Comorbidity 1;Past/Current Experience;Time since onset  of injury/illness/exacerbation    Comorbidities HTN    Examination-Activity Limitations Bathing;Locomotion Level;Squat;Stand;Transfers;Hygiene/Grooming;Caring for Others    Examination-Participation Restrictions Driving;Community Activity;Occupation    Stability/Clinical Decision Making Evolving/Moderate complexity    Clinical Decision Making Moderate    Rehab Potential Good    PT Frequency 2x / week    PT Duration 4 weeks    PT Treatment/Interventions ADLs/Self Care Home Management;Aquatic Therapy;Psychologist, educational;Therapeutic activities;Stair training;Gait training;Therapeutic exercise;Balance training;Neuromuscular re-education;Functional mobility training;Orthotic Fit/Training;Patient/family education;Energy conservation;Passive range of motion;Vestibular    PT Next Visit Plan RLE NM re-ed, emphasize RLE WB and activation in all transfer, standing, and gait. Facilitate toe off and forward progression in RLE in gait.    Consulted and Agree with Plan of Care Patient             Patient will benefit from skilled therapeutic intervention in order to improve the following deficits and impairments:  Abnormal gait, Decreased activity tolerance, Decreased coordination, Decreased balance, Decreased cognition, Decreased endurance, Decreased knowledge of precautions, Decreased mobility, Decreased range of motion, Decreased safety awareness, Decreased strength, Difficulty walking, Hypomobility, Impaired tone, Impaired sensation  Visit Diagnosis: Unsteadiness on feet  Other symptoms and signs involving the nervous system  Muscle weakness (generalized)     Problem List Patient Active Problem List   Diagnosis Date Noted   Essential hypertension 08/06/2021   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 [place], [place], [address] Phone: [phone]   Fax:  [phone]  Name: Wayne Mendez MRN: [id number] Date of Birth: 10/06/1965

## 2021-08-12 NOTE — Therapy (Signed)
[facility] [address] [place], [place], [address] Phone: [phone]   Fax:  [phone]  Speech Language Pathology Treatment  Patient Details  Name: Wayne Mendez MRN: [id number] Date of Birth: 1965/12/22 Referring Provider (SLP): Charlton Amor, PA-C   Encounter Date: 08/11/2021   End of Session - 08/12/21 1409     Visit Number 4    Number of Visits 17   requested 10 visits due to reported insurance limit   Date for SLP Re-Evaluation 10/09/21   written for 12 weeks to account for scheduling   Authorization Type UHC - 30 visit limit combined; $30 copay    Authorization - Number of Visits 10    SLP Start Time 1445    SLP Stop Time  1530    SLP Time Calculation (min) 45 min    Activity Tolerance Patient tolerated treatment well   limited by anxiety            Past Medical History:  Diagnosis Date   Hypertension     Past Surgical History:  Procedure Laterality Date   TYMPANOSTOMY TUBE PLACEMENT      There were no vitals filed for this visit.   Subjective Assessment - 08/12/21 1408     Subjective Pt reports he feels like he is beginning to become more "clear".    Currently in Pain? No/denies                   ADULT SLP TREATMENT - 08/12/21 1412       General Information   Behavior/Cognition Alert;Cooperative      Treatment Provided   Treatment provided Cognitive-Linquistic      Cognitive-Linquistic Treatment   Treatment focused on Cognition    Skilled Treatment Completed training on attention strategies to optimize focus during daily tasks and conversations. Pt reported he could benefit from using more active listening skills and writing tasks down. Most strategies are going to be more relevant as pt begins process of return to work. Will cont to monitor. To begin word finding strategies next session.      Assessment / Recommendations / Plan   Plan Continue with current plan of care       Progression Toward Goals   Progression toward goals Progressing toward goals                SLP Short Term Goals - 07/31/21 0840       SLP SHORT TERM GOAL #1   Title Pt will complete CLQT and PROM in first 1-2 ST sessions    Time 6    Period Weeks    Status On-going    Target Date 08/19/21      SLP SHORT TERM GOAL #2   Title Pt will recall 3 memory strategies to increase recall of personally-relevant, important information at home given min A verbal cues    Time 6    Period Weeks    Status On-going    Target Date 08/19/21      SLP SHORT TERM GOAL #3   Title Pt will recall 3 attention strategies for optimization of focus during conversations to decrease cognitive fatigue and increase recall of important information given min A verbal cues    Time 6    Period Weeks    Status On-going    Target Date 08/19/21      SLP SHORT TERM GOAL #4   Title Pt will be 100% intelligible in 10+  minute conversation given occasional min A over 2 sessions    Time 6    Period Weeks    Status On-going    Target Date 08/19/21      SLP SHORT TERM GOAL #5   Title Pt will utilize word finding compensations in 10+ minute conversation given occasional min A over 2 sessions    Time 6    Period Weeks    Status On-going    Target Date 08/19/21              SLP Long Term Goals - 07/31/21 0842       SLP LONG TERM GOAL #1   Title Pt will report successful use of 2+ memory strategies to increase recall of personally-relevant, important information at home.    Time 12    Period Weeks    Status On-going    Target Date 10/02/21      SLP LONG TERM GOAL #2   Title Pt will report successful use of 2+ attention strategies for optimization of focus during conversations to increase recall of verbal/written information.    Time 12    Period Weeks    Status On-going    Target Date 10/02/21      SLP LONG TERM GOAL #3   Title Pt will maintain 100% intelligibility in 30+ minute mod complex  conversation given rare min A    Time 12    Period Weeks    Status On-going    Target Date 10/02/21      SLP LONG TERM GOAL #4   Title Pt will effectively compensate for word finding  in 30+ minute mod complex conversation given rare min A    Time 12    Period Weeks    Status On-going    Target Date 10/02/21              Plan - 08/12/21 1410     Clinical Impression Statement See tx note. Pt asked questions that had been answered last session in depth which indicates some difficulty with memory and/or comprehension. SLP answered questions and encouraged pt to ask as many times as he needs to. Pt reported he feels like he has continued difficulty with word finding. To address next session. Continue with current POC. Skilled ST is warranted to address mild-moderate cognitive lingusitic skills, mild aphasia, and mild dysarthria to optimize return to baseline and increase QOL.    Speech Therapy Frequency 2x / week   requested 1x/week due to insurance visit limitation   Duration 12 weeks   written 12 weeks to account for scheduling   Treatment/Interventions Compensatory strategies;Functional tasks;Patient/family education;Cueing hierarchy;Cognitive reorganization;Multimodal communcation approach;SLP instruction and feedback;Internal/external aids;Compensatory techniques;Language facilitation    Potential to Achieve Goals Good    Consulted and Agree with Plan of Care Patient;Family member/caregiver             Patient will benefit from skilled therapeutic intervention in order to improve the following deficits and impairments:   Cognitive communication deficit    Problem List Patient Active Problem List   Diagnosis Date Noted   Essential hypertension 08/06/2021   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 [place], [place],  [address] Phone: [phone]   Fax:  [phone]   Name: Wayne Mendez MRN: [id number] Date of Birth: [birth year]

## 2021-08-13 ENCOUNTER — Ambulatory Visit: Payer: 59 | Admitting: Occupational Therapy

## 2021-08-13 ENCOUNTER — Encounter: Payer: Self-pay | Admitting: Occupational Therapy

## 2021-08-13 ENCOUNTER — Other Ambulatory Visit: Payer: Self-pay

## 2021-08-13 ENCOUNTER — Encounter: Payer: Self-pay | Admitting: Physical Therapy

## 2021-08-13 ENCOUNTER — Ambulatory Visit: Payer: 59 | Admitting: Physical Therapy

## 2021-08-13 DIAGNOSIS — R278 Other lack of coordination: Secondary | ICD-10-CM

## 2021-08-13 DIAGNOSIS — R208 Other disturbances of skin sensation: Secondary | ICD-10-CM

## 2021-08-13 DIAGNOSIS — R2681 Unsteadiness on feet: Secondary | ICD-10-CM

## 2021-08-13 DIAGNOSIS — M6281 Muscle weakness (generalized): Secondary | ICD-10-CM

## 2021-08-13 DIAGNOSIS — R29818 Other symptoms and signs involving the nervous system: Secondary | ICD-10-CM

## 2021-08-13 DIAGNOSIS — R41840 Attention and concentration deficit: Secondary | ICD-10-CM

## 2021-08-13 DIAGNOSIS — I69151 Hemiplegia and hemiparesis following nontraumatic intracerebral hemorrhage affecting right dominant side: Secondary | ICD-10-CM

## 2021-08-13 NOTE — Therapy (Signed)
[facility] [address] [place], [place], [address] Phone: [phone]   Fax:  [phone]  Occupational Therapy Treatment  Patient Details  Name: Wayne Mendez MRN: [id number] Date of Birth: [birth year] Referring Provider (OT): Mariam Dollar PA   Encounter Date: 08/13/2021   OT End of Session - 08/13/21 1330     Visit Number 8    Number of Visits 10    Date for OT Re-Evaluation 10/06/21   10 visits over 12 weeks   Authorization Type [employer]    Authorization Time Period $30 copay, VL: 30 hard max combined (ST, PT, OT)    Authorization - Number of Visits 10    OT Start Time 1316    OT Stop Time 1358    OT Time Calculation (min) 42 min    Activity Tolerance Patient tolerated treatment well    Behavior During Therapy WFL for tasks assessed/performed            Past Medical History:  Diagnosis Date   Hypertension     Past Surgical History:  Procedure Laterality Date   TYMPANOSTOMY TUBE PLACEMENT      There were no vitals filed for this visit.   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 No/denies             OT Education - 08/13/21 1451     Education Details Continued condition-specific education, particularly regarding NMR, spasticity, and benefit of safely incorporating LUE into as many functional activities as able.    Person(s) Educated Patient    Methods Explanation    Comprehension Verbalized understanding             OT Treatment - 08/13/21    Self-Care: AFO Problem-solved w/ pt to  identify compensatory strategy for donning current AFO w/ increased independence. Pt demonstrated ability to don shoe (w/ orthosis in place) using cross-leg method before placing his foot back on the ground to then pull the dorsal upright extension behind his leg and secure the strap; OT provided assist only w/ initial problem-solving and to ensure pt's foot/toes were comfortably in the shoe    NMR Continued to address gross motor NMR of RUE; spasticity slightly more limiting today w/ OT providing relevant education. To continue to address shoulder flexion for low level reaching, OT attempted weighted towel slides, but pt was unable to complete w/out compensatory shoulder internal rotation despite variety of positioning and support. OT modified task to attempt elbow flexion/shoulder flexion and elbow extension using handle positioned around upright dowel rod. Pt able to achieve fair shoulder/elbow flexion w/ cues and facilitation to prevent compensatory movements; good elbow extension palpated consistently. OT trialed additional methods to achieve elbow extension (isometric contraction against L hand, etc.) which were unsuccessful due to internal rotation bias. Pt completed additional set using dowel rod w/ continued positive results.            OT Short Term Goals - 08/13/21 1348       OT SHORT TERM GOAL #  1   Title Pt will be independent with initial HEP for RUE stretching and NMR    Time 4    Period Weeks    Status Achieved   08/07/21   Target Date 08/11/21      OT SHORT TERM GOAL #2   Title Pt will be verbalize understanding of sesnory strategies for increased safety with sensation impairments with RUE    Time 4    Period Weeks    Status Achieved   08/11/21     OT SHORT TERM GOAL #3   Title Pt will report completing all dressing with set up assistance only    Baseline needing assistance for pulling pants up    Time 4    Period Weeks    Status On-going             OT Long Term  Goals - 07/21/21 1638       OT LONG TERM GOAL #1   Title Pt will be independent with any updated HEPs    Time 12    Period Weeks    Status On-going    Target Date 10/06/21      OT LONG TERM GOAL #2   Title Pt will be independent with any splint wear and care PRN    Baseline currently pre fab resting hand splint for RUE    Time 12    Period Weeks    Status On-going      OT LONG TERM GOAL #3   Title Pt will report taking self to bathroom and complete toileting with mod I    Time 12    Period Weeks    Status On-going      OT LONG TERM GOAL #4   Title Pt will demonstrate active shoulder flexion of 15 degrees or greater in RUE for progressing towards low level reaching    Baseline trace activation    Time 12    Period Weeks    Status On-going      OT LONG TERM GOAL #5   Title Pt will verbalize understanding of work related tasks and skills and return to work recommendations.    Time 12    Period Weeks    Status On-going             Plan - 08/13/21 1451     Clinical Impression Statement Pt continues to be extremely motivated in therapy w/ OT answering pt's questions as able. Due to difficulty reported w/ donning AFO, OT worked w/ pt to identify an alternate method to increase independence w/ positive results. OT then continued to address neuro re-ed of RUE, focusing on shoulder and elbow at this time. Spasticity, particularly of wrist and hand was more limiting this session and will continue to be monitored. Pt also continues to benefit from repetition and cues/facilitation to prevent compensatory movement patterns (typically shoulder internal rotation and/or trunk compensatory movements).    OT Occupational Profile and History Problem Focused Assessment - Including review of records relating to presenting problem    Occupational performance deficits (Please refer to evaluation for details): ADL's;IADL's;Work;Leisure;Rest and Sleep    Body Structure / Function / Physical  Skills ADL;Balance;Coordination;ROM;IADL;Proprioception;UE functional use;Sensation;FMC;Strength;GMC;Tone;Decreased knowledge of use of DME    Cognitive Skills Attention;Problem Solve;Safety Awareness;Sequencing    Rehab Potential Good    Clinical Decision Making Limited treatment options, no task modification necessary    Comorbidities Affecting Occupational Performance: None    Modification or Assistance to Complete Evaluation  No modification of tasks or assist necessary to complete eval    OT Frequency 1x / week    OT Duration 12 weeks   10 visits over 12 weeks   OT Treatment/Interventions Self-care/ADL training;Aquatic Therapy;Electrical Stimulation;Ultrasound;Moist Heat;Fluidtherapy;Therapeutic exercise;Neuromuscular education;Manual Therapy;Patient/family education;Functional Mobility Training;Visual/perceptual remediation/compensation;Passive range of motion;Cognitive remediation/compensation;Therapeutic activities;Splinting;DME and/or AE instruction    Plan Continue w/ RUE NMR (shoulder flex/ext, ext rot; elbow flex/ext; forearm rotation)    OT Home Exercise Plan MedBridge Code: [id number]    Consulted and Agree with Plan of Care Patient;Family member/caregiver    Family Member Consulted Bethann Berkshire (spouse)            Patient will benefit from skilled therapeutic intervention in order to improve the following deficits and impairments:   Body Structure / Function / Physical Skills: ADL, Balance, Coordination, ROM, IADL, Proprioception, UE functional use, Sensation, FMC, Strength, GMC, Tone, Decreased knowledge of use of DME Cognitive Skills: Attention, Problem Solve, Safety Awareness, Sequencing   Visit Diagnosis: 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)  Attention and concentration deficit  Other lack of coordination  Other symptoms and signs involving the nervous system  Other disturbances of skin  sensation  Muscle weakness (generalized)   Problem List Patient Active Problem List   Diagnosis Date Noted   Essential hypertension 08/06/2021   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 [place], [place], [address] Phone: [phone]   Fax:  [phone]  Name: Wayne Mendez MRN: [id number] Date of Birth: 1966-04-02

## 2021-08-13 NOTE — Therapy (Signed)
[facility] [address] [place], [place], [address] Phone: [phone]   Fax:  [phone]  Physical Therapy Treatment  Patient Details  Name: Wayne Mendez MRN: [id number] Date of Birth: [birth year] Referring Provider (PT): Charlton Amor, PA-C   Encounter Date: 08/13/2021   PT End of Session - 08/13/21 1541     Visit Number 6    Number of Visits 11    Date for PT Re-Evaluation 10/12/21    Authorization Type [employer] - $30 copay, VL: 30 hard max combined (ST, PT, OT)    PT Start Time 1405    PT Stop Time 1444    PT Time Calculation (min) 39 min    Activity Tolerance Patient tolerated treatment well    Behavior During Therapy WFL for tasks assessed/performed             Past Medical History:  Diagnosis Date   Hypertension     Past Surgical History:  Procedure Laterality Date   TYMPANOSTOMY TUBE PLACEMENT      There were no vitals filed for this visit.   Subjective Assessment - 08/13/21 1541     Subjective Doing well, working with brace rep and may try another version of AFO from this company    Patient Stated Goals Wants to get out of his w/c, wants to be like he was before.    Currently in Pain? No/denies                               [facility] Adult PT Treatment/Exercise - 08/13/21 0001       Knee/Hip Exercises: Aerobic   Nustep L8 BLEs only x6 minutes, able to push well thorugh R LE      Knee/Hip Exercises: Standing   Gait Training gait 187ft with RW and MinA, WC follow    Other Standing Knee Exercises forward weight shifts over R LE with toe up L LE 1x15 mirror feedback to help prevent kne hyperextension; forward steps with L LE while focusing on keeping R LE in static stance without hyperextending; stance on R LE with side step then march with L LE, min-mod cues to prevent hyperextension      Knee/Hip Exercises: Seated   Other Seated Knee/Hip Exercises sit to stand with 4 inch box  under L LE and ModA from therapist to shift onto R LE/keep goo dkne alignment and prevent hyperextension   x10                    PT Education - 08/13/21 1540     Education Details cues for good form and technique, importance of WB through R LE for tone management and to speed up functional improvement after CVA    Person(s) Educated Patient    Methods Explanation    Comprehension Verbalized understanding              PT Short Term Goals - 08/07/21 1137       PT SHORT TERM GOAL #1   Title Pt will be independent with initial HEP with family supervision/assist in order to build upon functional gains made in therapy. ALL STGS DUE 08/11/21    Baseline Updating    Time 1    Period Weeks    Status On-going    Target Date 08/11/21      PT SHORT TERM GOAL #2   Title Pt will perform squat  pivot transfer from w/c <> mat table with supervision in improve independence with transfers.    Baseline Can perform with CGA, but overutilizes LLE, needs to pracitce involving RLE    Time 1    Period Weeks    Status On-going      PT SHORT TERM GOAL #3   Title Pt will perform sit <> stands with RW with min guard in order to demo improved functional transfers and balance.    Baseline Inconsistently min a due to LOB. He is impulsive and also only uses L side, causing him to be imbalanced.    Time 1    Period Weeks    Status On-going      PT SHORT TERM GOAL #4   Title Pt will ambulate at least 10' with RW and R hand orthosis with min A x1 therapist in order to improve functional ambulation.    Baseline 40' with hemiwalker, R AFO, CGA-min A. Multipel gait abnormalities, with minimal WB and activation of RLE.    Time 1    Period Weeks    Status On-going               PT Long Term Goals - 07/14/21 1545       PT LONG TERM GOAL #1   Title Pt will be independent with final HEP with family supervision/assist in order to build upon functional gains made in therapy. ALL LTGS DUE  09/08/21    Time 8    Period Weeks    Status New    Target Date 09/08/21      PT LONG TERM GOAL #2   Title Pt will perform stand pivot transfer from w/c <> mat table with RW and supervision in improve independence with transfers.    Baseline squat pivot with CGA    Time 8    Period Weeks    Status New      PT LONG TERM GOAL #3   Title Pt will perform sit <> stands with RW and supervision in order to demo improved functional mobility/improved transfers.    Time 8    Period Weeks    Status New      PT LONG TERM GOAL #4   Title Pt will undergo gait speed goal when appropriate with goal updated.    Time 8    Period Weeks    Status New      PT LONG TERM GOAL #5   Title Pt will ambulate at least 115' with RW and R hand orthosis with min guard/min A x1 therapist in order to improve functional ambulation.    Time 8    Period Weeks    Status New                   Plan - 08/13/21 1541     Clinical Impression Statement Tymeer arrives doing well today, was fatigued after last session. We practiced transfers to weaker R side (needed MinA for safety/balance), and continued work on [employer] with focus on pushing with R LE for mm activation and motor control. Otherwise continued working on sitting and standing tasks focusing on improving WB R LE- still needs up to [facility] for effective, intentional WB through right lower extremity. Able to gait train about 128ft today with RW and WC follow, MinA for balance and cues for intentional heel strike and weight shift over R LE; when he fatigues, he does start catching toes, externally rotating leg, and vaulting. Will continue  efforts. Remains very motivated but still needs frequent cues for sequencing and safety.    Personal Factors and Comorbidities Comorbidity 1;Past/Current Experience;Time since onset of injury/illness/exacerbation    Comorbidities HTN    Examination-Activity Limitations Bathing;Locomotion  Level;Squat;Stand;Transfers;Hygiene/Grooming;Caring for Others    Examination-Participation Restrictions Driving;Community Activity;Occupation    Stability/Clinical Decision Making Evolving/Moderate complexity    Clinical Decision Making Moderate    Rehab Potential Good    PT Frequency 2x / week    PT Duration 4 weeks    PT Treatment/Interventions ADLs/Self Care Home Management;Aquatic Therapy;Psychologist, educational;Therapeutic activities;Stair training;Gait training;Therapeutic exercise;Balance training;Neuromuscular re-education;Functional mobility training;Orthotic Fit/Training;Patient/family education;Energy conservation;Passive range of motion;Vestibular    PT Next Visit Plan RLE NM re-ed, emphasize RLE WB and activation in all transfer, standing, and gait. Facilitate toe off and forward progression in RLE in gait.    Consulted and Agree with Plan of Care Patient             Patient will benefit from skilled therapeutic intervention in order to improve the following deficits and impairments:  Abnormal gait, Decreased activity tolerance, Decreased coordination, Decreased balance, Decreased cognition, Decreased endurance, Decreased knowledge of precautions, Decreased mobility, Decreased range of motion, Decreased safety awareness, Decreased strength, Difficulty walking, Hypomobility, Impaired tone, Impaired sensation  Visit Diagnosis: Unsteadiness on feet  Other symptoms and signs involving the nervous system  Muscle weakness (generalized)     Problem List Patient Active Problem List   Diagnosis Date Noted   Essential hypertension 08/06/2021   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 [place], [place], [address] Phone: [phone]   Fax:  [phone]  Name: Kalu Shami MRN: [id number] Date of Birth: 04-17-1966

## 2021-08-17 ENCOUNTER — Telehealth: Payer: Self-pay | Admitting: Occupational Therapy

## 2021-08-17 NOTE — Telephone Encounter (Addendum)
Dr. Dagoberto Ligas,  Dmitri Tso is being seen for occupational, physical, and speech therapy at our Outpatient Rehab location at [facility] s/p L CVA in December 2022. Per his primary PT, Mr. Watring will benefit from use of RLE AFO in order to improve independence and safety with functional mobility.    If you agree, please submit request in EPIC under MD Order, Other Orders (list RLE AFO in comments) or fax to [place] [facility] at [phone].   Thank you, Kathrine Cords, MSOT, OTR/L   [place] [address] [place], [place] [address] Phone: [phone] Fax: [phone]

## 2021-08-18 ENCOUNTER — Ambulatory Visit: Payer: 59 | Admitting: Occupational Therapy

## 2021-08-18 ENCOUNTER — Ambulatory Visit: Payer: 59 | Admitting: Physical Therapy

## 2021-08-18 ENCOUNTER — Other Ambulatory Visit: Payer: Self-pay

## 2021-08-18 ENCOUNTER — Ambulatory Visit (INDEPENDENT_AMBULATORY_CARE_PROVIDER_SITE_OTHER): Payer: 59 | Admitting: Family

## 2021-08-18 ENCOUNTER — Encounter: Payer: Self-pay | Admitting: Family

## 2021-08-18 VITALS — BP 105/66 | HR 79 | Temp 97.9°F | Ht 72.0 in

## 2021-08-18 DIAGNOSIS — F418 Other specified anxiety disorders: Secondary | ICD-10-CM | POA: Diagnosis not present

## 2021-08-18 DIAGNOSIS — I10 Essential (primary) hypertension: Secondary | ICD-10-CM

## 2021-08-18 DIAGNOSIS — I69151 Hemiplegia and hemiparesis following nontraumatic intracerebral hemorrhage affecting right dominant side: Secondary | ICD-10-CM

## 2021-08-18 MED ORDER — METOPROLOL SUCCINATE ER 50 MG PO TB24: 50.0000 mg | ORAL_TABLET | Freq: Every evening | ORAL | 1 refills | Status: DC

## 2021-08-18 MED ORDER — LISINOPRIL 20 MG PO TABS: 20.0000 mg | ORAL_TABLET | Freq: Every evening | ORAL | 1 refills | Status: DC

## 2021-08-18 NOTE — Patient Instructions (Addendum)
It was very nice to see you today!  As discussed, I am switching your Metoprolol to a long acting which you can take just in the evenings.  STOP the Metoprolol Tartrate twice a day. Reduce the Lisinopril to once a day and can be taken in the evening as well. Continue the Amlodipine in the morning, as well as the Lipitor and Amantadine.  Schedule a 3 month follow up visit today.  Keep up the good work in your recovery!    PLEASE NOTE:  If you had any lab tests please let us know if you have not heard back within a few days. You may see your results on MyChart before we have a chance to review them but we will give you a call once they are reviewed by [place]. If we ordered any referrals today, please let us know if you have not heard from their office within the next week.   Please try these tips to maintain a healthy lifestyle:  Eat most of your calories during the day when you are active. Eliminate processed foods including packaged sweets (pies, cakes, cookies), reduce intake of potatoes, white bread, white pasta, and white rice. Look for whole grain options, oat flour or almond flour.  Each meal should contain half fruits/vegetables, one quarter protein, and one quarter carbs (no bigger than a computer mouse).  Cut down on sweet beverages. This includes juice, soda, and sweet tea. Also watch fruit intake, though this is a healthier sweet option, it still contains natural sugar! Limit to 3 servings daily.  Drink at least 1 glass of water with each meal and aim for at least 8 glasses per day  Exercise at least 150 minutes every week.

## 2021-08-18 NOTE — Assessment & Plan Note (Addendum)
New - decided not to start meds, just wants to do therapy - he just got a call to schedule an appointment. both he and wife report an overall improvement in his anxiety, after passage of time and the initial shock of CVA worn off.

## 2021-08-18 NOTE — Assessment & Plan Note (Addendum)
New - Amlodipine, Lisinopril bid, Metoprolol bid. BP running low at home, lowest 99/60. Taking all meds in am and feels tired and speech slurry an hour after taking. Will switch his Metoprolol to 24h in evenings and drop the 2nd Lisinopril.

## 2021-08-18 NOTE — Progress Notes (Signed)
Subjective:     Patient ID: Wayne Mendez, male    DOB: [birth year], 56 y.o.   MRN: [id number]  Chief Complaint  Patient presents with   Anxiety    Pt did not start Lexapro or Hydroxyzine.    HPI: Hypertension: Patient is currently maintained on the following medications for blood pressure: Amlodipine, Metoprolol, Lisinopril Failed meds include: none Patient reports good compliance with blood pressure medications. Patient denies chest pain, headaches, shortness of breath or swelling. Last 3 blood pressure readings in our office are as follows: BP Readings from Last 3 Encounters:  08/18/21 105/66  08/10/21 118/81  07/27/21 124/81  Anxiety/Depression: Patient complains of anxiety disorder.   He has the following symptoms: difficulty concentrating, feelings of losing control, irritable, racing thoughts.  Onset of symptoms was approximately many years ago, but worsened with recent health crisis. He denies current suicidal and homicidal ideation.  Possible organic causes contributing are: recent CVA.  Risk factors: negative life event CVA  Previous treatment includes no medications and no therapy. Reports never starting he Lexapro or Hydroxyzine and just got a call today to schedule his first therapy session. Right sided hemiparesis:  first ever stroke on 06/15/2021 causing slurred speech and aphasia (both improving) and right sided hemiparesis. Right arm was flaccid, now able move a few fingers, right leg/foot with minimal movement. Pt is continuing to work with rehab weekly.  Health Maintenance Due  Topic Date Due   COVID-19 Vaccine (1) Never done   Hepatitis C Screening  Never done   TETANUS/TDAP  Never done   COLONOSCOPY (Pts 45-41yrs Insurance coverage will need to be confirmed)  Never done   Zoster Vaccines- Shingrix (1 of 2) Never done    Past Medical History:  Diagnosis Date   Hypertension     Past Surgical History:  Procedure Laterality Date   TYMPANOSTOMY TUBE  PLACEMENT      Outpatient Medications Prior to Visit  Medication Sig Dispense Refill   amantadine (SYMMETREL) 100 MG capsule Take 1 capsule (100 mg total) by mouth daily. 30 capsule 5   amLODipine (NORVASC) 10 MG tablet Take 1 tablet (10 mg total) by mouth daily. 90 tablet 1   atorvastatin (LIPITOR) 10 MG tablet Take 1 tablet (10 mg total) by mouth daily. 90 tablet 1   Multiple Vitamin (MULTIVITAMIN) tablet Take 1 tablet by mouth daily.     lisinopril (ZESTRIL) 20 MG tablet Take 1 tablet (20 mg total) by mouth 2 (two) times daily. 180 tablet 1   metoprolol tartrate (LOPRESSOR) 50 MG tablet Take 1 tablet (50 mg total) by mouth 2 (two) times daily. 60 tablet 5   No facility-administered medications prior to visit.    No Known Allergies      Objective:    Physical Exam Vitals and nursing note reviewed.  Constitutional:      General: He is not in acute distress.    Appearance: Normal appearance.  HENT:     Head: Normocephalic.  Cardiovascular:     Rate and Rhythm: Normal rate and regular rhythm.  Pulmonary:     Effort: Pulmonary effort is normal.     Breath sounds: Normal breath sounds.  Musculoskeletal:        General: Normal range of motion.     Cervical back: Normal range of motion.  Skin:    General: Skin is warm and dry.  Neurological:     Mental Status: He is alert and oriented to person, place, and time.  Cranial Nerves: Facial asymmetry (right side, improving) present.     Sensory: Sensory deficit (face, right arm and right leg) present.     Motor: Weakness (right arm & leg) present.     Coordination: Coordination abnormal.     Gait: Gait abnormal.  Psychiatric:        Mood and Affect: Mood normal.    BP 105/66    Pulse 79    Temp 97.9 F (36.6 C) (Temporal)    Ht 6' (1.829 m)    SpO2 99%    BMI 26.04 kg/m  Wt Readings from Last 3 Encounters:  08/10/21 192 lb (87.1 kg)  06/15/21 213 lb 6.5 oz (96.8 kg)  06/15/21 216 lb 7.9 oz (98.2 kg)        Assessment & Plan:   Problem List Items Addressed This Visit       Cardiovascular and Mediastinum   Essential hypertension - Primary    New - Amlodipine, Lisinopril bid, Metoprolol bid. BP running low at home, lowest 99/60. Taking all meds in am and feels tired and speech slurry an hour after taking. Will switch his Metoprolol to 24h in evenings and drop the 2nd Lisinopril.       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 pt wife present today, and both report they are not satisified with his progress, albeit slow. They have out of town friends who are ortho and Neuro and are coming by today to discuss what his options are.      Relevant Orders   Ambulatory referral to Physical Medicine Rehab     Other   Situational anxiety    New - decided not to start meds, just wants to do therapy - he just got a call to schedule an appointment. both he and wife report an overall improvement in his anxiety, after passage of time and the initial shock of CVA worn off.       Meds ordered this encounter  Medications   metoprolol succinate (TOPROL-XL) 50 MG 24 hr tablet    Sig: Take 1 tablet (50 mg total) by mouth every evening. Take with or immediately following a meal.    Dispense:  90 tablet    Refill:  1    Order Specific Question:   Supervising Provider    Answer:   ANDY, CAMILLE L [[id number]]   lisinopril (ZESTRIL) 20 MG tablet    Sig: Take 1 tablet (20 mg total) by mouth every evening.    Dispense:  90 tablet    Refill:  1    Order Specific Question:   Supervising  Provider    Answer:   ANDY, CAMILLE L [[id number]]

## 2021-08-19 ENCOUNTER — Encounter: Payer: Self-pay | Admitting: Family

## 2021-08-19 ENCOUNTER — Encounter: Payer: Self-pay | Admitting: Adult Health

## 2021-08-19 ENCOUNTER — Other Ambulatory Visit: Payer: Self-pay

## 2021-08-19 NOTE — Telephone Encounter (Signed)
I will defer this to PMR who also received a message regarding this. I am not exactly sure what type of referral is needed if they have been already connected with [facility] and [facility].  ?

## 2021-08-19 NOTE — Assessment & Plan Note (Signed)
New - pt still working with rehab in strengthening, he is able to move his foot in one direction but not the other, and has some sensation, but unable to move leg independently and same for right arm, unable to move, but able to slightly move his hand. pt wife present today, and both report they are not satisified with his progress, albeit slow. They have out of town friends who are ortho and Neuro and are coming by today to discuss what his options are. ?

## 2021-08-20 ENCOUNTER — Ambulatory Visit: Payer: 59 | Admitting: Physical Therapy

## 2021-08-20 ENCOUNTER — Ambulatory Visit: Payer: 59 | Admitting: Occupational Therapy

## 2021-08-20 NOTE — Telephone Encounter (Signed)
Wayne Mendez,   ?This patient wants to switch his physical rehab over to [facility] from [facility] [facility] and they have provided a fax number & contact name, but I can't bring it up in the system. Can I enter a generic referral?

## 2021-08-21 ENCOUNTER — Telehealth: Payer: Self-pay | Admitting: *Deleted

## 2021-08-21 NOTE — Telephone Encounter (Signed)
Wrote rx and will have clinic fax to you- thanks- ML

## 2021-08-21 NOTE — Telephone Encounter (Signed)
Orders for RLE AFO faxed to [facility] with face sheet and clinic notes. ?

## 2021-08-25 ENCOUNTER — Telehealth: Payer: Self-pay

## 2021-08-25 ENCOUNTER — Ambulatory Visit: Payer: 59 | Attending: Physician Assistant | Admitting: Occupational Therapy

## 2021-08-25 NOTE — Telephone Encounter (Signed)
Please see message and advise 

## 2021-08-25 NOTE — Telephone Encounter (Signed)
error 

## 2021-08-26 NOTE — Telephone Encounter (Signed)
responded to patient, see message, thx

## 2021-08-27 ENCOUNTER — Ambulatory Visit: Payer: 59 | Admitting: Occupational Therapy

## 2021-08-27 ENCOUNTER — Ambulatory Visit: Payer: 59 | Admitting: Physical Therapy

## 2021-09-01 ENCOUNTER — Ambulatory Visit: Payer: 59 | Admitting: Physical Therapy

## 2021-09-01 ENCOUNTER — Ambulatory Visit: Payer: 59 | Admitting: Occupational Therapy

## 2021-09-03 ENCOUNTER — Ambulatory Visit: Payer: 59 | Admitting: Physical Therapy

## 2021-09-03 ENCOUNTER — Ambulatory Visit: Payer: 59 | Admitting: Occupational Therapy

## 2021-09-21 ENCOUNTER — Encounter: Payer: 59 | Attending: Registered Nurse | Admitting: Physical Medicine and Rehabilitation

## 2021-09-21 ENCOUNTER — Other Ambulatory Visit: Payer: Self-pay | Admitting: Family

## 2021-09-21 ENCOUNTER — Encounter: Payer: Self-pay | Admitting: Physical Medicine and Rehabilitation

## 2021-09-21 VITALS — BP 105/65 | HR 83 | Ht 72.0 in | Wt 183.0 lb

## 2021-09-21 DIAGNOSIS — I69151 Hemiplegia and hemiparesis following nontraumatic intracerebral hemorrhage affecting right dominant side: Secondary | ICD-10-CM

## 2021-09-21 DIAGNOSIS — R4701 Aphasia: Secondary | ICD-10-CM | POA: Diagnosis present

## 2021-09-21 DIAGNOSIS — I10 Essential (primary) hypertension: Secondary | ICD-10-CM

## 2021-09-21 DIAGNOSIS — I610 Nontraumatic intracerebral hemorrhage in hemisphere, subcortical: Secondary | ICD-10-CM

## 2021-09-21 MED ORDER — BACLOFEN 10 MG PO TABS: 10.0000 mg | ORAL_TABLET | Freq: Three times a day (TID) | ORAL | 5 refills | Status: DC

## 2021-09-21 NOTE — Progress Notes (Addendum)
? ?Subjective:  ? ? Patient ID: Wayne Mendez, male    DOB: 12/22/1965, 56 y.o.   MRN: [id number] ? ?HPI ?Pt is a 56 yr old male with R hemiplegia and aphasia due to L basal ganglia ICH from uncontrolled HTN in 1/23 ?Here for f/u on stroke and weakness.  ? ? ?Walking with new R AFO ? ?Gets pain in R foot- there most of time-  ?Sounds like muscles tight and fatigued.  ?Most of pain in R foot. ? ?R shoulder-  ?Esp if laughs or strains R shoulder at all, will hurt he thinks in the shoulder joint ?Also gets a lot L flexor muscle pain.  ?Takes Advil or tylenol- works when takes it-  1-2x/week  ? ?Took foam off his side of bed.  ? ?Speech is doing better-  ? ? ?Spasticity- is getting worse- esp in R shoulder- with external rotation.  ?Has subluxation so was with estim on R shoulder.  ?Also has an air at night to keep elbow straight at night.  ?Hand stays a little straighter.  ?Sleeping better as a result of decreased spasticity/pain in RUE.  ? ?Testing out estim R AFO- and traditional AFO-  one he's using external AFO- wondering if it's pushing on his R foot and causing pain.  ? ? ?Is losing weight- thinks he's at a healthier weight weight now- now 183 was 213- is 5 ft 11 inches.  ? ?Has weird feeling- weird sensation on back of head- hard to describe. Wondering muscle issues. Once in a while- feels weird.  ?Also has where eyes don't work together all the time- esp when tired.  ? ?Pain Inventory ?Average Pain 7 ?Pain Right Now 6 ?My pain is constant and aching, numbness ? ?LOCATION OF PAIN  Right foot, right arm ? ?BOWEL ?Number of stools per week: 4-5 ? ?BLADDER ?Normal ? ? ? ?Mobility ?walk without assistance ?walk with assistance ?use a cane ?how many minutes can you walk? 6 minutes ?ability to climb steps?  no ?do you drive?  no ?Do you have any goals in this area?  yes ? ?Function ?disabled: date disabled 05/2021 ?I need assistance with the following:  dressing, bathing, meal prep, household duties, and shopping ?Do you  have any goals in this area?  yes ? ?Neuro/Psych ?numbness ?trouble walking ?confusion ?depression ? ?Prior Studies ?Any changes since last visit?  no ? ?Physicians involved in your care ?Any changes since last visit?  yes, [facility] for  Out Patient Physical Therapy ? ? ?History reviewed. No pertinent family history. ?Social History  ? ?Socioeconomic History  ? Marital status: Married  ?  Spouse name: Not on file  ? Number of children: Not on file  ? Years of education: Not on file  ? Highest education level: Not on file  ?Occupational History  ? Not on file  ?Tobacco Use  ? Smoking status: Never  ? Smokeless tobacco: Never  ?Vaping Use  ? Vaping Use: Never used  ?Substance and Sexual Activity  ? Alcohol use: Yes  ?  Comment: rare  ? Drug use: Not Currently  ? Sexual activity: Not Currently  ?Other Topics Concern  ? Not on file  ?Social History Narrative  ? Not on file  ? ?Social Determinants of Health  ? ?Financial Resource Strain: Not on file  ?Food Insecurity: Not on file  ?Transportation Needs: Not on file  ?Physical Activity: Not on file  ?Stress: Not on file  ?Social Connections: Not on file  ? ?  Past Surgical History:  ?Procedure Laterality Date  ? TYMPANOSTOMY TUBE PLACEMENT    ? ?Past Medical History:  ?Diagnosis Date  ? Hypertension   ? ?Ht 6' (1.829 m)   Wt 183 lb (83 kg)   BMI 24.82 kg/m?  ? ?Opioid Risk Score:   ?Fall Risk Score:  `1 ? ?Depression screen PHQ 2/9 ? ? ?  09/21/2021  ?  9:50 AM 07/23/2021  ?  1:06 PM  ?Depression screen PHQ 2/9  ?Decreased Interest 1 0  ?Down, Depressed, Hopeless 1 0  ?PHQ - 2 Score 2 0  ?Altered sleeping  0  ?Tired, decreased energy  0  ?Change in appetite  0  ?Feeling bad or failure about yourself   0  ?Trouble concentrating  0  ?Moving slowly or fidgety/restless  0  ?Suicidal thoughts  0  ?PHQ-9 Score  0  ? ? ?Review of Systems  ?Constitutional:  Positive for appetite change.  ?     Weight loss  ?Musculoskeletal:  Positive for gait problem.  ?Neurological:  Positive for  weakness and numbness.  ?Psychiatric/Behavioral:    ?     Depression, confusion   ?All other systems reviewed and are negative. ? ?   ?Objective:  ? Physical Exam ? ?Awake, alert, walking with external shoe AFO- on R side; NAD ?Using quad cane ?RUE- MAS of 2 in fingers; 2-3 in R wrist; MAS of 1+ in elbow; and MAS of 2-3 in R shoulder flexion/abduction and 3-4 in external rotation.  ?RLE_ MAS of 1- 1+ in R hip and knee- less in R ankle- no clonus.  ? ?MS: ? ?RUE- Deltoid has shoulder shrug but 1/5 in deltoid; Bicep 1/5; triceps 2/5; WE 1/5; grip 1/5; and FA 0/5 ?RLE_ HF 2-/5; KE 3/5; DF maybe 1/5; PF 2-/5 ? ?2 fingers subluxation of R shoulder ? ?R facial droop- is better, but not resolved.  ? ?   ?Assessment & Plan:  ? ?Pt is a 56 yr old male with R hemiplegia and aphasia due to L basal ganglia ICH from uncontrolled HTN in 1/23 ?Here for f/u on stroke and weakness.  ? ? ?Do Range of motion of R foot multiple times per day- esp if possible resisted flexion and extension  as well as rotation.  Take AFO and do at least 3x/day.  ? ?2.  Take Tylenol or Advil -  up to 5 days/week- esp before therapy or therapeutic exercises.  ? ? ?3. Will have pt seen for Botox of RUE-  ? ?4. Needs to do ROM in R arm- esp shoulder- 6 motions- across chest, abduction, flexion, extension and external and internal rotation.  3+ times per day.  ? ? ?5. Taking Baclofen 5 mg in Am and afternoon and 10 mg at night- will increase Baclofen to 10 mg 3x/day- ? ?6. Amantadine- can try stopping it for first 1 week- if symptoms of word finding OR sleepiness gets worse, go back on it.  ? ?7. Change Baclofen dose OR Amantadine first not at same time. Change Amantadine -  ? ?8.  Weight loss and lack of hunger could be related ot mood- not stroke.  ? ? ?9. Will have pt seen back by Dr Wynn Banker for Botox of RUE- likely needs 300 units- for Shoulder, and hand/wrist mainly.  ? ?10. F/U in 3 months, but needs to see Dr Wynn Banker fro Botulinum toxin asap.   ? ?11. Needs handicapped paperwork for parking. ? ? ?I spent a total of  41  minutes on total care today- >50% coordination of care- due to prolonged visit- as detailed above.  ?  ?

## 2021-09-21 NOTE — Patient Instructions (Signed)
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  ? ? ?Do Range of motion of R foot multiple times per day- esp if possible resisted flexion and extension  as well as rotation.  Take AFO and do at least 3x/day.  ? ?2.  Take Tylenol or Advil -  up to 5 days/week- esp before therapy or therapeutic exercises.  ? ? ?3. Will have pt seen for Botox of RUE-  ? ?4. Needs to do ROM in R arm- esp shoulder- 6 motions- across chest, abduction, flexion, extension and external and internal rotation.  3+ times per day.  ? ? ?5. Taking Baclofen 5 mg in Am and afternoon and 10 mg at night- will increase Baclofen to 10 mg 3x/day- ? ?6. Amantadine- can try stopping it for first 1 week- if symptoms of word finding OR sleepiness gets worse, go back on it.  ? ?7. Change Baclofen dose OR Amantadine first not at same time. Change Amantadine -  ? ?8.  Weight loss and lack of hunger could be related ot mood- not stroke.  ? ? ?9. Will have pt seen back by Dr Letta Pate for Botox of RUE- likely needs 300 units- for Shoulder, and hand/wrist mainly.  ? ?10. F/U in 3 months, but needs to see Dr Letta Pate fro Botulinum toxin asap.  ?

## 2021-09-23 ENCOUNTER — Other Ambulatory Visit: Payer: Self-pay

## 2021-09-23 DIAGNOSIS — I10 Essential (primary) hypertension: Secondary | ICD-10-CM

## 2021-09-23 MED ORDER — METOPROLOL SUCCINATE ER 25 MG PO TB24: 25.0000 mg | ORAL_TABLET | Freq: Every evening | ORAL | 1 refills | Status: DC

## 2021-10-05 ENCOUNTER — Telehealth: Payer: Self-pay | Admitting: Physical Medicine and Rehabilitation

## 2021-10-05 NOTE — Telephone Encounter (Signed)
Have left message on voicemail and texted pt via my chart - thanks- ML ?

## 2021-10-09 ENCOUNTER — Encounter: Payer: Self-pay | Admitting: Family

## 2021-10-09 ENCOUNTER — Ambulatory Visit (INDEPENDENT_AMBULATORY_CARE_PROVIDER_SITE_OTHER): Payer: 59 | Admitting: Family

## 2021-10-09 VITALS — BP 109/74 | HR 73 | Temp 98.4°F | Ht 71.0 in | Wt 181.5 lb

## 2021-10-09 DIAGNOSIS — I10 Essential (primary) hypertension: Secondary | ICD-10-CM | POA: Diagnosis not present

## 2021-10-09 DIAGNOSIS — F418 Other specified anxiety disorders: Secondary | ICD-10-CM | POA: Diagnosis not present

## 2021-10-09 DIAGNOSIS — I69151 Hemiplegia and hemiparesis following nontraumatic intracerebral hemorrhage affecting right dominant side: Secondary | ICD-10-CM

## 2021-10-09 NOTE — Assessment & Plan Note (Addendum)
Unstable - started Lexapro about 7-8 days ago, complaining of lethargy, joint stiffness, could also be r/t hypotension, reducing Lisinopril today. Advised to cut Lexapro in half and see how he does over next week, if side effects are better, increase dose to 10mg  if anxiety is not better, or stay on 5mg . f/u in 46m. ? ? ?

## 2021-10-09 NOTE — Patient Instructions (Addendum)
It was very nice to see you today! ? ?Try 1/2 of the Lexapro daily or at bedtime for the next week and see if the new side effects you have been having go away. If they do, stay on this dose for another 2 weeks, and see if your anxiety symptoms are better. If yes, then stay on this dose. If no, increase to 1 full pill daily. ?If symptoms don't go away with reducing to 1/2 pill, send me a message and we can try something else. ? ?Continue the 1/2 pill of Metoprolol at bedtime. Continue the Lisinopril 10mg  (1/2 of 20mg ) daily. ? ? ? ? ?PLEASE NOTE: ? ?If you had any lab tests please let know if you have not heard back within a few days. You may see your results on MyChart before we have a chance to review them but we will give you a call once they are reviewed by . If we ordered any referrals today, please let us know if you have not heard from their office within the next week.  ? ?

## 2021-10-09 NOTE — Progress Notes (Signed)
? ?Subjective:  ? ? ? Patient ID: Wayne Mendez, male    DOB: 06/07/1966, 56 y.o.   MRN: [id number] ? ?Chief Complaint  ?Patient presents with  ? Hypertension  ? Depression  ?  Discuss medication  ? ?HPI: ?Hypertension: Patient is currently maintained on the following medications for blood pressure: Lisinopril, Metoprolol ?Failed meds include: none ?Patient reports good compliance with blood pressure medications. ?Patient denies chest pain, headaches, shortness of breath or swelling. ?Last 3 blood pressure readings in our office are as follows: ?BP Readings from Last 3 Encounters:  ?10/09/21 109/74  ?09/21/21 105/65  ?08/18/21 105/66  ?Anxiety/Depression: Patient complains of anxiety disorder.   ?He has the following symptoms: difficulty concentrating, feelings of losing control, irritable, racing thoughts.  ?Onset of symptoms was approximately many years ago, but worsened with recent health crisis. He denies current suicidal and homicidal ideation.  ?Possible organic causes contributing are: recent CVA.  ?Risk factors: negative life event CVA  ?Previous treatment includes no medications and no therapy. Reports never starting he Lexapro or Hydroxyzine and just got a call today to schedule his first therapy session. ?Right sided hemiparesis:  first ever stroke on 06/15/2021 causing slurred speech and aphasia (both improving) and right sided hemiparesis. Right arm was flaccid, now able move a few fingers, right leg/foot with minimal movement. Pt is continuing to work with rehab weekly.Has had muscle spasms in back, neck, and toe curling causing pain. ? ?Health Maintenance Due  ?Topic Date Due  ? COVID-19 Vaccine (1) Never done  ? Hepatitis C Screening  Never done  ? TETANUS/TDAP  Never done  ? COLONOSCOPY (Pts 45-97yrs Insurance coverage will need to be confirmed)  Never done  ? Zoster Vaccines- Shingrix (1 of 2) Never done  ? ? ?Past Medical History:  ?Diagnosis Date  ? Hypertension   ? ? ?Past Surgical History:   ?Procedure Laterality Date  ? TYMPANOSTOMY TUBE PLACEMENT    ? ? ?Outpatient Medications Prior to Visit  ?Medication Sig Dispense Refill  ? amantadine (SYMMETREL) 100 MG capsule Take 1 capsule (100 mg total) by mouth daily. 30 capsule 5  ? atorvastatin (LIPITOR) 10 MG tablet Take 1 tablet (10 mg total) by mouth daily. 90 tablet 1  ? baclofen (LIORESAL) 10 MG tablet Take 1 tablet (10 mg total) by mouth 3 (three) times daily. 90 each 5  ? escitalopram (LEXAPRO) 10 MG tablet PLEASE SEE ATTACHED FOR DETAILED DIRECTIONS    ? Multiple Vitamin (MULTIVITAMIN) tablet Take 1 tablet by mouth daily.    ? lisinopril (ZESTRIL) 20 MG tablet Take 1 tablet (20 mg total) by mouth every evening. 90 tablet 1  ? metoprolol succinate (TOPROL-XL) 25 MG 24 hr tablet Take 1 tablet (25 mg total) by mouth every evening. Take with or immediately following a meal. 90 tablet 1  ? ?No facility-administered medications prior to visit.  ? ? ?No Known Allergies ? ? ?   ?Objective:  ?  ?Physical Exam ?Vitals and nursing note reviewed.  ?Constitutional:   ?   General: He is not in acute distress. ?   Appearance: Normal appearance.  ?HENT:  ?   Head: Normocephalic.  ?Cardiovascular:  ?   Rate and Rhythm: Normal rate and regular rhythm.  ?Pulmonary:  ?   Effort: Pulmonary effort is normal.  ?   Breath sounds: Normal breath sounds.  ?Musculoskeletal:     ?   General: Normal range of motion.  ?   Cervical back: Normal range of motion.  ?  Comments: decreased ROM right leg, wearing brace ?right arm flaccid  ?Skin: ?   General: Skin is warm and dry.  ?Neurological:  ?   Mental Status: He is alert and oriented to person, place, and time.  ?Psychiatric:     ?   Mood and Affect: Mood normal.  ? ? ?BP 109/74 (BP Location: Left Arm, Patient Position: Sitting, Cuff Size: Large)   Pulse 73   Temp 98.4 ?F (36.9 ?C) (Temporal)   Ht 5\' 11"  (1.803 m)   Wt 181 lb 8 oz (82.3 kg)   SpO2 99%   BMI 25.31 kg/m?  ?Wt Readings from Last 3 Encounters:  ?10/09/21 181 lb  8 oz (82.3 kg)  ?09/21/21 183 lb (83 kg)  ?08/10/21 192 lb (87.1 kg)  ? ?   ?Assessment & Plan:  ? ?Problem List Items Addressed This Visit   ? ?  ? Cardiovascular and Mediastinum  ? Essential hypertension  ?  Unstable - per MyChart messaging, pt BP and HR still running low even with working with PT. Had advised to decrease Metoprolol to 1/2 qhs. HR wnl, but BP still low at home. Advised cutting Lisinopril in half, sending 10mg  to pharmacy. f/u in 8m. ? ?  ?  ? Relevant Medications  ? metoprolol succinate (TOPROL-XL) 25 MG 24 hr tablet  ? lisinopril (ZESTRIL) 10 MG tablet  ?  ? Nervous and Auditory  ? 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  pt received stem stimulator and has sensation in right arm now, but still not much movement. right let brace on and pt has increased strength and movement and no longer in wheelchair, walking with a cane. didn't do well off Amantadine, speech problems & foggy headedness returned so had to restart same dose. ? ?  ?  ?  ? Other  ? Situational anxiety - Primary  ?  Unstable - started Lexapro about 7-8 days ago, complaining of lethargy, joint stiffness, could also be r/t hypotension, reducing Lisinopril today. Advised to cut Lexapro in half and see how he does over next week, if side effects are better, increase dose to 10mg  if anxiety is not better, or stay on 5mg . f/u in 71m. ? ? ? ?  ?  ? Relevant Medications  ? escitalopram (LEXAPRO) 10 MG tablet  ? ? ?Meds ordered this encounter  ?Medications  ? metoprolol succinate (TOPROL-XL) 25 MG 24 hr tablet  ?  Sig: Take 0.5 tablets (12.5 mg total) by mouth every evening. Take with or immediately following a meal.  ?  Dispense:  45 tablet  ?  Refill:  1  ?  Order Specific Question:   Supervising Provider  ?  Answer:   ANDY, CAMILLE L [[id number]]  ? lisinopril (ZESTRIL) 10 MG tablet  ?  Sig: Take 1 tablet (10 mg total) by mouth daily.  ?  Dispense:  90 tablet  ?  Refill:  1  ?  Order Specific  Question:   Supervising Provider  ?  Answer:   ANDY, CAMILLE L [[id number]]  ? ? ?1m, NP ? ?

## 2021-10-11 MED ORDER — LISINOPRIL 10 MG PO TABS: 10.0000 mg | ORAL_TABLET | Freq: Every day | ORAL | 1 refills | Status: AC

## 2021-10-11 MED ORDER — METOPROLOL SUCCINATE ER 25 MG PO TB24: 12.5000 mg | ORAL_TABLET | Freq: Every evening | ORAL | 1 refills | Status: AC

## 2021-10-11 NOTE — Assessment & Plan Note (Addendum)
pt received stem stimulator and has sensation in right arm now, but still not much movement. right let brace on and pt has increased strength and movement and no longer in wheelchair, walking with a cane. didn't do well off Amantadine, speech problems & foggy headedness returned so had to restart same dose. ?

## 2021-10-11 NOTE — Assessment & Plan Note (Addendum)
Unstable - per MyChart messaging, pt BP and HR still running low even with working with PT. Had advised to decrease Metoprolol to 1/2 qhs. HR wnl, but BP still low at home. Advised cutting Lisinopril in half, sending 10mg  to pharmacy. f/u in 88m. ?

## 2021-11-01 ENCOUNTER — Encounter: Payer: Self-pay | Admitting: Physical Medicine and Rehabilitation

## 2021-11-10 ENCOUNTER — Encounter: Payer: Self-pay | Admitting: Physical Medicine & Rehabilitation

## 2021-11-10 ENCOUNTER — Encounter: Payer: 59 | Attending: Registered Nurse | Admitting: Physical Medicine & Rehabilitation

## 2021-11-10 VITALS — BP 128/79 | HR 71 | Temp 98.0°F | Ht 71.0 in | Wt 180.4 lb

## 2021-11-10 DIAGNOSIS — G8111 Spastic hemiplegia affecting right dominant side: Secondary | ICD-10-CM | POA: Insufficient documentation

## 2021-11-10 NOTE — Progress Notes (Signed)
Botox Injection for spasticity using needle EMG guidance  Dilution: 50 Units/ml Indication: Severe spasticity which interferes with ADL,mobility and/or  hygiene and is unresponsive to medication management and other conservative care Informed consent was obtained after describing risks and benefits of the procedure with the patient. This includes bleeding, bruising, infection, excessive weakness, or medication side effects. A REMS form is on file and signed. Needle: 25g 2" for LE, 27g 1" for UE needle electrode Number of units per muscle  Biceps50 FCR50 FCU0 FDS50 FDP50 FPL25 Gastrosoleus50 FDL 25  If inadequate response noted at follow up would recommend increasing dose to 400U All injections were done after obtaining appropriate EMG activity and after negative drawback for blood. The patient tolerated the procedure well. Post procedure instructions were given. A followup appointment was made.

## 2021-11-10 NOTE — Patient Instructions (Signed)
You received a Botox injection today. You may experience soreness at the needle injection sites. Please call us if any of the injection sites turns red after a couple days or if there is any drainage. You may experience muscle weakness as a result of Botox. This would improve with time but can take several weeks to improve. The Botox should start working in about one week. The Botox usually last 3 months. The injection can be repeated every 3 months as needed. Biceps50 FCR50 FCU0 FDS50 FDP50 FPL25 Gastrosoleus50 FDL 25  Did not inject R quad due to weakness

## 2021-11-12 ENCOUNTER — Telehealth: Payer: Self-pay

## 2021-11-12 NOTE — Telephone Encounter (Signed)
Joline Salt, Lawyer for Mr. Tetrault  called:   Rosann Auerbach will be faxing over [employer] forms. To be completed by you. Rosann Auerbach has requested to speak to you, before you fill out the forms. Per caller she has "some detailed information" to discuss.   Call back phone [phone].

## 2021-11-17 ENCOUNTER — Encounter: Payer: Self-pay | Admitting: Family

## 2021-11-17 ENCOUNTER — Ambulatory Visit (INDEPENDENT_AMBULATORY_CARE_PROVIDER_SITE_OTHER): Payer: 59 | Admitting: Family

## 2021-11-17 VITALS — BP 109/72 | HR 63 | Temp 98.4°F | Ht 71.0 in | Wt 180.2 lb

## 2021-11-17 DIAGNOSIS — Z8673 Personal history of transient ischemic attack (TIA), and cerebral infarction without residual deficits: Secondary | ICD-10-CM | POA: Diagnosis not present

## 2021-11-17 DIAGNOSIS — I10 Essential (primary) hypertension: Secondary | ICD-10-CM

## 2021-11-17 LAB — COMPREHENSIVE METABOLIC PANEL
ALT: 24 U/L (ref 0–53)
AST: 19 U/L (ref 0–37)
Albumin: 4.4 g/dL (ref 3.5–5.2)
Alkaline Phosphatase: 64 U/L (ref 39–117)
BUN: 17 mg/dL (ref 6–23)
CO2: 32 mEq/L (ref 19–32)
Calcium: 9.6 mg/dL (ref 8.4–10.5)
Chloride: 101 mEq/L (ref 96–112)
Creatinine, Ser: 1.34 mg/dL (ref 0.40–1.50)
GFR: 60.11 mL/min (ref >60.00)
Glucose, Bld: 89 mg/dL (ref 70–99)
Potassium: 4.4 mEq/L (ref 3.5–5.1)
Sodium: 139 mEq/L (ref 135–145)
Total Bilirubin: 0.7 mg/dL (ref 0.2–1.2)
Total Protein: 7 g/dL (ref 6.0–8.3)

## 2021-11-17 LAB — LIPID PANEL
Cholesterol: 151 mg/dL (ref 0–200)
HDL: 33.8 mg/dL — ABNORMAL LOW (ref >39.00)
LDL Cholesterol: 102 mg/dL — ABNORMAL HIGH (ref 0–99)
NonHDL: 117.36
Total CHOL/HDL Ratio: 4
Triglycerides: 76 mg/dL (ref 0.0–149.0)
VLDL: 15.2 mg/dL (ref 0.0–40.0)

## 2021-11-17 MED ORDER — AMLODIPINE BESYLATE 10 MG PO TABS: 10.0000 mg | ORAL_TABLET | Freq: Every day | ORAL | 1 refills | Status: AC

## 2021-11-17 NOTE — Assessment & Plan Note (Signed)
No labs since Dec., rechecking lipids, pt taking Lipitor qd. Wife states pt work giving them a hard time with FMLA. Rehab doc, Lovorn completed papers but they are asking if I can help and back up Lovrn's notes. She is going to ask their lawyer if there is a specific form for me to complete or another FMLA.

## 2021-11-17 NOTE — Patient Instructions (Signed)
It was very nice to see you today!  Go to the lab for blood work today.  Continue same medications for your blood pressure.   OK to move the Amlodipine to evenings if preferred or feeling more tired during the day.  Also ok to cut this pill in half if continuing to see BP readings low - top # < 110 or bottom # <60.  Follow up in 3 months.    PLEASE NOTE:  If you had any lab tests please let us know if you have not heard back within a few days. You may see your results on MyChart before we have a chance to review them but we will give you a call once they are reviewed by [place]. If we ordered any referrals today, please let us know if you have not heard from their office within the next week.

## 2021-11-17 NOTE — Assessment & Plan Note (Signed)
Chronic - BP good here & at home. Continue Lisinopril 10mg  qam, Amlodipine 10mg  qpm, Metoprolol 12.5mg  qpm. f/u 3 mos.

## 2021-11-17 NOTE — Progress Notes (Signed)
Subjective:     Patient ID: Wayne Mendez, male    DOB: 11-02-1965, 56 y.o.   MRN: [id number]  Chief Complaint  Patient presents with   Hypertension    Pt wife states his BP and HR has been good at home.    HPI: Hypertension: Patient is currently maintained on the following medications for blood pressure: Amlodipine, Lisinopril, Metoprolol Failed meds include: Metoprolol Tartrate Patient reports good compliance with blood pressure medications. Patient denies chest pain, headaches, shortness of breath or swelling. Last 3 blood pressure readings in our office are as follows: BP Readings from Last 3 Encounters:  11/17/21 109/72  11/10/21 128/79  10/09/21 109/74  Right sided hemiparesis:  first ever stroke on 06/15/2021 causing slurred speech and aphasia (both improving) and right sided hemiparesis. Right arm was flaccid, now able move a few fingers, right leg/foot with minimal movement. Pt is continuing to work with rehab weekly.Has had muscle spasms in back, neck, and toe curling causing pain, but Baclofen helping.   Assessment & Plan:   Problem List Items Addressed This Visit       Cardiovascular and Mediastinum   Essential hypertension - Primary    Chronic - BP good here & at home. Continue Lisinopril 577m qam, Amlodipine 160mqpm, Metoprolol 12.77m53mpm. f/u 3 mos.       Relevant Medications   amLODipine (NORVASC) 10 MG tablet   Other Relevant Orders   Lipid panel (Completed)   Comp Met (CMET) (Completed)     Other   Status post CVA    No labs since Dec., rechecking lipids, pt taking Lipitor qd. Wife states pt work giving them a hard time with FMLA. Rehab doc, Lovorn completed papers but they are asking if I can help and back up Lovrn's notes. She is going to ask their lawyer if there is a specific form for me to complete or another FMLA.        Relevant Orders   Lipid panel (Completed)   Comp Met (CMET) (Completed)    Outpatient Medications Prior to Visit   Medication Sig Dispense Refill   amantadine (SYMMETREL) 100 MG capsule Take 1 capsule (100 mg total) by mouth daily. 30 capsule 5   atorvastatin (LIPITOR) 10 MG tablet Take 1 tablet (10 mg total) by mouth daily. 90 tablet 1   baclofen (LIORESAL) 10 MG tablet Take 1 tablet (10 mg total) by mouth 3 (three) times daily. 90 each 5   escitalopram (LEXAPRO) 10 MG tablet PLEASE SEE ATTACHED FOR DETAILED DIRECTIONS     lisinopril (ZESTRIL) 10 MG tablet Take 1 tablet (10 mg total) by mouth daily. 90 tablet 1   metoprolol succinate (TOPROL-XL) 25 MG 24 hr tablet Take 0.5 tablets (12.5 mg total) by mouth every evening. Take with or immediately following a meal. 45 tablet 1   Multiple Vitamin (MULTIVITAMIN) tablet Take 1 tablet by mouth daily.     amLODipine (NORVASC) 10 MG tablet Take 10 mg by mouth daily.     lisinopril (ZESTRIL) 20 MG tablet Take 20 mg by mouth 2 (two) times daily.     metoprolol tartrate (LOPRESSOR) 50 MG tablet Take 50 mg by mouth 2 (two) times daily.     No facility-administered medications prior to visit.    Past Medical History:  Diagnosis Date   Hypertension     Past Surgical History:  Procedure Laterality Date   TYMPANOSTOMY TUBE PLACEMENT      No Known Allergies  Objective:    Physical Exam Vitals and nursing note reviewed.  Constitutional:      General: He is not in acute distress.    Appearance: Normal appearance.  HENT:     Head: Normocephalic.  Cardiovascular:     Rate and Rhythm: Normal rate and regular rhythm.  Pulmonary:     Effort: Pulmonary effort is normal.     Breath sounds: Normal breath sounds.  Musculoskeletal:        General: Normal range of motion.     Cervical back: Normal range of motion.  Skin:    General: Skin is warm and dry.  Neurological:     Mental Status: He is alert and oriented to person, place, and time.     Sensory: Sensation is intact.     Motor: Weakness (decreased ROM right leg, wearing brace) present.      Coordination: Coordination abnormal (due to hemiparesis).     Gait: Gait abnormal (uses cane).  Psychiatric:        Mood and Affect: Mood normal.    BP 109/72 (BP Location: Left Arm, Patient Position: Sitting, Cuff Size: Large)   Pulse 63   Temp 98.4 F (36.9 C) (Temporal)   Ht 5' 11"  (1.803 m)   Wt 180 lb 4 oz (81.8 kg)   SpO2 99%   BMI 25.14 kg/m  Wt Readings from Last 3 Encounters:  11/17/21 180 lb 4 oz (81.8 kg)  11/10/21 180 lb 6.4 oz (81.8 kg)  10/09/21 181 lb 8 oz (82.3 kg)     Jeanie Sewer, NP

## 2021-11-19 NOTE — Progress Notes (Signed)
Hi Lavonte,  Your electrolytes, kidney, liver function are in normal range. Your cholesterol numbers are also all within range. Continue the same dose of Lipitor.  Take care.

## 2021-12-30 ENCOUNTER — Encounter: Payer: 59 | Attending: Registered Nurse | Admitting: Physical Medicine and Rehabilitation

## 2021-12-30 ENCOUNTER — Encounter: Payer: Self-pay | Admitting: Physical Medicine and Rehabilitation

## 2021-12-30 VITALS — BP 112/73 | HR 78 | Ht 71.0 in | Wt 185.8 lb

## 2021-12-30 DIAGNOSIS — R4701 Aphasia: Secondary | ICD-10-CM | POA: Insufficient documentation

## 2021-12-30 DIAGNOSIS — I610 Nontraumatic intracerebral hemorrhage in hemisphere, subcortical: Secondary | ICD-10-CM | POA: Insufficient documentation

## 2021-12-30 DIAGNOSIS — R252 Cramp and spasm: Secondary | ICD-10-CM | POA: Insufficient documentation

## 2021-12-30 DIAGNOSIS — I69151 Hemiplegia and hemiparesis following nontraumatic intracerebral hemorrhage affecting right dominant side: Secondary | ICD-10-CM | POA: Diagnosis not present

## 2021-12-30 MED ORDER — DANTROLENE SODIUM 50 MG PO CAPS: 50.0000 mg | ORAL_CAPSULE | Freq: Four times a day (QID) | ORAL | 5 refills | Status: AC

## 2021-12-30 NOTE — Progress Notes (Signed)
Subjective:    Patient ID: Wayne Mendez, male    DOB: [birth year], 56 y.o.   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 With associated spasticity-  Here for f/u on stroke.   Feeling a little better-  Botox has helped in the R arm  Usually doing estim 10 hours/day usually- has seen some improvement- mainly spasticity in RUE, but bad in R leg.   Feels like walking with PEG leg.   Feels pressure on R knee down- cannot describe it really well, but painful- moderate pain- but really bothers him at end of day.  Mainly in R foot and calf.  Sensation is coming back. Feeling sensation/pain more in R leg as a result.   Still on Amantadine- since went downwards fast after   Coughs a lot when when he tries to eat or drink- random- worse with being tired- worst on chewing peanut butter.  Sitting upright when this occurs.  Also occurs with tea- lasted 1 hour of coughing last night for example.   Got Botox-  11/10/21- by Dr Wynn Banker- 300 units Biceps50 FCR50 FCU0 FDS50 FDP50 FPL25 Gastrosoleus50 FDL 25 Pain Inventory Average Pain 5 Pain Right Now 0 My pain is dull  LOCATION OF PAIN  leg  BOWEL Number of stools per week: 7 Oral laxative use No  Type of laxative . Enema or suppository use No  History of colostomy No  Incontinent No   BLADDER Normal In and out cath, frequency . Able to self cath  . Bladder incontinence No  Frequent urination No  Leakage with coughing No  Difficulty starting stream No  Incomplete bladder emptying No    Mobility walk without assistance ability to climb steps?  yes do you drive?  no  Function not employed: date last employed 5/1 disabled: date disabled 06/15/21 I need assistance with the following:  bathing, toileting, meal prep, household duties, and shopping  Neuro/Psych weakness numbness trouble  walking  Prior Studies Any changes since last visit?  no  Physicians involved in your care Any changes since last visit?  no   No family history on file. Social History   Socioeconomic History   Marital status: Married    Spouse name: Not on file   Number of children: Not on file   Years of education: Not on file   Highest education level: Not on file  Occupational History   Not on file  Tobacco Use   Smoking status: Never   Smokeless tobacco: Never  Vaping Use   Vaping Use: Never used  Substance and Sexual Activity   Alcohol use: Yes    Comment: rare   Drug use: Not Currently   Sexual activity: Not Currently  Other Topics Concern   Not on file  Social History Narrative   Not on file   Social Determinants of Health   Financial Resource Strain: Not on file  Food Insecurity: Not on file  Transportation Needs: Not on file  Physical Activity: Not on file  Stress: Not on file  Social Connections: Not on file   Past Surgical History:  Procedure Laterality Date   TYMPANOSTOMY TUBE PLACEMENT     Past Medical History:  Diagnosis Date   Hypertension    BP 112/73   Pulse 78   Ht 5\' 11"  (1.803 m)   Wt  185 lb 12.8 oz (84.3 kg)   SpO2 97%   BMI 25.91 kg/m   Opioid Risk Score:   Fall Risk Score:  `1  Depression screen [facility] 2/9     12/30/2021   11:33 AM 11/17/2021   11:15 AM 11/10/2021   10:02 AM 09/21/2021    9:50 AM 07/23/2021    1:06 PM  Depression screen PHQ 2/9  Decreased Interest 0 0 0 1 0  Down, Depressed, Hopeless 0 0 0 1 0  PHQ - 2 Score 0 0 0 2 0  Altered sleeping  0   0  Tired, decreased energy  0   0  Change in appetite  0   0  Feeling bad or failure about yourself   0   0  Trouble concentrating  0   0  Moving slowly or fidgety/restless  0   0  Suicidal thoughts  0   0  PHQ-9 Score  0   0  Difficult doing work/chores  Not difficult at all        Review of Systems  Respiratory:  Positive for cough.   Musculoskeletal:  Positive for gait  problem.  Neurological:  Positive for weakness and numbness.  All other systems reviewed and are negative.     Objective:   Physical Exam  Awake, alert, appropriate, moderate apahsia, but social interaction MUCH better; accompanied by wife; wearing R AFO- NAD Neuro: MAS of 3 in R shoulder, MAS of 4 at elbow in spite of Botox;  MAS of 2 at wrist and 3 in fingers RLE- MAS of 2 statically in R hip/R knee and R ankle   Gait- "peg" leg- spasticity kicks in with gait- cannot easily move R hip forward and literally cannot flex knee with gait/weight bearing.   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 F/U of spasticity/Stroke.   Continue Amantadine for 1 year, then retry coming off it. Educated might need long term, but will see. Since has reduction in level of function/speech off it.   2.   No peanut butter at this time. But that's not the only cause.   3. Coughing is due to protection of airway. Residual cough for 1 hour- need Speech therapy for swallowing- at [facility], so can send me a referral/order- I wrote a prescription and gave to wife.   4. For spasticity- Dantrolene 50 mg nightly x 1 week, then 50 mg 2x/day x 1 week, then 50 mg 3x/day x 1 week, then 100 mg 2x/day- for spasticity- due to BP issues, cannot use Zanaflex- and already has sedation with Baclofen, so cannot increase dose.  Side effects can be sedation- and looser stools and increase LFTs/liver function tests. Can make feel weaker as titrate up- because spasticity masquerades as strength.   5. Continue the baclofen- along with Dantrolene.   6. Discuss ITB/baclofen pump- went over ITB trial and pump- but aren't there yet, but will see.    7. Spasticity gets worse  for up to 2 years -then plateau's so our goal to get there.    8. Need to treat allergies!- Zyrtec or Allegra-   9. F/U -  3 months- call me if spasticity "starts getting stuck"- so would need to call me to look at ITB pump  10. Needs f/u with Dr Wynn Banker in late August - 400 units Botox   I spent a total of  42  minutes on total care today- >50% coordination of care- due to education on spasticity, botox and ITB pump which is possible. As well as swallowing issues

## 2021-12-30 NOTE — Patient Instructions (Addendum)
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 F/U of spasticity/Stroke.   Continue Amantadine for 1 year, then retry coming off it. Educated might need long term, but will see. Since has reduction in level of function/speech off it.   2.   No peanut butter at this time. But that's not the only cause.   3. Coughing is due to protection of airway. Residual cough for 1 hour- need Speech therapy for swallowing- at [facility], so can send me a referral/order- I wrote a prescription and gave to wife.   4. For spasticity- Dantrolene 50 mg nightly x 1 week, then 50 mg 2x/day x 1 week, then 50 mg 3x/day x 1 week, then 100 mg 2x/day- for spasticity- due to BP issues, cannot use Zanaflex- and already has sedation with Baclofen, so cannot increase dose.  Side effects can be sedation- and looser stools and increase LFTs/liver function tests. Can make feel weaker as titrate up- because spasticity masquerades as strength.   5. Continue the baclofen- along with Dantrolene.   6. Discuss ITB/baclofen pump- went over ITB trial and pump- but aren't there yet, but will see.    7. Spasticity gets worse for up to 2 years -then plateau's so our goal to get there.    8. Need to treat allergies!- Zyrtec or Allegra-   9. F/U - 3 months- call me if spasticity "starts getting stuck"- so would need to call me to look at ITB pump  10. F/U with Dr Wynn Banker after 8/23 for 400 units Botox

## 2022-01-05 ENCOUNTER — Other Ambulatory Visit: Payer: Self-pay | Admitting: Physical Medicine and Rehabilitation

## 2022-01-19 ENCOUNTER — Encounter: Payer: Self-pay | Admitting: Physical Medicine and Rehabilitation

## 2022-01-19 ENCOUNTER — Encounter: Payer: Self-pay | Admitting: Family

## 2022-01-22 ENCOUNTER — Other Ambulatory Visit: Payer: Self-pay | Admitting: Family

## 2022-01-22 DIAGNOSIS — I69151 Hemiplegia and hemiparesis following nontraumatic intracerebral hemorrhage affecting right dominant side: Secondary | ICD-10-CM

## 2022-01-25 ENCOUNTER — Other Ambulatory Visit: Payer: Self-pay

## 2022-01-25 ENCOUNTER — Other Ambulatory Visit: Payer: Self-pay | Admitting: Family

## 2022-01-25 MED ORDER — ESCITALOPRAM OXALATE 10 MG PO TABS: ORAL_TABLET | ORAL | 0 refills | Status: DC

## 2022-02-06 ENCOUNTER — Encounter: Payer: Self-pay | Admitting: Family

## 2022-02-06 DIAGNOSIS — I69151 Hemiplegia and hemiparesis following nontraumatic intracerebral hemorrhage affecting right dominant side: Secondary | ICD-10-CM

## 2022-02-08 NOTE — Telephone Encounter (Signed)
Yes, and let pt know, thank you

## 2022-02-16 ENCOUNTER — Encounter: Payer: 59 | Attending: Registered Nurse | Admitting: Physical Medicine & Rehabilitation

## 2022-02-16 DIAGNOSIS — I610 Nontraumatic intracerebral hemorrhage in hemisphere, subcortical: Secondary | ICD-10-CM | POA: Insufficient documentation

## 2022-02-16 DIAGNOSIS — I69151 Hemiplegia and hemiparesis following nontraumatic intracerebral hemorrhage affecting right dominant side: Secondary | ICD-10-CM | POA: Insufficient documentation

## 2022-02-16 DIAGNOSIS — R252 Cramp and spasm: Secondary | ICD-10-CM | POA: Insufficient documentation

## 2022-02-16 DIAGNOSIS — R4701 Aphasia: Secondary | ICD-10-CM | POA: Insufficient documentation

## 2022-02-17 ENCOUNTER — Ambulatory Visit: Payer: 59 | Admitting: Family

## 2022-02-18 ENCOUNTER — Other Ambulatory Visit: Payer: Self-pay | Admitting: Family

## 2022-02-26 ENCOUNTER — Other Ambulatory Visit: Payer: Self-pay | Admitting: Family

## 2022-02-28 ENCOUNTER — Other Ambulatory Visit: Payer: Self-pay | Admitting: Family

## 2022-03-15 ENCOUNTER — Encounter: Payer: Self-pay | Admitting: *Deleted

## 2022-04-24 ENCOUNTER — Other Ambulatory Visit: Payer: Self-pay | Admitting: Physical Medicine and Rehabilitation

## 2022-04-28 ENCOUNTER — Encounter: Payer: 59 | Attending: Registered Nurse | Admitting: Physical Medicine and Rehabilitation

## 2022-04-28 DIAGNOSIS — R4701 Aphasia: Secondary | ICD-10-CM | POA: Insufficient documentation

## 2022-04-28 DIAGNOSIS — I69151 Hemiplegia and hemiparesis following nontraumatic intracerebral hemorrhage affecting right dominant side: Secondary | ICD-10-CM | POA: Insufficient documentation

## 2022-04-28 DIAGNOSIS — I610 Nontraumatic intracerebral hemorrhage in hemisphere, subcortical: Secondary | ICD-10-CM | POA: Insufficient documentation

## 2022-04-28 DIAGNOSIS — R252 Cramp and spasm: Secondary | ICD-10-CM | POA: Insufficient documentation

## 2022-06-03 ENCOUNTER — Encounter: Payer: Self-pay | Admitting: *Deleted

## 2022-07-15 ENCOUNTER — Other Ambulatory Visit: Payer: Self-pay | Admitting: Physical Medicine and Rehabilitation

## 2022-07-15 ENCOUNTER — Other Ambulatory Visit: Payer: Self-pay | Admitting: Family

## 2022-07-15 DIAGNOSIS — I10 Essential (primary) hypertension: Secondary | ICD-10-CM

## 2022-08-02 ENCOUNTER — Other Ambulatory Visit: Payer: Self-pay | Admitting: Physical Medicine and Rehabilitation

## 2022-08-02 ENCOUNTER — Other Ambulatory Visit: Payer: Self-pay | Admitting: Family

## 2022-08-02 DIAGNOSIS — I10 Essential (primary) hypertension: Secondary | ICD-10-CM

## 2022-08-14 LAB — COMPREHENSIVE METABOLIC PANEL
ALT: 14 IU/L (ref 0–44)
AST: 23 IU/L (ref 0–40)
Albumin/Globulin Ratio: 2 (ref 1.2–2.2)
Albumin: 4.9 g/dL (ref 3.8–4.9)
Alkaline Phosphatase: 71 IU/L (ref 44–121)
BUN/Creatinine Ratio: 14 (ref 9–20)
BUN: 17 mg/dL (ref 6–24)
Bilirubin Total: 0.9 mg/dL (ref 0.0–1.2)
CO2: 24 mmol/L (ref 20–29)
Calcium: 9.8 mg/dL (ref 8.7–10.2)
Chloride: 100 mmol/L (ref 96–106)
Creatinine, Ser: 1.18 mg/dL (ref 0.76–1.27)
Globulin, Total: 2.4 g/dL (ref 1.5–4.5)
Glucose: 88 mg/dL (ref 70–99)
Potassium: 3.9 mmol/L (ref 3.5–5.2)
Sodium: 140 mmol/L (ref 134–144)
Total Protein: 7.3 g/dL (ref 6.0–8.5)
eGFR: 73 mL/min/{1.73_m2} (ref >59)
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
## 2022-10-27 ENCOUNTER — Other Ambulatory Visit: Payer: Self-pay | Admitting: Family

## 2022-10-27 DIAGNOSIS — I10 Essential (primary) hypertension: Secondary | ICD-10-CM

## 2023-03-14 IMAGING — CT CT HEAD W/O CM
4 series · 15 of 47 positions shown, 17 images · non-contrast
Comparison: CT from the previous day.

CLINICAL DATA: Follow-up stroke

EXAM:
CT HEAD WITHOUT CONTRAST
TECHNIQUE: Contiguous axial images were obtained from the base of the skull
through the vertex without intravenous contrast.

[Series 3: head wo · axial · 0.46mm/px · z∈[-132,-12]mm · 7 of 33 slices shown, 9 images]
[im 5/33  brain]
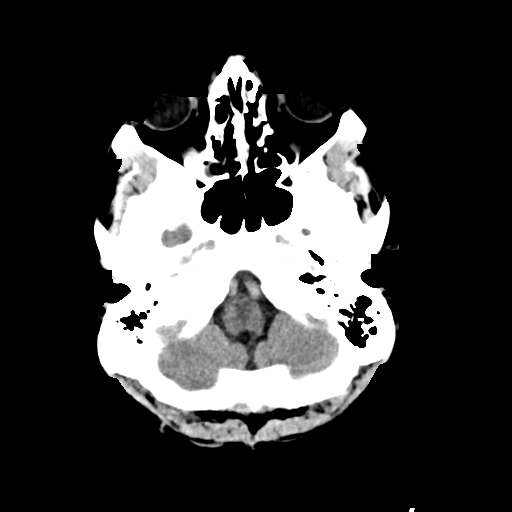
[im 5/33  bone]
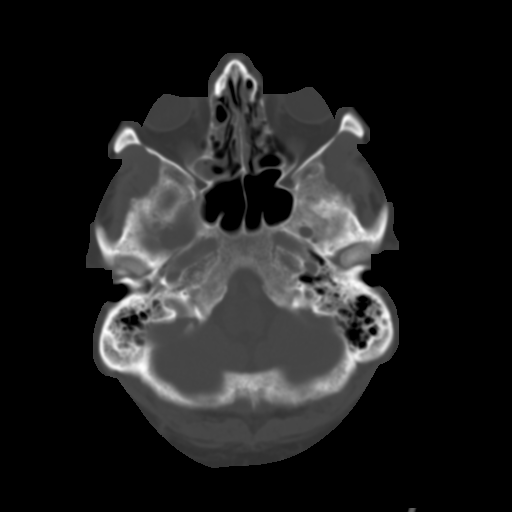
[im 9/33  brain]
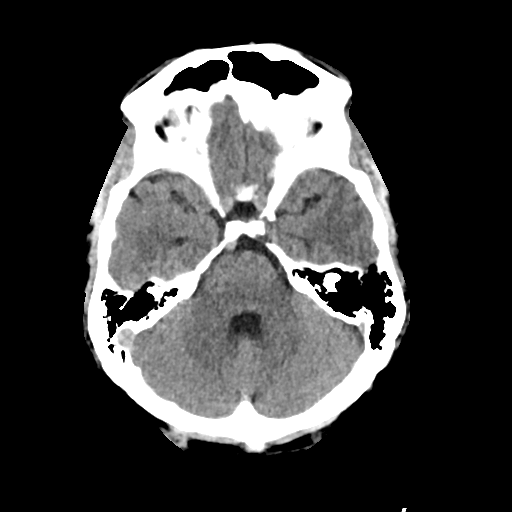
[im 13/33  brain]
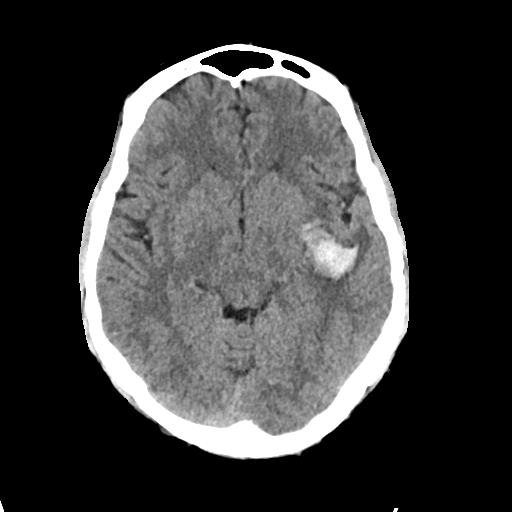
[im 17/33  brain]
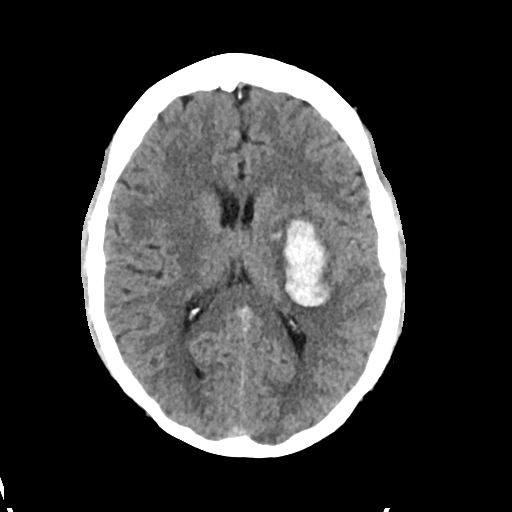
[im 21/33  brain]
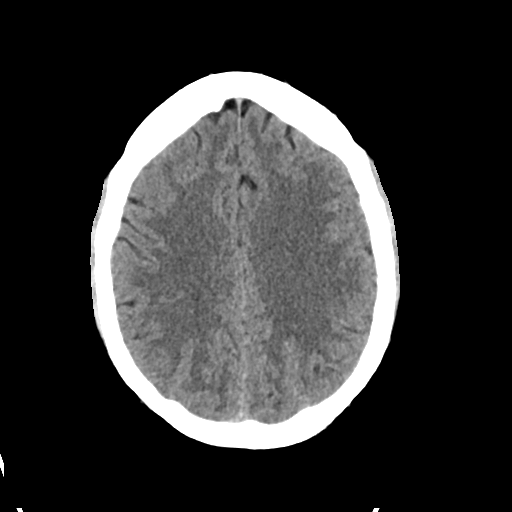
[im 21/33  bone]
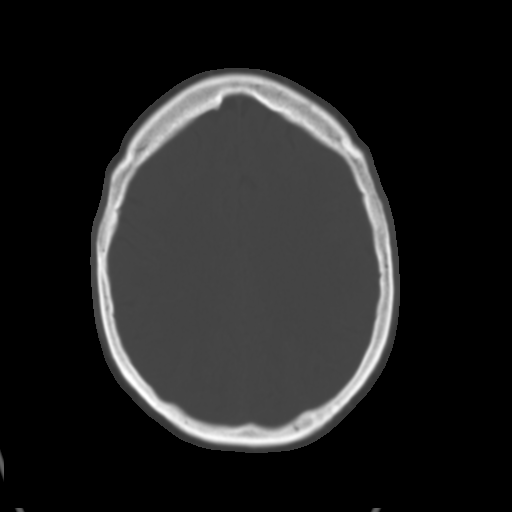
[im 25/33  brain]
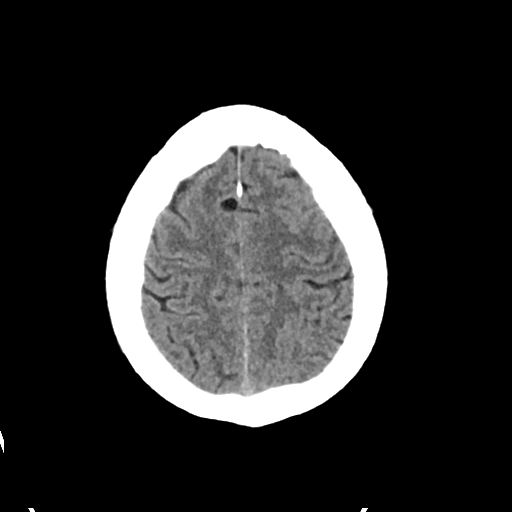
[im 29/33  brain]
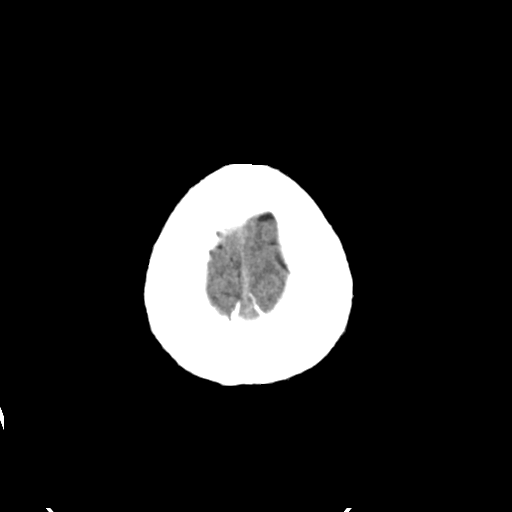

[Series 4: head bone · axial · 0.46mm/px · z∈[-136,-120]mm · 2 of 81 slices shown]
[im 9/81  bone]
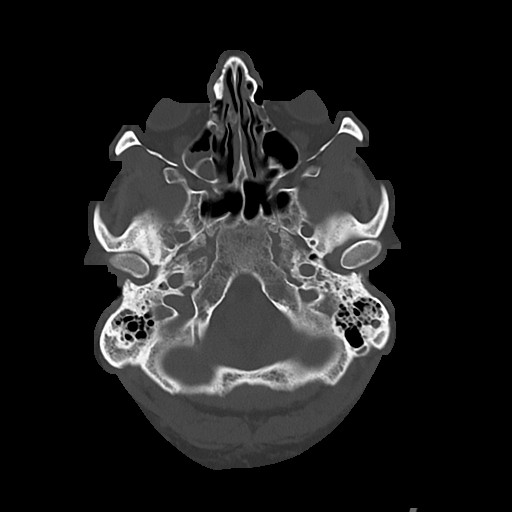
[im 17/81  bone]
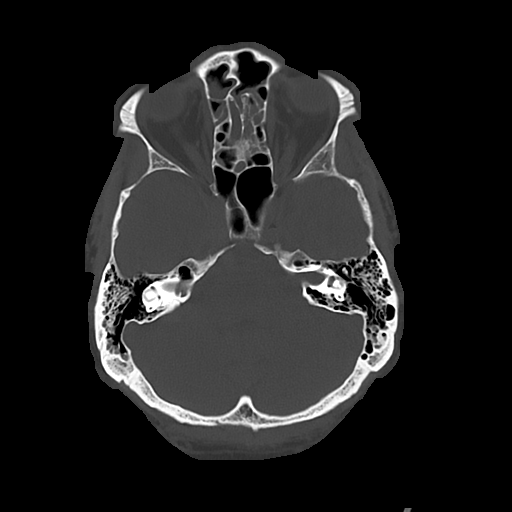

[Series 5: sag soft · sagittal · 0.33mm/px · 3 of 65 slices shown]
[im 22/65  brain]
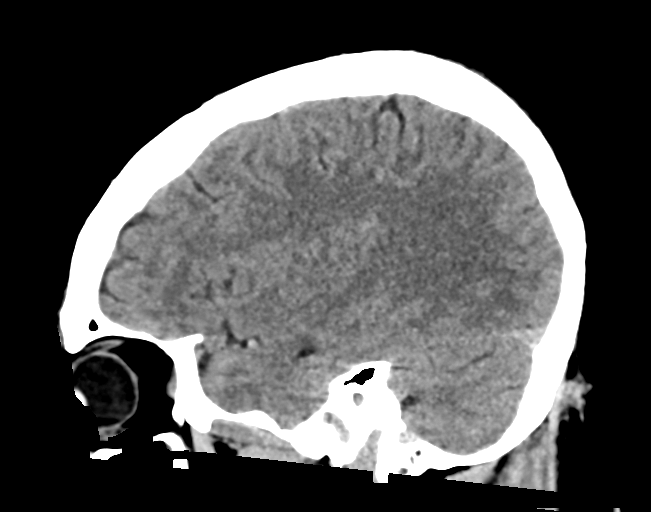
[im 33/65  brain]
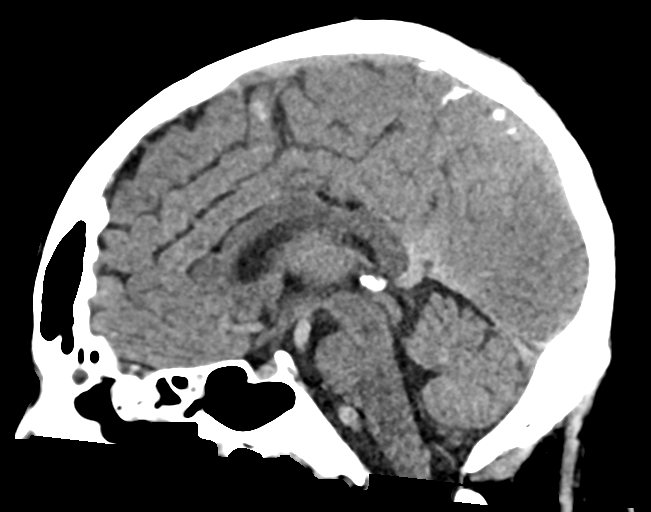
[im 43/65  brain]
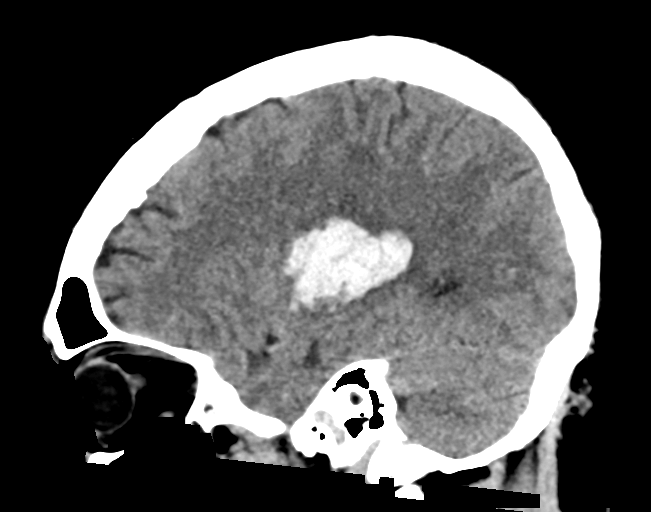

[Series 6: cor soft · coronal · 0.33mm/px · 3 of 72 slices shown]
[im 24/72  brain]
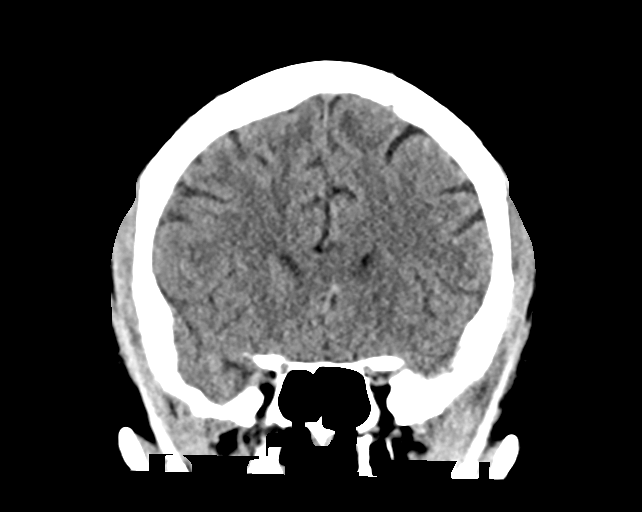
[im 32/72  brain]
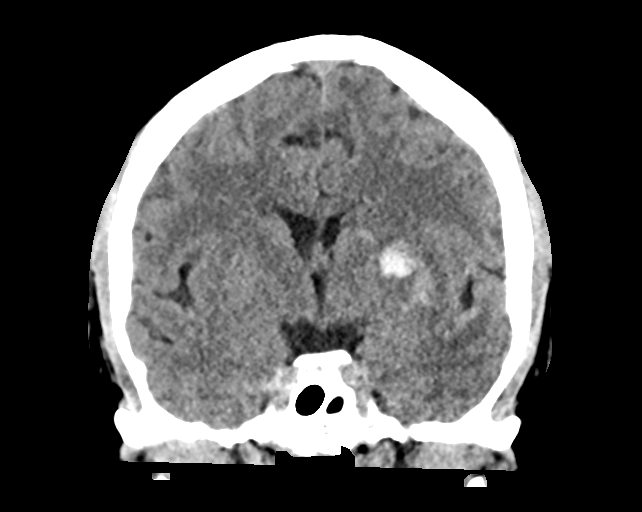
[im 40/72  brain]
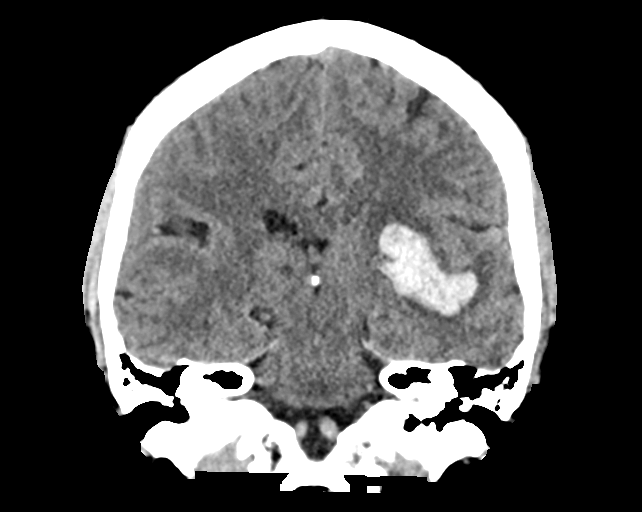

[15 of 47 positions shown; findings below may reference images not displayed]

FINDINGS: Brain: There are changes consistent with parenchymal hemorrhage in
the left parietal lobe deep white matter and extending into the
basal ganglia and superiorly into the corona radiata on the left.
The hematoma measures approximately 4 cm in greatest AP dimension.
It measures approximately 3.1 cm in transverse dimension although
evaluation is somewhat difficult due to the irregular shape of
hematoma. It extends approximately 3.4 cm in the cranial caudad
projection. No other focal areas of hemorrhage are seen. Mild mass
effect is noted with minimal midline shift of approximately 3 mm. No
other focal abnormality is noted.

Vascular: No hyperdense vessel or unexpected calcification.

Skull: Normal. Negative for fracture or focal lesion.

Sinuses/Orbits: Paranasal sinuses demonstrate mucosal thickening in
the ethmoid and frontal sinuses. Air-fluid level is noted in the
right maxillary antrum.

Other: None.
IMPRESSION: Parenchymal hemorrhage in the left parietal lobe and extending into
the left basal ganglia and corona radiata. Mild midline shift is
noted from local mass effect. The overall appearance is stable from
prior examination obtained under the name Jean Frandlay Petithomm earlier on
06/15/2021

Air-fluid level is again seen in the right maxillary antrum stable
from the prior exam.

These results will be called to the ordering clinician or
representative by the Radiologist Assistant, and communication
documented in the PACS or [REDACTED].
